# Patient Record
Sex: Female | Born: 1960 | Race: Black or African American | Hispanic: No | Marital: Married | Smoking: Former smoker
Health system: Southern US, Community
[De-identification: ages and names within clinical notes are randomized; demographics above are authoritative.]

## PROBLEM LIST (undated history)

## (undated) DIAGNOSIS — R569 Unspecified convulsions: Secondary | ICD-10-CM

## (undated) DIAGNOSIS — E119 Type 2 diabetes mellitus without complications: Secondary | ICD-10-CM

## (undated) DIAGNOSIS — I10 Essential (primary) hypertension: Secondary | ICD-10-CM

## (undated) DIAGNOSIS — E079 Disorder of thyroid, unspecified: Secondary | ICD-10-CM

## (undated) HISTORY — DX: Type 2 diabetes mellitus without complications: E11.9

---

## 1998-06-16 ENCOUNTER — Emergency Department (HOSPITAL_COMMUNITY): Admission: EM | Admit: 1998-06-16 | Discharge: 1998-06-16 | Payer: Self-pay | Admitting: Internal Medicine

## 1998-06-19 ENCOUNTER — Emergency Department (HOSPITAL_COMMUNITY): Admission: EM | Admit: 1998-06-19 | Discharge: 1998-06-19 | Payer: Self-pay | Admitting: Emergency Medicine

## 1999-07-11 ENCOUNTER — Encounter: Payer: Self-pay | Admitting: Internal Medicine

## 1999-07-11 ENCOUNTER — Emergency Department (HOSPITAL_COMMUNITY): Admission: EM | Admit: 1999-07-11 | Discharge: 1999-07-12 | Payer: Self-pay | Admitting: Internal Medicine

## 1999-09-24 ENCOUNTER — Emergency Department (HOSPITAL_COMMUNITY): Admission: EM | Admit: 1999-09-24 | Discharge: 1999-09-24 | Payer: Self-pay | Admitting: Emergency Medicine

## 1999-09-24 ENCOUNTER — Encounter: Payer: Self-pay | Admitting: Emergency Medicine

## 1999-10-23 ENCOUNTER — Emergency Department (HOSPITAL_COMMUNITY): Admission: EM | Admit: 1999-10-23 | Discharge: 1999-10-23 | Payer: Self-pay | Admitting: Emergency Medicine

## 2000-03-16 ENCOUNTER — Emergency Department (HOSPITAL_COMMUNITY): Admission: EM | Admit: 2000-03-16 | Discharge: 2000-03-17 | Payer: Self-pay | Admitting: *Deleted

## 2000-03-17 ENCOUNTER — Encounter: Payer: Self-pay | Admitting: Emergency Medicine

## 2000-11-12 ENCOUNTER — Emergency Department (HOSPITAL_COMMUNITY): Admission: EM | Admit: 2000-11-12 | Discharge: 2000-11-12 | Payer: Self-pay | Admitting: Emergency Medicine

## 2000-11-12 ENCOUNTER — Encounter: Payer: Self-pay | Admitting: Emergency Medicine

## 2001-01-30 ENCOUNTER — Emergency Department (HOSPITAL_COMMUNITY): Admission: EM | Admit: 2001-01-30 | Discharge: 2001-01-30 | Payer: Self-pay | Admitting: Emergency Medicine

## 2001-01-30 ENCOUNTER — Encounter: Payer: Self-pay | Admitting: Emergency Medicine

## 2002-01-07 ENCOUNTER — Encounter: Payer: Self-pay | Admitting: Emergency Medicine

## 2002-01-07 ENCOUNTER — Emergency Department (HOSPITAL_COMMUNITY): Admission: EM | Admit: 2002-01-07 | Discharge: 2002-01-07 | Payer: Self-pay | Admitting: Emergency Medicine

## 2002-01-22 ENCOUNTER — Encounter: Admission: RE | Admit: 2002-01-22 | Discharge: 2002-01-22 | Payer: Self-pay | Admitting: Family Medicine

## 2002-08-31 ENCOUNTER — Emergency Department (HOSPITAL_COMMUNITY): Admission: EM | Admit: 2002-08-31 | Discharge: 2002-08-31 | Payer: Self-pay | Admitting: Emergency Medicine

## 2002-09-02 ENCOUNTER — Emergency Department (HOSPITAL_COMMUNITY): Admission: EM | Admit: 2002-09-02 | Discharge: 2002-09-02 | Payer: Self-pay | Admitting: Emergency Medicine

## 2002-09-07 ENCOUNTER — Encounter: Admission: RE | Admit: 2002-09-07 | Discharge: 2002-09-07 | Payer: Self-pay | Admitting: Family Medicine

## 2002-10-22 ENCOUNTER — Encounter: Admission: RE | Admit: 2002-10-22 | Discharge: 2002-10-22 | Payer: Self-pay | Admitting: Family Medicine

## 2002-10-25 ENCOUNTER — Encounter: Admission: RE | Admit: 2002-10-25 | Discharge: 2002-10-25 | Payer: Self-pay | Admitting: Family Medicine

## 2002-12-24 ENCOUNTER — Encounter: Admission: RE | Admit: 2002-12-24 | Discharge: 2002-12-24 | Payer: Self-pay | Admitting: Family Medicine

## 2002-12-31 ENCOUNTER — Encounter (INDEPENDENT_AMBULATORY_CARE_PROVIDER_SITE_OTHER): Payer: Self-pay | Admitting: *Deleted

## 2002-12-31 ENCOUNTER — Other Ambulatory Visit: Admission: RE | Admit: 2002-12-31 | Discharge: 2002-12-31 | Payer: Self-pay | Admitting: Family Medicine

## 2002-12-31 ENCOUNTER — Encounter: Admission: RE | Admit: 2002-12-31 | Discharge: 2002-12-31 | Payer: Self-pay | Admitting: Family Medicine

## 2003-01-06 ENCOUNTER — Emergency Department (HOSPITAL_COMMUNITY): Admission: EM | Admit: 2003-01-06 | Discharge: 2003-01-06 | Payer: Self-pay | Admitting: Emergency Medicine

## 2003-01-06 ENCOUNTER — Encounter: Payer: Self-pay | Admitting: Emergency Medicine

## 2003-01-06 ENCOUNTER — Encounter: Admission: RE | Admit: 2003-01-06 | Discharge: 2003-01-06 | Payer: Self-pay | Admitting: Sports Medicine

## 2003-01-06 ENCOUNTER — Encounter: Payer: Self-pay | Admitting: Sports Medicine

## 2003-02-15 ENCOUNTER — Encounter: Admission: RE | Admit: 2003-02-15 | Discharge: 2003-02-15 | Payer: Self-pay | Admitting: Family Medicine

## 2003-03-08 ENCOUNTER — Encounter: Admission: RE | Admit: 2003-03-08 | Discharge: 2003-03-08 | Payer: Self-pay | Admitting: Family Medicine

## 2003-09-08 ENCOUNTER — Emergency Department (HOSPITAL_COMMUNITY): Admission: EM | Admit: 2003-09-08 | Discharge: 2003-09-08 | Payer: Self-pay | Admitting: Emergency Medicine

## 2003-09-08 ENCOUNTER — Encounter: Payer: Self-pay | Admitting: Emergency Medicine

## 2003-09-13 ENCOUNTER — Encounter: Admission: RE | Admit: 2003-09-13 | Discharge: 2003-09-13 | Payer: Self-pay | Admitting: Family Medicine

## 2004-01-04 ENCOUNTER — Emergency Department (HOSPITAL_COMMUNITY): Admission: EM | Admit: 2004-01-04 | Discharge: 2004-01-04 | Payer: Self-pay | Admitting: Emergency Medicine

## 2004-03-06 ENCOUNTER — Encounter: Admission: RE | Admit: 2004-03-06 | Discharge: 2004-03-06 | Payer: Self-pay | Admitting: Family Medicine

## 2004-03-23 ENCOUNTER — Emergency Department (HOSPITAL_COMMUNITY): Admission: EM | Admit: 2004-03-23 | Discharge: 2004-03-24 | Payer: Self-pay | Admitting: Emergency Medicine

## 2004-03-27 ENCOUNTER — Encounter: Admission: RE | Admit: 2004-03-27 | Discharge: 2004-03-27 | Payer: Self-pay | Admitting: Sports Medicine

## 2004-04-17 ENCOUNTER — Encounter: Admission: RE | Admit: 2004-04-17 | Discharge: 2004-05-31 | Payer: Self-pay | Admitting: Family Medicine

## 2004-08-14 ENCOUNTER — Emergency Department (HOSPITAL_COMMUNITY): Admission: EM | Admit: 2004-08-14 | Discharge: 2004-08-14 | Payer: Self-pay | Admitting: Emergency Medicine

## 2004-08-24 ENCOUNTER — Emergency Department (HOSPITAL_COMMUNITY): Admission: EM | Admit: 2004-08-24 | Discharge: 2004-08-24 | Payer: Self-pay | Admitting: Emergency Medicine

## 2004-09-25 ENCOUNTER — Ambulatory Visit: Payer: Self-pay | Admitting: Family Medicine

## 2004-09-25 ENCOUNTER — Other Ambulatory Visit: Admission: RE | Admit: 2004-09-25 | Discharge: 2004-09-25 | Payer: Self-pay | Admitting: Family Medicine

## 2004-12-25 ENCOUNTER — Ambulatory Visit: Payer: Self-pay | Admitting: Family Medicine

## 2005-02-03 ENCOUNTER — Emergency Department (HOSPITAL_COMMUNITY): Admission: EM | Admit: 2005-02-03 | Discharge: 2005-02-03 | Payer: Self-pay | Admitting: Emergency Medicine

## 2005-02-26 ENCOUNTER — Ambulatory Visit: Payer: Self-pay | Admitting: Family Medicine

## 2005-03-12 ENCOUNTER — Ambulatory Visit: Payer: Self-pay | Admitting: Family Medicine

## 2005-03-12 ENCOUNTER — Encounter: Admission: RE | Admit: 2005-03-12 | Discharge: 2005-03-12 | Payer: Self-pay | Admitting: Family Medicine

## 2005-04-03 ENCOUNTER — Encounter: Admission: RE | Admit: 2005-04-03 | Discharge: 2005-05-03 | Payer: Self-pay | Admitting: Family Medicine

## 2005-04-04 ENCOUNTER — Emergency Department (HOSPITAL_COMMUNITY): Admission: EM | Admit: 2005-04-04 | Discharge: 2005-04-04 | Payer: Self-pay | Admitting: Emergency Medicine

## 2005-04-08 ENCOUNTER — Emergency Department (HOSPITAL_COMMUNITY): Admission: EM | Admit: 2005-04-08 | Discharge: 2005-04-08 | Payer: Self-pay | Admitting: *Deleted

## 2005-07-09 ENCOUNTER — Emergency Department (HOSPITAL_COMMUNITY): Admission: EM | Admit: 2005-07-09 | Discharge: 2005-07-10 | Payer: Self-pay | Admitting: Emergency Medicine

## 2005-07-10 ENCOUNTER — Ambulatory Visit: Payer: Self-pay | Admitting: Family Medicine

## 2005-07-26 ENCOUNTER — Emergency Department (HOSPITAL_COMMUNITY): Admission: EM | Admit: 2005-07-26 | Discharge: 2005-07-26 | Payer: Self-pay | Admitting: Emergency Medicine

## 2005-08-02 ENCOUNTER — Ambulatory Visit: Payer: Self-pay | Admitting: Sports Medicine

## 2005-08-08 ENCOUNTER — Ambulatory Visit: Payer: Self-pay | Admitting: Family Medicine

## 2005-08-13 ENCOUNTER — Encounter: Admission: RE | Admit: 2005-08-13 | Discharge: 2005-08-13 | Payer: Self-pay | Admitting: Sports Medicine

## 2005-10-22 ENCOUNTER — Ambulatory Visit: Payer: Self-pay | Admitting: Family Medicine

## 2005-11-06 ENCOUNTER — Encounter (INDEPENDENT_AMBULATORY_CARE_PROVIDER_SITE_OTHER): Payer: Self-pay | Admitting: *Deleted

## 2005-11-06 LAB — CONVERTED CEMR LAB

## 2005-11-18 ENCOUNTER — Other Ambulatory Visit: Admission: RE | Admit: 2005-11-18 | Discharge: 2005-11-18 | Payer: Self-pay | Admitting: Family Medicine

## 2005-11-18 ENCOUNTER — Encounter (INDEPENDENT_AMBULATORY_CARE_PROVIDER_SITE_OTHER): Payer: Self-pay | Admitting: *Deleted

## 2005-11-18 ENCOUNTER — Ambulatory Visit: Payer: Self-pay | Admitting: Family Medicine

## 2006-02-16 ENCOUNTER — Emergency Department (HOSPITAL_COMMUNITY): Admission: EM | Admit: 2006-02-16 | Discharge: 2006-02-16 | Payer: Self-pay | Admitting: Emergency Medicine

## 2006-03-11 ENCOUNTER — Ambulatory Visit: Payer: Self-pay | Admitting: Sports Medicine

## 2006-07-07 ENCOUNTER — Ambulatory Visit: Payer: Self-pay | Admitting: Sports Medicine

## 2006-07-15 ENCOUNTER — Encounter (HOSPITAL_COMMUNITY): Admission: RE | Admit: 2006-07-15 | Discharge: 2006-08-15 | Payer: Self-pay | Admitting: Family Medicine

## 2006-08-01 ENCOUNTER — Emergency Department (HOSPITAL_COMMUNITY): Admission: EM | Admit: 2006-08-01 | Discharge: 2006-08-01 | Payer: Self-pay | Admitting: Emergency Medicine

## 2006-08-06 ENCOUNTER — Ambulatory Visit: Payer: Self-pay | Admitting: Family Medicine

## 2006-09-15 ENCOUNTER — Ambulatory Visit (HOSPITAL_BASED_OUTPATIENT_CLINIC_OR_DEPARTMENT_OTHER): Admission: RE | Admit: 2006-09-15 | Discharge: 2006-09-15 | Payer: Self-pay | Admitting: Surgery

## 2006-09-15 ENCOUNTER — Encounter (INDEPENDENT_AMBULATORY_CARE_PROVIDER_SITE_OTHER): Payer: Self-pay | Admitting: *Deleted

## 2006-10-03 ENCOUNTER — Ambulatory Visit (HOSPITAL_COMMUNITY): Admission: RE | Admit: 2006-10-03 | Discharge: 2006-10-03 | Payer: Self-pay | Admitting: Specialist

## 2006-10-03 ENCOUNTER — Encounter (INDEPENDENT_AMBULATORY_CARE_PROVIDER_SITE_OTHER): Payer: Self-pay | Admitting: *Deleted

## 2006-11-23 ENCOUNTER — Emergency Department (HOSPITAL_COMMUNITY): Admission: EM | Admit: 2006-11-23 | Discharge: 2006-11-23 | Payer: Self-pay | Admitting: Family Medicine

## 2007-01-29 DIAGNOSIS — E669 Obesity, unspecified: Secondary | ICD-10-CM | POA: Insufficient documentation

## 2007-01-30 ENCOUNTER — Encounter (INDEPENDENT_AMBULATORY_CARE_PROVIDER_SITE_OTHER): Payer: Self-pay | Admitting: *Deleted

## 2007-05-27 ENCOUNTER — Emergency Department (HOSPITAL_COMMUNITY): Admission: EM | Admit: 2007-05-27 | Discharge: 2007-05-27 | Payer: Self-pay | Admitting: Emergency Medicine

## 2007-09-01 ENCOUNTER — Emergency Department (HOSPITAL_COMMUNITY): Admission: EM | Admit: 2007-09-01 | Discharge: 2007-09-01 | Payer: Self-pay | Admitting: Emergency Medicine

## 2007-12-03 HISTORY — PX: OTHER SURGICAL HISTORY: SHX169

## 2007-12-25 ENCOUNTER — Emergency Department (HOSPITAL_COMMUNITY): Admission: EM | Admit: 2007-12-25 | Discharge: 2007-12-25 | Payer: Self-pay | Admitting: Emergency Medicine

## 2008-03-04 ENCOUNTER — Telehealth: Payer: Self-pay | Admitting: *Deleted

## 2008-03-07 ENCOUNTER — Encounter: Payer: Self-pay | Admitting: *Deleted

## 2008-03-08 ENCOUNTER — Encounter: Payer: Self-pay | Admitting: Family Medicine

## 2008-03-08 ENCOUNTER — Ambulatory Visit: Payer: Self-pay | Admitting: Sports Medicine

## 2008-03-08 LAB — CONVERTED CEMR LAB

## 2008-03-09 ENCOUNTER — Inpatient Hospital Stay (HOSPITAL_COMMUNITY): Admission: AD | Admit: 2008-03-09 | Discharge: 2008-03-10 | Payer: Self-pay | Admitting: Family Medicine

## 2008-03-09 ENCOUNTER — Telehealth: Payer: Self-pay | Admitting: Family Medicine

## 2008-03-09 ENCOUNTER — Ambulatory Visit: Payer: Self-pay | Admitting: Family Medicine

## 2008-03-09 ENCOUNTER — Telehealth: Payer: Self-pay | Admitting: *Deleted

## 2008-03-09 LAB — CONVERTED CEMR LAB
MCV: 57.6 fL — ABNORMAL LOW (ref 78.0–100.0)
RBC: 3.49 M/uL — ABNORMAL LOW (ref 3.87–5.11)
WBC: 7.8 10*3/uL (ref 4.0–10.5)

## 2008-03-10 ENCOUNTER — Encounter: Payer: Self-pay | Admitting: Family Medicine

## 2008-03-14 ENCOUNTER — Encounter: Payer: Self-pay | Admitting: *Deleted

## 2008-03-14 ENCOUNTER — Emergency Department (HOSPITAL_COMMUNITY): Admission: EM | Admit: 2008-03-14 | Discharge: 2008-03-15 | Payer: Self-pay | Admitting: Emergency Medicine

## 2008-03-16 ENCOUNTER — Ambulatory Visit: Payer: Self-pay | Admitting: Family Medicine

## 2008-04-08 ENCOUNTER — Encounter: Payer: Self-pay | Admitting: *Deleted

## 2008-05-02 ENCOUNTER — Telehealth: Payer: Self-pay | Admitting: *Deleted

## 2008-05-03 ENCOUNTER — Encounter: Payer: Self-pay | Admitting: Family Medicine

## 2008-05-03 ENCOUNTER — Telehealth: Payer: Self-pay | Admitting: Family Medicine

## 2008-05-04 ENCOUNTER — Inpatient Hospital Stay (HOSPITAL_COMMUNITY): Admission: AD | Admit: 2008-05-04 | Discharge: 2008-05-04 | Payer: Self-pay | Admitting: Gynecology

## 2008-05-16 ENCOUNTER — Emergency Department (HOSPITAL_COMMUNITY): Admission: EM | Admit: 2008-05-16 | Discharge: 2008-05-16 | Payer: Self-pay | Admitting: Emergency Medicine

## 2008-06-13 ENCOUNTER — Telehealth: Payer: Self-pay | Admitting: Family Medicine

## 2008-06-14 ENCOUNTER — Inpatient Hospital Stay (HOSPITAL_COMMUNITY): Admission: AD | Admit: 2008-06-14 | Discharge: 2008-06-14 | Payer: Self-pay | Admitting: Gynecology

## 2008-06-28 ENCOUNTER — Telehealth: Payer: Self-pay | Admitting: *Deleted

## 2008-06-29 ENCOUNTER — Ambulatory Visit: Payer: Self-pay | Admitting: Family Medicine

## 2008-06-29 LAB — CONVERTED CEMR LAB: Hemoglobin: 12.3 g/dL

## 2008-08-03 ENCOUNTER — Ambulatory Visit: Payer: Self-pay | Admitting: Obstetrics & Gynecology

## 2008-08-03 ENCOUNTER — Other Ambulatory Visit: Admission: RE | Admit: 2008-08-03 | Discharge: 2008-08-03 | Payer: Self-pay | Admitting: Obstetrics and Gynecology

## 2008-08-11 ENCOUNTER — Telehealth: Payer: Self-pay | Admitting: *Deleted

## 2008-08-18 ENCOUNTER — Encounter: Payer: Self-pay | Admitting: Obstetrics and Gynecology

## 2008-08-18 ENCOUNTER — Ambulatory Visit: Payer: Self-pay | Admitting: Obstetrics and Gynecology

## 2008-08-24 ENCOUNTER — Ambulatory Visit (HOSPITAL_COMMUNITY): Admission: RE | Admit: 2008-08-24 | Discharge: 2008-08-24 | Payer: Self-pay | Admitting: Obstetrics & Gynecology

## 2008-09-08 ENCOUNTER — Encounter: Payer: Self-pay | Admitting: Family Medicine

## 2008-09-16 ENCOUNTER — Ambulatory Visit: Payer: Self-pay | Admitting: Family Medicine

## 2008-09-16 ENCOUNTER — Encounter (INDEPENDENT_AMBULATORY_CARE_PROVIDER_SITE_OTHER): Payer: Self-pay | Admitting: *Deleted

## 2008-10-03 ENCOUNTER — Ambulatory Visit (HOSPITAL_COMMUNITY): Admission: RE | Admit: 2008-10-03 | Discharge: 2008-10-03 | Payer: Self-pay | Admitting: Obstetrics & Gynecology

## 2008-10-03 ENCOUNTER — Ambulatory Visit: Payer: Self-pay | Admitting: Obstetrics & Gynecology

## 2008-11-02 ENCOUNTER — Ambulatory Visit: Payer: Self-pay | Admitting: Obstetrics and Gynecology

## 2008-12-02 HISTORY — PX: PARATHYROIDECTOMY: SHX19

## 2008-12-02 HISTORY — PX: TOTAL THYROIDECTOMY: SHX2547

## 2008-12-25 ENCOUNTER — Emergency Department (HOSPITAL_COMMUNITY): Admission: EM | Admit: 2008-12-25 | Discharge: 2008-12-25 | Payer: Self-pay | Admitting: Family Medicine

## 2008-12-27 ENCOUNTER — Telehealth: Payer: Self-pay | Admitting: *Deleted

## 2008-12-28 ENCOUNTER — Ambulatory Visit: Payer: Self-pay | Admitting: Family Medicine

## 2008-12-28 ENCOUNTER — Encounter (INDEPENDENT_AMBULATORY_CARE_PROVIDER_SITE_OTHER): Payer: Self-pay | Admitting: Family Medicine

## 2008-12-28 LAB — CONVERTED CEMR LAB
Basophils Absolute: 0 10*3/uL (ref 0.0–0.1)
CRP, High Sensitivity: 0.6
Eosinophils Relative: 0 % (ref 0–5)
HCT: 38.5 % (ref 36.0–46.0)
Hemoglobin: 13.1 g/dL (ref 12.0–15.0)
Lymphocytes Relative: 29 % (ref 12–46)
Lymphs Abs: 2.7 10*3/uL (ref 0.7–4.0)
Monocytes Absolute: 0.6 10*3/uL (ref 0.1–1.0)
Monocytes Relative: 6 % (ref 3–12)
Neutro Abs: 6.1 10*3/uL (ref 1.7–7.7)
RBC: 5.11 M/uL (ref 3.87–5.11)
RDW: 19.2 % — ABNORMAL HIGH (ref 11.5–15.5)

## 2008-12-30 ENCOUNTER — Telehealth: Payer: Self-pay | Admitting: *Deleted

## 2009-01-06 ENCOUNTER — Ambulatory Visit: Payer: Self-pay | Admitting: Family Medicine

## 2009-01-06 ENCOUNTER — Encounter: Admission: RE | Admit: 2009-01-06 | Discharge: 2009-01-06 | Payer: Self-pay | Admitting: Family Medicine

## 2009-01-06 DIAGNOSIS — I10 Essential (primary) hypertension: Secondary | ICD-10-CM | POA: Insufficient documentation

## 2009-01-20 ENCOUNTER — Telehealth (INDEPENDENT_AMBULATORY_CARE_PROVIDER_SITE_OTHER): Payer: Self-pay | Admitting: *Deleted

## 2009-01-26 ENCOUNTER — Telehealth: Payer: Self-pay | Admitting: Family Medicine

## 2009-01-27 ENCOUNTER — Ambulatory Visit: Payer: Self-pay | Admitting: Family Medicine

## 2009-02-24 ENCOUNTER — Encounter: Payer: Self-pay | Admitting: Family Medicine

## 2009-02-24 ENCOUNTER — Ambulatory Visit: Payer: Self-pay | Admitting: Family Medicine

## 2009-02-28 ENCOUNTER — Encounter: Payer: Self-pay | Admitting: Family Medicine

## 2009-02-28 LAB — CONVERTED CEMR LAB
ALT: 12 units/L (ref 0–35)
Albumin: 4.3 g/dL (ref 3.5–5.2)
CO2: 23 meq/L (ref 19–32)
Calcium: 11.7 mg/dL — ABNORMAL HIGH (ref 8.4–10.5)
Chloride: 107 meq/L (ref 96–112)
Cholesterol: 128 mg/dL (ref 0–200)
Glucose, Bld: 114 mg/dL — ABNORMAL HIGH (ref 70–99)
Potassium: 3.2 meq/L — ABNORMAL LOW (ref 3.5–5.3)
Sodium: 142 meq/L (ref 135–145)
Total Protein: 7.3 g/dL (ref 6.0–8.3)
Triglycerides: 72 mg/dL (ref <150)
VLDL: 14 mg/dL (ref 0–40)

## 2009-03-01 ENCOUNTER — Ambulatory Visit: Payer: Self-pay | Admitting: Sports Medicine

## 2009-04-07 ENCOUNTER — Telehealth (INDEPENDENT_AMBULATORY_CARE_PROVIDER_SITE_OTHER): Payer: Self-pay | Admitting: *Deleted

## 2009-04-08 ENCOUNTER — Emergency Department (HOSPITAL_COMMUNITY): Admission: EM | Admit: 2009-04-08 | Discharge: 2009-04-08 | Payer: Self-pay | Admitting: Emergency Medicine

## 2009-04-28 ENCOUNTER — Encounter: Payer: Self-pay | Admitting: Family Medicine

## 2009-04-28 ENCOUNTER — Ambulatory Visit: Payer: Self-pay | Admitting: Family Medicine

## 2009-05-04 LAB — CONVERTED CEMR LAB
BUN: 4 mg/dL — ABNORMAL LOW (ref 6–23)
CO2: 24 meq/L (ref 19–32)
Chloride: 109 meq/L (ref 96–112)
Creatinine, Ser: 0.77 mg/dL (ref 0.40–1.20)
Potassium: 3.8 meq/L (ref 3.5–5.3)

## 2009-07-14 ENCOUNTER — Ambulatory Visit: Payer: Self-pay | Admitting: Family Medicine

## 2009-09-12 ENCOUNTER — Telehealth: Payer: Self-pay | Admitting: Family Medicine

## 2009-09-14 ENCOUNTER — Emergency Department (HOSPITAL_COMMUNITY): Admission: EM | Admit: 2009-09-14 | Discharge: 2009-09-14 | Payer: Self-pay | Admitting: Emergency Medicine

## 2009-09-18 ENCOUNTER — Ambulatory Visit: Payer: Self-pay | Admitting: Family Medicine

## 2009-09-22 ENCOUNTER — Ambulatory Visit: Payer: Self-pay | Admitting: Family Medicine

## 2010-01-13 ENCOUNTER — Emergency Department (HOSPITAL_COMMUNITY): Admission: EM | Admit: 2010-01-13 | Discharge: 2010-01-13 | Payer: Self-pay | Admitting: Emergency Medicine

## 2010-01-15 ENCOUNTER — Telehealth: Payer: Self-pay | Admitting: Family Medicine

## 2010-02-13 IMAGING — CR DG ANKLE COMPLETE 3+V*L*
4 series · 4 of 4 positions shown · non-contrast
Comparison: None

CLINICAL DATA: Left ankle pain post twisting injury

LEFT ANKLE COMPLETE - 3+ VIEW

[t ankle joint ap left]
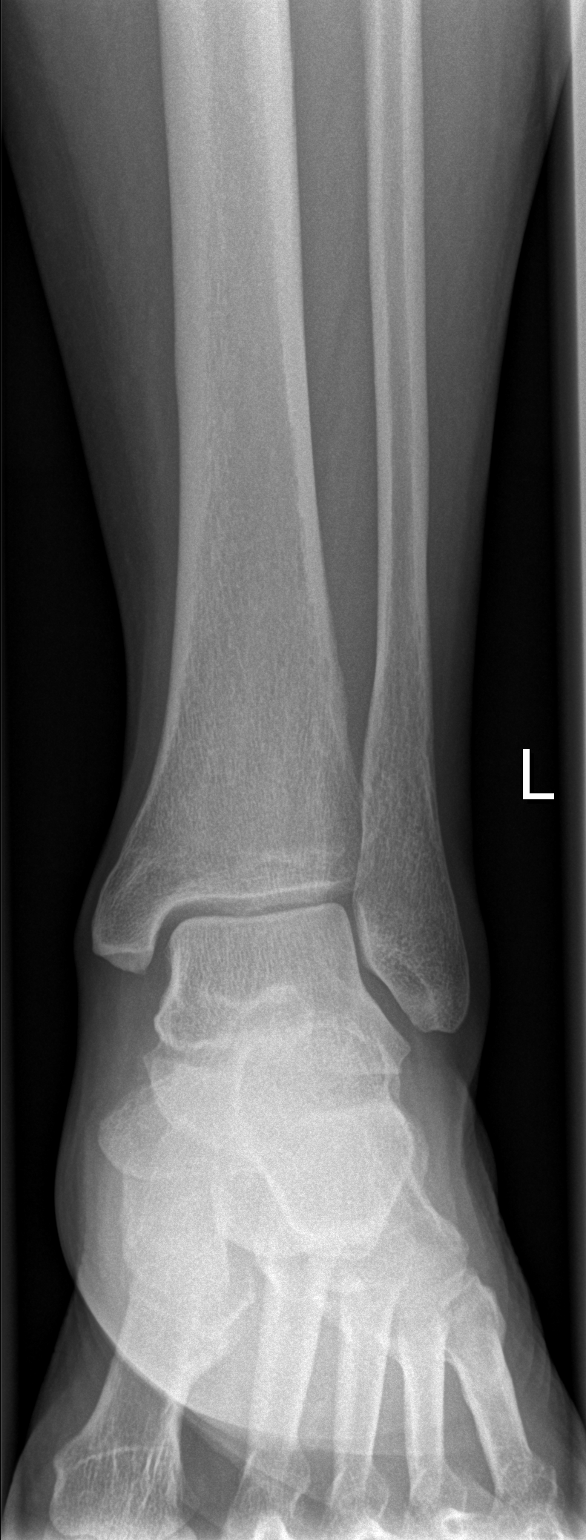

[t ankle joint oblique left]
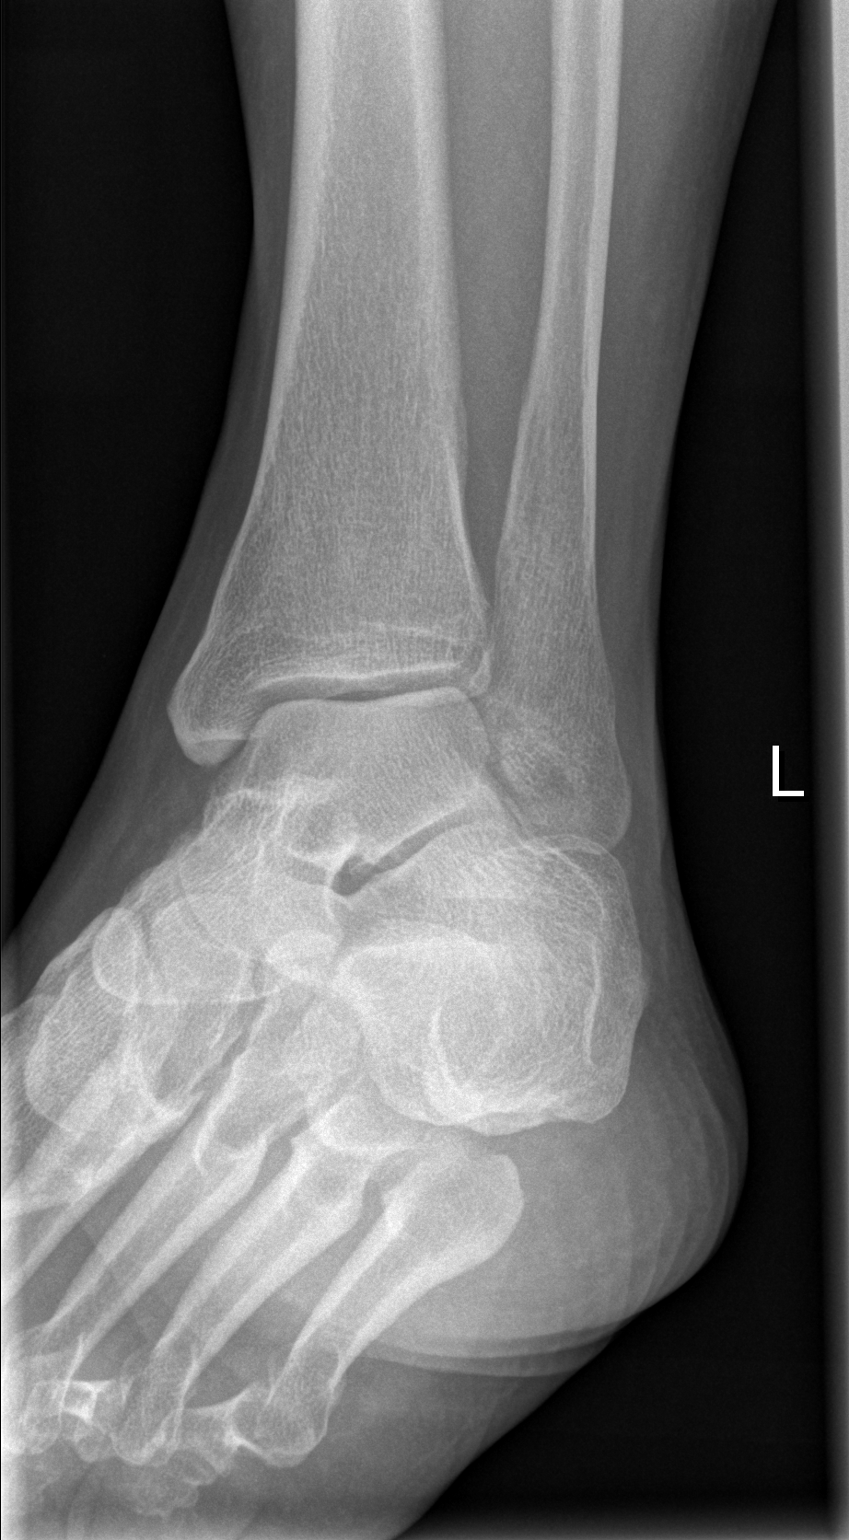

[t ankle joint lat left (1 of 2)]
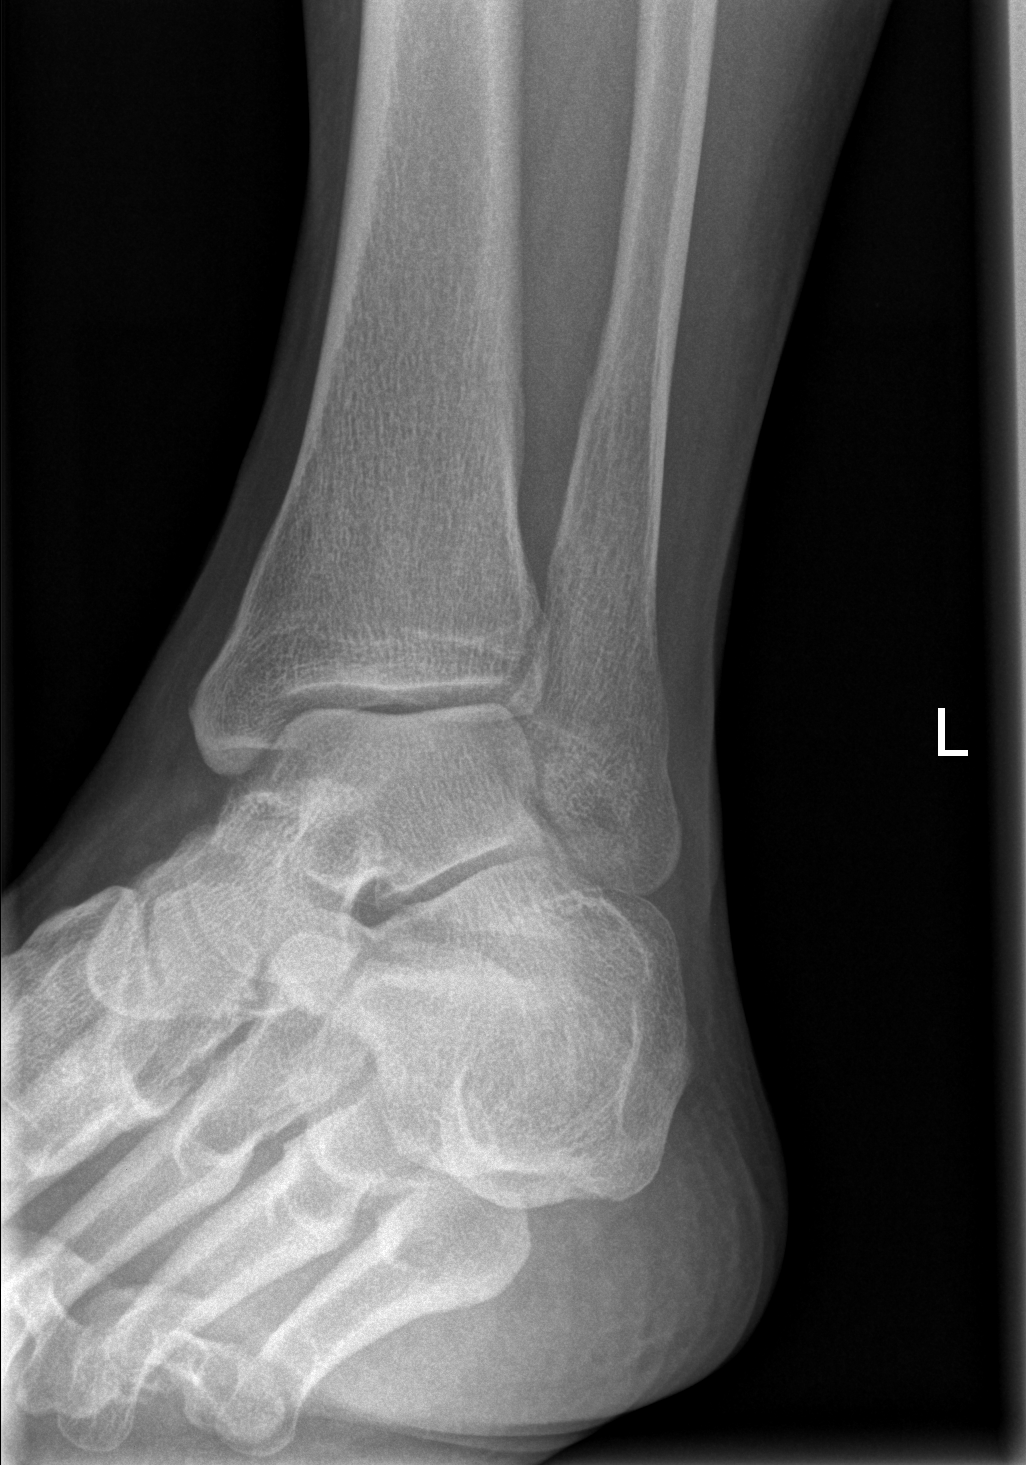

[t ankle joint lat left (2 of 2)]
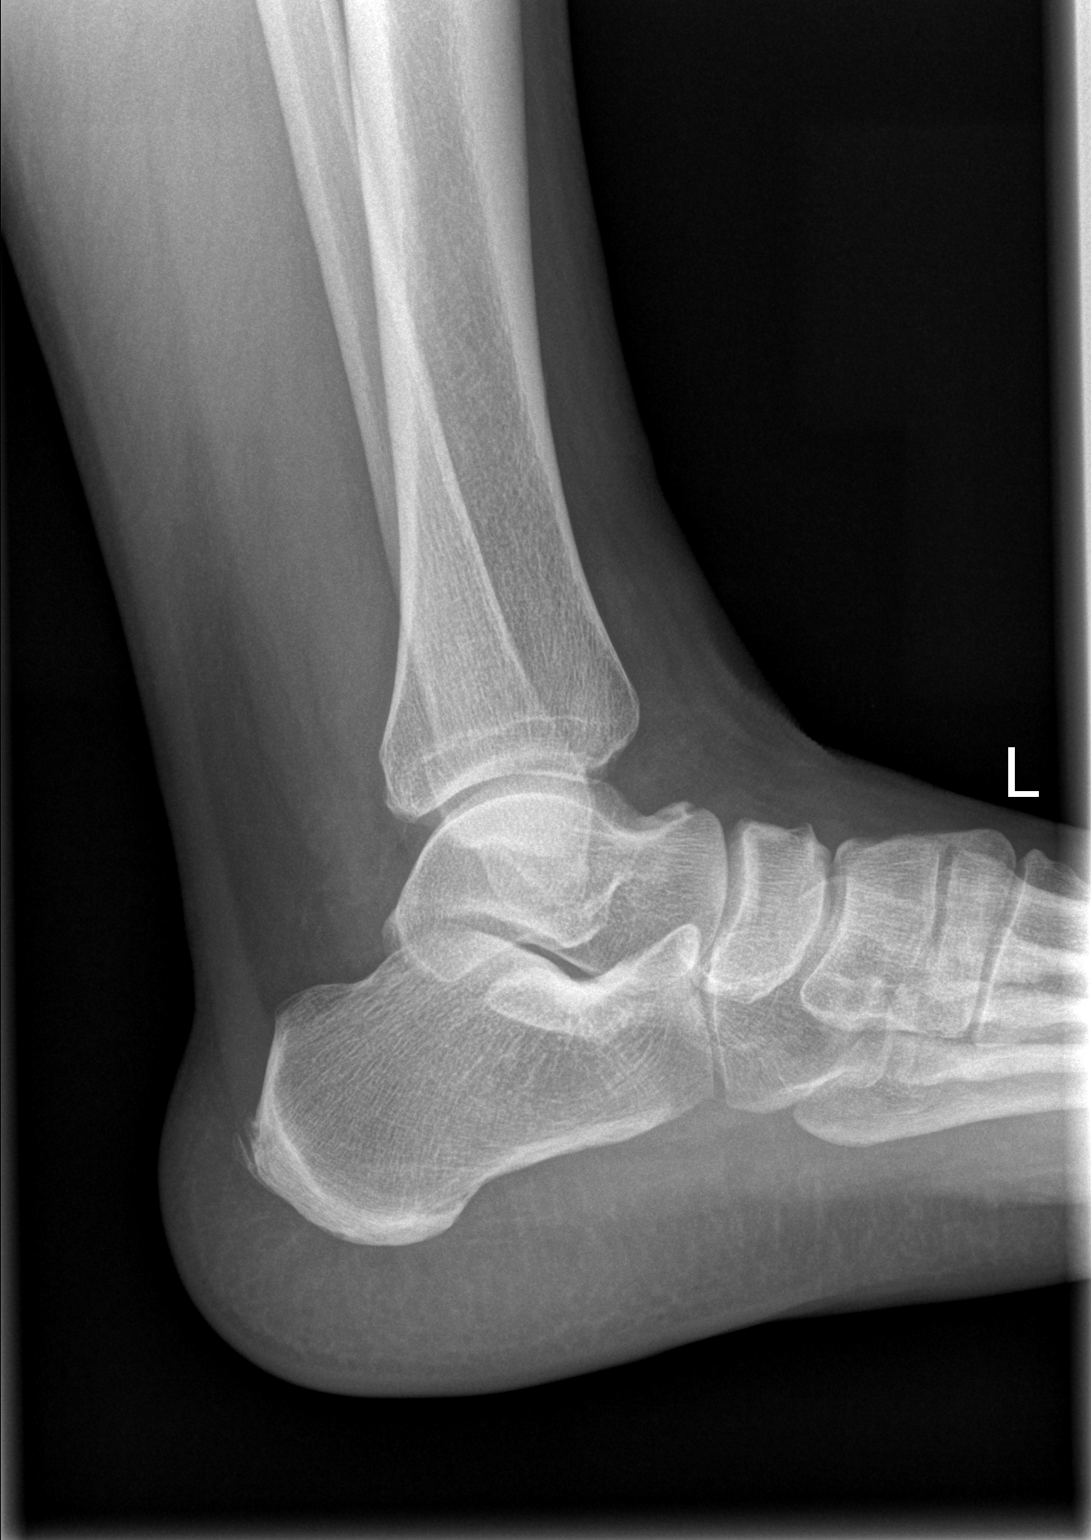

[4 of 4 positions shown; findings below may reference images not displayed]

FINDINGS: No fracture or subluxation.
IMPRESSION: No acute osseous findings

## 2010-03-12 ENCOUNTER — Emergency Department (HOSPITAL_COMMUNITY): Admission: EM | Admit: 2010-03-12 | Discharge: 2010-03-12 | Payer: Self-pay | Admitting: Emergency Medicine

## 2010-03-14 ENCOUNTER — Telehealth: Payer: Self-pay | Admitting: Family Medicine

## 2010-03-14 ENCOUNTER — Ambulatory Visit: Payer: Self-pay | Admitting: Family Medicine

## 2010-03-14 ENCOUNTER — Encounter: Payer: Self-pay | Admitting: Family Medicine

## 2010-03-30 ENCOUNTER — Telehealth: Payer: Self-pay | Admitting: Family Medicine

## 2010-04-03 ENCOUNTER — Ambulatory Visit: Payer: Self-pay | Admitting: Family Medicine

## 2010-04-16 ENCOUNTER — Telehealth: Payer: Self-pay | Admitting: Family Medicine

## 2010-04-17 ENCOUNTER — Ambulatory Visit (HOSPITAL_COMMUNITY): Admission: RE | Admit: 2010-04-17 | Discharge: 2010-04-17 | Payer: Self-pay | Admitting: Family Medicine

## 2010-05-14 ENCOUNTER — Emergency Department (HOSPITAL_COMMUNITY): Admission: EM | Admit: 2010-05-14 | Discharge: 2010-05-15 | Payer: Self-pay | Admitting: Emergency Medicine

## 2010-05-16 ENCOUNTER — Emergency Department (HOSPITAL_COMMUNITY): Admission: EM | Admit: 2010-05-16 | Discharge: 2010-05-16 | Payer: Self-pay | Admitting: Emergency Medicine

## 2010-05-16 ENCOUNTER — Ambulatory Visit: Payer: Self-pay | Admitting: Family Medicine

## 2010-05-16 ENCOUNTER — Telehealth: Payer: Self-pay | Admitting: Family Medicine

## 2010-05-16 LAB — CONVERTED CEMR LAB
AST: 20 units/L
Albumin: 3.9 g/dL
Alkaline Phosphatase: 102 units/L
BUN: 4 mg/dL
Bilirubin Urine: 1
Calcium: 11.7 mg/dL
Creatinine, Ser: 1.1 mg/dL
Glucose, Bld: 102 mg/dL
HCT: 50.5 %
Hemoglobin: 16.9 g/dL
Ketones, ur: NEGATIVE mg/dL
Nitrite: NEGATIVE
Platelets: 192 10*3/uL
Potassium: 3.1 meq/L
Protein, ur: NEGATIVE mg/dL
Urobilinogen, UA: NEGATIVE

## 2010-05-17 ENCOUNTER — Telehealth: Payer: Self-pay | Admitting: Family Medicine

## 2010-05-19 ENCOUNTER — Telehealth: Payer: Self-pay | Admitting: Family Medicine

## 2010-05-21 ENCOUNTER — Telehealth: Payer: Self-pay | Admitting: Family Medicine

## 2010-05-21 ENCOUNTER — Encounter: Payer: Self-pay | Admitting: Family Medicine

## 2010-05-22 ENCOUNTER — Ambulatory Visit: Payer: Self-pay | Admitting: Family Medicine

## 2010-05-22 ENCOUNTER — Encounter: Payer: Self-pay | Admitting: Family Medicine

## 2010-05-23 ENCOUNTER — Encounter: Payer: Self-pay | Admitting: Family Medicine

## 2010-05-24 ENCOUNTER — Encounter: Payer: Self-pay | Admitting: Family Medicine

## 2010-05-25 ENCOUNTER — Encounter: Payer: Self-pay | Admitting: Family Medicine

## 2010-05-25 DIAGNOSIS — E210 Primary hyperparathyroidism: Secondary | ICD-10-CM | POA: Insufficient documentation

## 2010-05-25 LAB — CONVERTED CEMR LAB
Calcium, Total (PTH): 12.7 mg/dL — ABNORMAL HIGH (ref 8.4–10.5)
PTH: 141 pg/mL — ABNORMAL HIGH (ref 14.0–72.0)
Phosphorus: 2.1 mg/dL — ABNORMAL LOW (ref 2.3–4.6)
TSH: 1.285 microintl units/mL (ref 0.350–4.500)
Vit D, 25-Hydroxy: 12 ng/mL — ABNORMAL LOW (ref 30–89)

## 2010-06-05 ENCOUNTER — Ambulatory Visit: Payer: Self-pay | Admitting: Family Medicine

## 2010-06-05 LAB — CONVERTED CEMR LAB
Glucose, Urine, Semiquant: NEGATIVE
Nitrite: NEGATIVE
Specific Gravity, Urine: 1.02
WBC Urine, dipstick: NEGATIVE
pH: 6.5

## 2010-06-06 ENCOUNTER — Encounter: Payer: Self-pay | Admitting: Family Medicine

## 2010-06-07 ENCOUNTER — Telehealth: Payer: Self-pay | Admitting: Family Medicine

## 2010-06-19 ENCOUNTER — Encounter: Payer: Self-pay | Admitting: Family Medicine

## 2010-06-19 ENCOUNTER — Ambulatory Visit: Payer: Self-pay | Admitting: Family Medicine

## 2010-06-20 ENCOUNTER — Encounter: Payer: Self-pay | Admitting: Family Medicine

## 2010-06-20 LAB — CONVERTED CEMR LAB
BUN: 6 mg/dL (ref 6–23)
Calcium: 12.2 mg/dL — ABNORMAL HIGH (ref 8.4–10.5)
Chloride: 110 meq/L (ref 96–112)
Creatinine, Ser: 0.94 mg/dL (ref 0.40–1.20)
Phosphorus: 2.7 mg/dL (ref 2.3–4.6)

## 2010-06-21 ENCOUNTER — Telehealth: Payer: Self-pay | Admitting: Family Medicine

## 2010-07-06 ENCOUNTER — Encounter: Payer: Self-pay | Admitting: Family Medicine

## 2010-07-06 LAB — CONVERTED CEMR LAB: PTH: 134 pg/mL

## 2010-07-11 ENCOUNTER — Encounter: Payer: Self-pay | Admitting: Family Medicine

## 2010-09-25 ENCOUNTER — Emergency Department (HOSPITAL_COMMUNITY): Admission: EM | Admit: 2010-09-25 | Discharge: 2010-09-25 | Payer: Self-pay | Admitting: Emergency Medicine

## 2010-10-17 ENCOUNTER — Ambulatory Visit: Payer: Self-pay | Admitting: Family Medicine

## 2010-10-17 ENCOUNTER — Telehealth (INDEPENDENT_AMBULATORY_CARE_PROVIDER_SITE_OTHER): Payer: Self-pay | Admitting: *Deleted

## 2010-10-17 ENCOUNTER — Observation Stay (HOSPITAL_COMMUNITY)
Admission: EM | Admit: 2010-10-17 | Discharge: 2010-10-18 | Payer: Self-pay | Source: Home / Self Care | Admitting: Emergency Medicine

## 2010-10-17 ENCOUNTER — Encounter: Payer: Self-pay | Admitting: Family Medicine

## 2010-10-18 ENCOUNTER — Encounter: Payer: Self-pay | Admitting: Family Medicine

## 2010-10-23 ENCOUNTER — Ambulatory Visit: Payer: Self-pay | Admitting: Family Medicine

## 2010-10-23 ENCOUNTER — Encounter: Payer: Self-pay | Admitting: Family Medicine

## 2010-10-23 DIAGNOSIS — E890 Postprocedural hypothyroidism: Secondary | ICD-10-CM | POA: Insufficient documentation

## 2010-10-23 LAB — CONVERTED CEMR LAB
CO2: 25 meq/L (ref 19–32)
Glucose, Bld: 124 mg/dL — ABNORMAL HIGH (ref 70–99)
Potassium: 4.1 meq/L (ref 3.5–5.3)
Sodium: 140 meq/L (ref 135–145)

## 2010-12-23 ENCOUNTER — Encounter: Payer: Self-pay | Admitting: Family Medicine

## 2010-12-25 ENCOUNTER — Telehealth: Payer: Self-pay | Admitting: Family Medicine

## 2010-12-28 ENCOUNTER — Encounter: Payer: Self-pay | Admitting: Family Medicine

## 2010-12-28 ENCOUNTER — Ambulatory Visit: Admission: RE | Admit: 2010-12-28 | Discharge: 2010-12-28 | Payer: Self-pay | Source: Home / Self Care

## 2010-12-28 DIAGNOSIS — M2550 Pain in unspecified joint: Secondary | ICD-10-CM | POA: Insufficient documentation

## 2010-12-28 LAB — CONVERTED CEMR LAB
CO2: 26 meq/L (ref 19–32)
Calcium: 9.7 mg/dL (ref 8.4–10.5)
Chloride: 103 meq/L (ref 96–112)
Creatinine, Ser: 1.2 mg/dL (ref 0.40–1.20)
Glucose, Bld: 99 mg/dL (ref 70–99)
Magnesium: 2 mg/dL (ref 1.5–2.5)
Phosphorus: 3.2 mg/dL (ref 2.3–4.6)
Sodium: 142 meq/L (ref 135–145)
Total Bilirubin: 0.6 mg/dL (ref 0.3–1.2)
Total Protein: 7.5 g/dL (ref 6.0–8.3)

## 2010-12-31 ENCOUNTER — Encounter: Payer: Self-pay | Admitting: Family Medicine

## 2011-01-01 NOTE — Assessment & Plan Note (Signed)
Summary: hfu,df   Vital Signs:  Patient profile:   50 year old female Height:      65.75 inches Weight:      205 pounds BMI:     33.46 Temp:     98.4 degrees F oral Pulse rate:   76 / minute BP sitting:   124 / 74  (left arm)  Vitals Entered By: Tessie Fass CMA (October 23, 2010 2:20 PM) CC: hospital f/u Pain Assessment Patient in pain? no        Primary Care Provider:  Luretha Murphy NP  CC:  hospital f/u.  History of Present Illness: Hospital follow up for numbness and electroyte imblalance after parathyroidectomy and tyroidectomy for primary hyperparathyroidism.  She reports that she has purchased and is taking all her medications and feeling better.  The numbness in her hands still comes and goes but overall she is feeling better.  Discussed the significance of her medical therapy and that she cannot come off of her medications for the rest of her life.  She has quit smoking.  Habits & Providers  Alcohol-Tobacco-Diet     Tobacco Status: quit  Current Medications (verified): 1)  Amlodipine Besylate 5 Mg Tabs (Amlodipine Besylate) .... One Daily 2)  Vitamin D3 2000 Unit Caps (Cholecalciferol) .... 2 Tabs Daily 3)  Calcium Carbonate 600 Mg Tabs (Calcium Carbonate) .... Tid 4)  Synthroid 150 Mcg Tabs (Levothyroxine Sodium) .... Take One Tablet Daily 5)  Klor-Con 20 Meq Pack (Potassium Chloride) .... 2 Daily  Allergies: No Known Drug Allergies  Review of Systems      See HPI General:  Denies fatigue, loss of appetite, sleep disorder, sweats, and weakness. MS:  Denies muscle weakness.  Physical Exam  General:  Looked well Lungs:  normal respiratory effort and normal breath sounds.   Heart:  normal rate, regular rhythm, and no murmur.   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 Take one tablet daily  Complete Medication List: 1)  Amlodipine Besylate 5 Mg Tabs (Amlodipine besylate) .... One daily 2)  Vitamin D3 2000 Unit Caps (Cholecalciferol) .... 2 tabs daily 3)  Calcium Carbonate 600 Mg Tabs (Calcium carbonate) .... Tid 4)  Synthroid 150 Mcg Tabs (Levothyroxine sodium) .... Take one tablet daily 5)  Klor-con 20 Meq Pack (Potassium chloride) .... 2 daily  Patient Instructions: 1)  Please schedule a follow-up appointment in 6 weeks  Prescriptions: SYNTHROID 150 MCG TABS (LEVOTHYROXINE SODIUM) Take one tablet daily  #31 x 11   Entered and Authorized by:   Luretha Murphy NP   Signed by:   Luretha Murphy NP on 10/23/2010   Method used:   Electronically to        [facility] (retail)       [address]  [place], [place]  [address]       Ph: [phone]       Fax: [phone]   RxID:   [id number]    Orders Added: 1)  [facility]- Est  Level 4 [38756]

## 2011-01-01 NOTE — Progress Notes (Signed)
Summary: meds prob  Phone Note Call from Patient Call back at Home Phone [phone]   Caller: Patient Summary of Call: went to ED for her hand and they gave her Ultram.  she read do not take it if you have seizures and she does.  needs to talk to [facility] about this Initial call taken by: De Nurse,  March 14, 2010 11:52 AM  Follow-up for Phone Call        went to ED for shoulder & hand pain. interrupts sleep. has been using ibu up until then. told her to not talke the ultram as it does lower the seizure level.  last seizure was in 10/10. wearing braces on hands. wants to be seen as pcp is not here & she has to catch the bus. will be here by 3pm. told her to resume ibu with food. states she has an appt with a hand specialist next week but needs something now Follow-up by: Golden Circle RN,  March 14, 2010 12:03 PM

## 2011-01-01 NOTE — Progress Notes (Signed)
Summary: triage  Phone Note Call from Patient Call back at Home Phone [phone]   Caller: Patient Summary of Call: would like to speak with nurse or pcp about "hurting" wants to know why hurting like this. feels like she has been doing excerises hurting on her side Initial call taken by: De Nurse,  May 17, 2010 3:33 PM  Follow-up for Phone Call        c/o soreness. states she had the MRI last night & was told she has a kidney infection. she is now on antibiotics. made her a f/u appt with pcp next friday & encouraged her to drink water. to call back if anything changes . told her we have a doctor on call when we are closed. Follow-up by: Golden Circle RN,  May 17, 2010 4:03 PM  Additional Follow-up for Phone Call Additional follow up Details #1::        CT scam showed pylo and ? calculii.  She was started on CIPRO with instructions to follow up here in 2 days. Additional Follow-up by: Luretha Murphy NP,  May 18, 2010 8:18 AM

## 2011-01-01 NOTE — Consult Note (Signed)
Summary: [facility]  [facility]   Imported By: Clydell Hakim 07/13/2010 11:19:18  _____________________________________________________________________  External Attachment:    Type:   Image     Comment:   External Document

## 2011-01-01 NOTE — Miscellaneous (Signed)
  Clinical Lists Changes  Orders: Added new Test order of Basic Met-[facility] [phone]) - Signed Added new Test order of Phosphorus-[facility] [phone]) - Signed

## 2011-01-01 NOTE — Assessment & Plan Note (Signed)
Summary: RLQ pain & vomiting/Harmony/saxon   Vital Signs:  Patient profile:   50 year old female Temp:     98.7 degrees F Pulse rate:   67 / minute BP supine:   169 / 91  (right arm)  Primary Care Provider:  Luretha Murphy NP  CC:  Abdominal Pain.  History of Present Illness: ABDOMINAL PAIN Location: LRQ Onset: Today at noon Description: 10/10 stabbing/crushing abdominal pain to back starting following a BM Modifying factors: No new medications and no other sick contacts   Symptoms Nausea/Vomiting: Yes. Vomited red vomit x1 (but had recently completed a strawberry soda) Has continued to vomit Diarrhea: Yes x1 following initial BM.  No blood Constipation: No Melena/BRBPR: No Hematemesis: No Anorexia: No Fever/Chills: + chills // - Fever Jaundice: No Dysuria: No Back pain: Yes Rash: No Weight loss: No Vaginal bleeding: No STD exposure: No NSAID use: None  PMH Past Surgeries: No abdominal and pelvic surgs.    Current Problems (verified): 1)  Abdominal Pain, Acute  (ICD-789.00) 2)  Shoulder Pain, Right  (ICD-719.41) 3)  Hand Pain, Right  (ICD-729.5) 4)  Shoulder Pain, Left  (ICD-719.41) 5)  Knee Pain, Right  (ICD-719.46) 6)  Rotator Cuff Syndrome, Left  (ICD-726.10) 7)  Ankle Sprain, Right  (ICD-845.00) 8)  Hypertension, Benign  (ICD-401.1) 9)  Leg Pain, Right  (ICD-729.5) 10)  External Hemorrhoids  (ICD-455.3) 11)  Tobacco Dependence  (ICD-305.1) 12)  Obesity, Nos  (ICD-278.00) 13)  Convulsions, Seizures, Nos  (ICD-780.39)  Current Medications (verified): 1)  Hydrochlorothiazide 12.5 Mg  Tabs (Hydrochlorothiazide) .... Take 1 Tab  By Mouth Every Morning 2)  Ibuprofen 800 Mg Tabs (Ibuprofen) .... One Two Times A Day As Needed Forpain 3)  Klor-Con 10 10 Meq Cr-Tabs (Potassium Chloride) .... Two Daily 4)  Hydrocodone-Acetaminophen 5-500 Mg Tabs (Hydrocodone-Acetaminophen) .Marland Kitchen.. 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 Okey Dupre 11/09  Family History: Last updated: 03/09/2008 DM- mother, aunt colon cancer- MGF, aunt  Social History: Last updated: 06/29/2008 Lives with husband. Smokes 1ppd (30 years). No EtoH. No illicit drugs. Unemployed.  Risk Factors: Exercise: no (02/24/2009)  Risk Factors: Smoking Status: current (04/03/2010) Packs/Day: 1.0 (04/03/2010)  Review of Systems       The patient complains of anorexia, abdominal pain, and difficulty walking.  The patient denies fever, weight loss, weight gain, vision loss, decreased hearing, hoarseness, chest pain, syncope, dyspnea on exertion, peripheral edema, prolonged cough, headaches, hemoptysis, melena, hematochezia, severe indigestion/heartburn, hematuria, incontinence, genital sores, muscle weakness, suspicious skin lesions, transient blindness, depression, unusual weight change, abnormal bleeding, and enlarged lymph nodes.    Physical Exam  General:  VS noted and BP elevated. In pain appearing woman stable. Eyes:  EOMI, no scleral icterus Mouth:  MMM Lungs:  Normal respiratory effort, chest expands symmetrically. Lungs are clear to auscultation, no crackles or wheezes. Heart:  Normal rate and regular rhythm. S1 and S2 normal without gallop, murmur, click, rub or other extra sounds. Abdomen:  Distanded appearing obese abdomen. Pt holding RLQ in wheelchair.  No bowel sounds appeciated in 2 mins of ascultation Soft with  minimal guarding. TTP radiates to RLQ Mild rebound tenderness. Less pain with palpation in distraction Mildly tender when wheel chair jostled. No masses palpated in abdomen or groin Extremities:  No clubbing, cyanosis, edema, or deformity noted.   Neurologic:  alert & oriented X3.   Cervical Nodes:  No lymphadenopathy noted Axillary Nodes:  No palpable lymphadenopathy Inguinal Nodes:  No significant adenopathy   Impression & Recommendations:  Problem # 1:  ABDOMINAL PAIN, ACUTE (ICD-789.00) Assessment New  The most likely diagnosis is:  bowel obstruction   I have considered and concluded the patient has a very low likelihood of having  ectopic pregnancy: Pelvic inflammatory disease:  Kidney stone Myocardial infarction: Pneumonia: Aortic dissection: Mesenteric ischemia: Lead poisoning: Sickle cell crisis: Black Widow Spider bite: Familial Mediterranean Fever: Porphyria: Celiac disease. Also potentially concerning for appendicitis.    Reccomend CT scan with oral and IV contrast as well as CBC, CMP and Lipase.    Plan: [facility] called for transport to ED as pt cannot get into her car.  Called ED and informed Triage nurse.  This report will be given to patient to give to triage nurse.  Will f/u patient in the inpatient setting as primar or as consult if admitted.  Discussed the case with Denny Levy, MD  Orders: Hospital Admit-[facility] (00000)  Complete Medication List: 1)  Hydrochlorothiazide 12.5 Mg Tabs (Hydrochlorothiazide) .... Take 1 tab  by mouth every morning 2)  Ibuprofen 800 Mg Tabs (Ibuprofen) .... One two times a day as needed forpain 3)  Klor-con 10 10 Meq Cr-tabs (Potassium chloride) .... Two daily 4)  Hydrocodone-acetaminophen 5-500 Mg Tabs (Hydrocodone-acetaminophen) .Marland Kitchen.. 1 tab by mouth every 4-6 hours as needed pain

## 2011-01-01 NOTE — Assessment & Plan Note (Signed)
Summary: vaginal bleeding x 2 months, heavy for past week/ls   Vital Signs:  Patient Profile:   50 Years Old Female Height:     64 inches Weight:      197.1 pounds Temp:     98.5 degrees F Pulse rate:   81 / minute BP sitting:   143 / 93  (left arm)  Pt. in pain?   yes    Location:   cramps when passes clots  Vitals Entered By: Theresia Lo RN (June 29, 2008 1:52 PM)              Is Patient Diabetic? No    5  Chief Complaint:  vaginal bleeding for 2 months.  History of Present Illness: Jenna [name] is a 50 y/o women with uterine fibroids, uterine polyp that presented with a 2 week history of increased vaginal bleeding, + blood clots noted, + changing maxi pad every 20 minutes per pt. + SOB + dizziness + lower abdominal pain. She denies fever/ chills, N/V/D/C, chest pain.  She was admitted to [facility] a few months ago with a Hgb around 6 and recieved 2 units pRBCs and an iron infusion. Since then, she has been seen at the [facility] hospital (early June) and received a Provera injection. She began spotting late June and has noted an increase in bleeding for the last 2 weeks.   I spoke with her PCP, Luretha Murphy, and was made aware of the patient's history of non-compliance. The patient is aware that she needs a hysterectomy to resolve this problem.     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       IV iron infusions 07/2006-1500 mg for PO intolerance       Seziure Disorder       G3P3   Past Surgical History:    Reviewed history from 03/09/2008 and no changes required:       Bone Scan-cellutitis - 08/14/2005       CT-brain negative - 10/21/2003       Drug Screen after seizure-neg - 10/21/2003       Right knee surgery 2007        Lipoma removed 2007       BTL  1988   Family History:    Reviewed history from 03/09/2008 and no changes required:       DM- mother, aunt       colon cancer- MGF, aunt  Social History:    Reviewed history from 03/09/2008 and no changes required:       Lives with husband. Smokes 1ppd (30 years). No EtoH. No illicit drugs. Unemployed.   Risk Factors:     Counseled to quit/cut down tobacco use:  yes    Physical Exam  General:     alert, well-nourished, well-hydrated, and normal appearance.   Head:     normocephalic and atraumatic.   Eyes:     vision grossly intact, pupils equal, pupils round, and pupils reactive to light.  EOMI. conjunctiva pale Lungs:     normal respiratory effort, no fremitus, no crackles, and no wheezes.   Heart:     RRR, + 2/6 SEM  Abdomen:     Bowel sounds positive,abdomen soft and non-tender without masses, organomegaly or hernias noted. Psych:     Cognition and judgment appear intact. Alert and cooperative with normal attention span and concentration. No apparent delusions, illusions, hallucinations.    Impression & Recommendations:  Problem # 1:  MENORRHAGIA (ICD-626.2) Assessment: Deteriorated Hx of menorrhagia with uterine fibroids and polyp. Has appointment at [facility] Sept. 2, 2009. Hgb was found to be over 12 today. Reassured patient and encouraged her to keep appointment at [facility].   Her updated medication list for this problem includes:    Provera 5 Mg Tabs (Medroxyprogesterone acetate) .Marland Kitchen... 1 by mouth x 5 days, may take 2 by mouth if need to decrease vaginal bleeding  Orders: Hemoglobin-[facility] (86578)    Problem # 2:  ANEMIA, IRON DEFICIENCY, UNSPEC. (ICD-280.9) Assessment: Improved Today, Hgb >12. Stable, continue Iron Supplementation.  Problem # 3:  FIBROIDS, UTERUS (ICD-218.9) Assessment: Deteriorated Pt will need hysterectomy to resolve menorrhagia.   Problem # 4:  TOBACCO DEPENDENCE (ICD-305.1) Assessment: Unchanged The patient was  encouraged to STOP SMOKING!  Complete Medication List: 1)  Depakote 250 Mg Tbec (Divalproex sodium) .... 3 qhs 2)  Provera 5 Mg Tabs (Medroxyprogesterone acetate) .Marland Kitchen.. 1 by mouth x 5 days, may take 2 by mouth if need to decrease vaginal bleeding   Patient Instructions: 1)  Follow up at [facility] Sept 2, 2009 2)  Take prescribed medicine as directed. 3)  Tobacco is very bad for your health and your loved ones!  4)  You Should Stop Smoking! 5)  Stop Smoking Tips: Choose a Quit date. Cut down before the quit date. Decide what you will do as a substitute when you feel the urge to smoke (gum,toothpick,exercise).   Prescriptions: PROVERA 5 MG  TABS (MEDROXYPROGESTERONE ACETATE) 1 by mouth x 5 days, may take 2 by mouth if need to decrease vaginal bleeding  #10 x 0   Entered and Authorized by:   Helane Rima MD   Signed by:   Helane Rima MD on 06/29/2008   Method used:   Handwritten   RxID:   [id number]  ]  Vital Signs:  Patient Profile:   50 Years Old Female Height:     64 inches Weight:      197.1 pounds Temp:     98.5 degrees F Pulse rate:   81 / minute BP sitting:   143 / 93    Location:   cramps when passes clots                Laboratory Results   Blood Tests   Date/Time Received: June 29, 2008 2:45  PM  Date/Time Reported: June 29, 2008 2:55 PM     CBC   HGB:  12.3 g/dL   (Normal Range: 10.2-72.5 in Males, 12.0-15.0 in Females) Comments: capillary sample ...............test performed by......Marland KitchenBonnie A. [place], [place] (ASCP)     Appended Document: vaginal bleeding x 2 months, heavy for past week/ls        Current Allergies: No known allergies         Complete Medication List: 1)  Depakote 250 Mg Tbec (Divalproex sodium) .... 3 qhs 2)  Provera 5 Mg Tabs (Medroxyprogesterone acetate) .Marland Kitchen.. 1 by mouth x 5 days, may take 2 by mouth if need to decrease vaginal bleeding    ]

## 2011-01-01 NOTE — Progress Notes (Signed)
  Phone Note Outgoing Call   Call placed by: Angeline Slim MD,  May 19, 2010 5:31 PM Call placed to: Patient Summary of Call: Was seen at [facility] for abd pain.  Was sent to ER and Dx with kidney infection.  Prescribed Cipro 500mg  bid x 7day.  Pt has taking for two Magdalene Molly, Fri).  She did not take any today.  Since then she has been feeling like she is "getting to go into seizure."  She cannot understand what other people are saying when they talk to her.  Has not passed out.  Pt has history of seizure.  She was also given Ibuprofen 800mg  for pain.  She's taken this. Also given Hydrocodone-APAP 5-325, but she has not taken any of this.   Pt was prescribed Depakote by Neuro for seizures, but has not taken in months, due to cost.    No fever.  +chills, + pain in abd when coughs, moves around, laughs.  no nausea.  no vomiting.   Looking at CT abd in [place], it seems it was concerning for pyelonephritis vs R renal infection. Since I think that pt needs to continue on Atbx and our clinic is closed, I have advised to her go to [facility] or ED with these concerns.  It could be that she is having symptoms due to lack of Depakote x months.  Pt should be seen 2/2 concerns for seizures.   Initial call taken by: Reizel Calzada MD,  May 19, 2010 5:39 PM

## 2011-01-01 NOTE — Initial Assessments (Signed)
Summary: Chest pain, arm/face numbness   Vital Signs:  Patient profile:   50 year old female O2 Sat:      98 % on Room air Temp:     97.3 degrees F oral Pulse rate:   66 / minute Resp:     22 per minute BP sitting:   185 / 100  Primary Provider:  Luretha Murphy NP   History of Present Illness: Pt is a 50 year old female 2 days postop from a parathyroidectomy at [facility].  Her surgery was uneventful and she has not yet started calcium supplimentation.  She presented today to the clinic with chest pain, shortness of breath, and facial/arm numbness and tingling.  Her symptoms began yesterday afternoon sometime after her discharge from [facility].  Her chest pain is non-exertional and exacerbated by arm movement or sternal pressure.  It is not constant and does not radiate.  Her numbness is generalized, involves all extremities, and is associated with a general feeling of weakness.  She has not had any problems swallowing or visual changes.  She was seen in the clinic and sent to the emergency department for initiation of a cardiac workup and for labs to check her calcium level.  Medications Prior to Update: 1)  Amlodipine Besylate 5 Mg Tabs (Amlodipine Besylate) .... One Daily 2)  Vitamin D3 2000 Unit Caps (Cholecalciferol) .... 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 Okey Dupre 11/09  Family History: Last updated: 03/09/2008 DM- mother, aunt colon cancer- MGF, aunt  Social History: Last updated: 05/22/2010 Lives with husband who is a Naval architect. Smokes 1ppd (30 years). No EtoH. No illicit drugs. Unemployed.  Risk Factors: Smoking Status: quit (10/17/2010) Packs/Day: 1.0 (06/05/2010)  Review of Systems       The patient complains of chest pain.  The patient denies anorexia, fever, weight loss, weight gain, vision loss, decreased hearing, hoarseness, syncope, peripheral edema, prolonged cough, headaches, hemoptysis, abdominal pain, melena, hematochezia, severe indigestion/heartburn, muscle weakness, and difficulty walking.    Physical Exam  General:  alert, well-developed, well-nourished, well-hydrated, and healthy-appearing.   Head:  normocephalic and atraumatic.   Eyes:  vision grossly intact, pupils equal, pupils round, pupils reactive to light, and corneas and lenses clear.  EOMI Mouth:  Tongue and uvula midline Neck:  supple and full ROM.  Bandage on anterior neck from surgery is CDI Lungs:  normal respiratory effort, no intercostal retractions, normal breath sounds, no crackles, and no wheezes.   Heart:  normal rate, regular rhythm, no murmur, no gallop, no rub, and no JVD.   Abdomen:  soft, non-tender, normal bowel sounds, no distention, no masses, no guarding, and no rigidity.   Msk:  normal ROM.   Pulses:  R radial normal, R posterior tibial normal, L radial normal, and L posterior tibial normal.   Neurologic:  Sensation decreased globally to both light touch and pinprick.  alert & oriented X3,  cranial nerves II-XII intact, strength normal in all extremities, and finger-to-nose normal.   Skin:  turgor normal and color normal.   Psych:  Oriented X3, memory intact for recent and remote, normally interactive, and good eye contact.    CBC: 11.4>13.4<171 CMET:  142/2.7/108/29/8/0.89<86 AST: 19, ALT: 14, Ca: 8.6 Cardiac Enzymes: POC trop <0.05 EKG: Unremarkable  ZOX:[id number] without evidence of acute cardiopulmonary disease.  Chronic peribronchial thickening and basilar scarring.   Impression & Recommendations:  Problem # 1:  CHEST PAIN, ACUTE (ICD-786.50) Pt complains of non-exertional chest pain that is intermittent, worsened by movement of her arms or sternal pressure.  Feel this is almost certainly not cardiac in nature but, considering that she is just 2 days postop, feel that a cardiac rule out is warrented.  Will draw CEs x2, EKG in the AM, and place on tele.  Problem # 2:  Hypokalemia Pt has significant hypokalemia but nothing in history really suggests a cause.  Is being repleated both IV and by mouth.  Will recheck level this PM and will replace as necessary.  This will require close followup on an outpatient basis.  Problem # 3:  HYPERTENSION, BENIGN (ICD-401.1) On recheck, patient's blood pressure has dropped to the 160s systolic.  Our goal will be to keep her systolic < 180, and diastolic <914.  She will require further followup as an outpatient to titrate medications.  Her updated medication list for this problem includes:    Amlodipine Besylate 5 Mg Tabs (Amlodipine besylate) ..... One daily  Problem # 4:  S/p parathyroidectomy Will start patient on medications that she was supposed to be on from her [facility] discarge including synthroid, oscal, and Cholecalciferol.  Will monitor calcium levels closely.  Problem # 5:  FEN/GI HHD, IVF KVO  Problem # 6:  PPx Subcutaneously heparin for VTE  Problem # 7:  Dispo Pending cardiac rule-out and normalization of electrolytes  Complete Medication List: 1)  Amlodipine Besylate 5 Mg Tabs (Amlodipine besylate) .... One daily 2)  Vitamin D3 2000 Unit Caps (Cholecalciferol) .... 2 tabs daily 3)  Oscal 500/200 D-3 500-200 Mg-unit Tabs (Calcium carbonate-vitamin d) .... Take one  tablet three times daily 4)  Synthroid 150 Mcg Tabs (Levothyroxine sodium) .... Take one tablet daily

## 2011-01-01 NOTE — Letter (Signed)
Summary: *Referral Letter/[facility] enndocrinology  [facility]  [address]   [place], [place] [address]   Phone: [phone]  Fax: [phone]    06/20/2010  Thank you in advance for agreeing to see my patient:  Jenna [name] [address] 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) 3)  SEIZURES, HX OF (ICD-V12.49) 4)  HYPERCALCEMIA (ICD-275.42) 5)  HYPERTENSION, BENIGN (ICD-401.1) 6)  EXTERNAL HEMORRHOIDS (ICD-455.3) 7)  TOBACCO DEPENDENCE (ICD-305.1) 8)  OBESITY, NOS (ICD-278.00)   Current Medications: 1)  AMLODIPINE BESYLATE 5 MG TABS (AMLODIPINE BESYLATE) one daily 2)  VITAMIN D3 2000 UNIT CAPS (CHOLECALCIFEROL) 2 tabs daily   Past Medical History: 1)  Uterine Fibroids 2)  Uterine Polyps 3)  Iron Deficiency Anemia and Menorrhagia now s/p endometrial ablation 11/09 4)  Questionable +PPDD, 10 mmm 5)  IV iron infusions 07/2006-1500 mg for PO intolerance 6)  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   Thank you again for agreeing to see our patient; please contact us if you have any further questions or need additional information.  Sincerely,  Luretha Murphy NP/Wayne A. Sheffield Slider MD

## 2011-01-01 NOTE — Progress Notes (Signed)
Summary: triage  Phone Note Call from Patient Call back at Home Phone [phone]   Caller: Patient Summary of Call: Having stomach pain. Initial call taken by: Clydell Hakim,  May 16, 2010 2:04 PM  Follow-up for Phone Call        RLQ pain around to back. vomiting. started today. denies fever. did not take anything yet. sent dtr to store for pepto. told her to come here now. asked that she be here within the next 30 minutes Follow-up by: Golden Circle RN,  May 16, 2010 2:08 PM

## 2011-01-01 NOTE — Assessment & Plan Note (Signed)
Summary: hypercalcemia, hyperparathyroidism   Vital Signs:  Patient profile:   50 year old female Weight:      202.6 pounds Temp:     98.7 degrees F oral Pulse rate:   98 / minute BP sitting:   164 / 94  (left arm) Cuff size:   large CC: productive cough x 1 week. Is Patient Diabetic? No Pain Assessment Patient in pain? no        Primary Care Provider:  Luretha Murphy NP  CC:  productive cough x 1 week.Marland Kitchen  History of Present Illness: Here for follow up of abdominal pain and hypercalcemia.  Labs indicating primary hyperparathyrodism.  Thiazide dieuretic stopped 2 weeks ago, began Vitamin D at [employer] daily.  Her pain is improved today she has two other complaints.  Cough for several days, no fever, no congestion.  Dyspareunia over the past several weeks.  History of large uterine fibroids, had ablation a few years ago.  Habits & Providers  Alcohol-Tobacco-Diet     Tobacco Status: current     Tobacco Counseling: to quit use of tobacco products     Cigarette Packs/Day: 1.0  Comments: thinks about quitting to smoke.  Current Medications (verified): 1)  Amlodipine Besylate 5 Mg Tabs (Amlodipine Besylate) .... One Daily 2)  Vitamin D3 2000 Unit Caps (Cholecalciferol) .... 2 Tabs Daily 3)  Hydromet 5-1.5 Mg/51ml Syrp (Hydrocodone-Homatropine) .... One Teaspoonful Three Times A Day As Needed Cough, 150 Cc  Allergies: No Known Drug Allergies  Review of Systems General:  Denies chills, fatigue, fever, loss of appetite, malaise, sleep disorder, sweats, weakness, and weight loss. ENT:  Complains of earache, nasal congestion, postnasal drainage, ringing in ears, sinus pressure, and sore throat. CV:  Denies chest pain or discomfort. Resp:  Complains of cough and sputum productive; denies shortness of breath and wheezing. GI:  Denies abdominal pain. GU:  Denies abnormal vaginal bleeding.  Physical Exam  General:  In no acute distress Ears:  TMs retracted and  grey Nose:  no inflammed, good air movement Mouth:  reddened small pharynx Lungs:  decreased throughout, no wheeze Heart:  normal rate and regular rhythm.   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  VItamin D added.  Has no health insurance but will need a endocrinologist and referral.  Will likely refer to [facility] as they have been very helpful in taking indigent patients.  She is to return in one month for recheck of labs and will make referral then.  Today she was very upset and crying. She was instructed to drink 10 glasses of water daily.  Increase Vitamin D to 2 tabs of 2000 International Units, cannot afford the high doseage. Orders: [facility]- Est  Level 4 ([address])  Problem # 2:  SEIZURES, HX OF (ICD-V12.49) This has been a diagnosis of history but if calciuim levels have been up could have had activity.  She has not had any spells in the past few weeks.  Was seen by neurology in past (06 time frame) with negative work up but plans to stay on Depakote, this she cannot afford and has spent more time off the medicaion than on it.  Problem # 3:  NEPHROLITHIASIS (ICD-592.0) Pelvis of kidney on CT scan indicative of such vs pylonephritis, but urine cultures at the time were negative.  Today she had a normal UA> Orders: Urinalysis-[facility] (  00000)  Problem # 4:  HYPERTENSION, BENIGN (ICD-401.1) Off thiazide, will switch to amlodipine for now. Her updated medication list for this problem includes:    Amlodipine Besylate 5 Mg Tabs (Amlodipine besylate) ..... One daily  Orders: [facility]- Est  Level 4 ([address])  Problem # 5:  TOBACCO DEPENDENCE (ICD-305.1) Counseled to quit.  Problem # 6:  COUGH (ICD-786.2) Likely viral, suspect improvement in one week, otherwise return.  Not wheezing.  Stressed to stop smoking.  Narcotic cough syrup prescribed.  Problem # 7:   DYSPAREUNIA (ICD-625.0) Will refer back to GYN if persisits.  Complete Medication List: 1)  Amlodipine Besylate 5 Mg Tabs (Amlodipine besylate) .... One daily 2)  Vitamin D3 2000 Unit Caps (Cholecalciferol) .... 2 tabs daily 3)  Hydromet 5-1.5 Mg/79ml Syrp (Hydrocodone-homatropine) .... One teaspoonful three times a day as needed cough, 150 cc  Patient Instructions: 1)  Please schedule a follow-up appointment in 1 month.  2)  Increase to 2 tabs of 2000 International Units VItamin D daily 3)  Drink 10 glasses of water a day 4)  New BP med sent in electronically 5)  Cough syrup medicine hardcopy-you will get better in 7-10 days 6)  Please try to cut down your smoking and try to quit. 7)  Please schedule a follow-up appointment in 1 month.  Prescriptions: HYDROMET 5-1.5 MG/5ML SYRP (HYDROCODONE-HOMATROPINE) one teaspoonful three times a day as needed cough, 150 cc  #1 x 0   Entered and Authorized by:   Luretha Murphy NP   Signed by:   Luretha Murphy NP on 06/05/2010   Method used:   Print then Give to Patient   RxID:   [id number] AMLODIPINE BESYLATE 5 MG TABS (AMLODIPINE BESYLATE) one daily  #30 x 3   Entered and Authorized by:   Luretha Murphy NP   Signed by:   Luretha Murphy NP on 06/05/2010   Method used:   Electronically to        [facility] (retail)       [address]       [place], [place]  [address]       Ph: [phone]       Fax: [phone]   RxID:   [id number]   Laboratory Results   Urine Tests  Date/Time Received: June 05, 2010 11:20 AM  Date/Time Reported: June 05, 2010 11:24 AM   Routine Urinalysis   Color: yellow Appearance: Clear Glucose: negative   (Normal Range: Negative) Bilirubin: negative   (Normal Range: Negative) Ketone: negative   (Normal Range: Negative) Spec. Gravity: 1.020   (Normal Range: 1.003-1.035) Blood: negative   (Normal Range: Negative) pH: 6.5   (Normal Range: 5.0-8.0) Protein: negative   (Normal  Range: Negative) Urobilinogen: 0.2   (Normal Range: 0-1) Nitrite: negative   (Normal Range: Negative) Leukocyte Esterace: negative   (Normal Range: Negative)    Comments: ...............test performed by......Marland KitchenBonnie A. [place], MLS (ASCP)cm

## 2011-01-01 NOTE — Miscellaneous (Signed)
Summary: Consent Shoulder Injection  Consent Shoulder Injection   Imported By: Clydell Hakim 03/21/2010 11:34:25  _____________________________________________________________________  External Attachment:    Type:   Image     Comment:   External Document

## 2011-01-01 NOTE — Assessment & Plan Note (Signed)
Summary: seizures/[place]   Vital Signs:  Patient profile:   49 year old female Weight:      205 pounds Pulse rate:   80 / minute BP sitting:   136 / 80  (right arm)  Vitals Entered By: Arlyss Repress CMA, (May 22, 2010 2:02 PM) CC: ? seizure activity Is Patient Diabetic? No Pain Assessment Patient in pain? no        Primary Care Provider:  Luretha Murphy NP  CC:  ? seizure activity.  History of Present Illness: ? seizure activity:  reports that on 6/15 she developed abdominal pain so presented to emergency department.  there they did workup and found the following:  UA: 1.022/ph7.5/moderate blood/ rare epi/3-6rbc-rare bacteria UCx: negative Bmet: 140/3.1/104/30/4/1.1/102 tbili 0.4, alkphos 102, ast 20, alt 21, tprot 7.3, alb 3.9, ca 11.7 CBC: 15.1>16.9/50.5<192 with 84% PMN  CT Abd/Pelvis fro 6/15:  1.  Right renal edema and perinephric stranding with enhancing   uroepithelium is concerning for right renal   infection/pyelonephritis.  Secondary differential would include the   sequelae of recent obstruction although no obstructing ureteral   calculi or bladder stones are evident.   2.  Normal appendix   3.  Bilateral simple renal cysts.   4.  Leiomyomatous uterus.  based on these findings she was dx with pyelonephritis and started on ciprofloxacin and given vicodin and ibuprofen for pain.  since then and during this whole course she's had no fevers or dysuria.  she also reports the pain persists at least somewhat diffusely across abdomen.  she reports normal urine and bowel habts.  she report no unusual vaginal discharge or concern for STD.  she no longer has menses due to endometrial ablation procedure. she also reports that the 2 times she tried taking her cipro she felt like she would pass out which is similar to her prodrome for seizures.  she never had a full blown seizure as if she has had in the past (reportedly shaking at least).  it has been years since her last seizure  despite not being on meds.  she isn't on meds for financial reasons.   Habits & Providers  Alcohol-Tobacco-Diet     Tobacco Status: current     Tobacco Counseling: to quit use of tobacco products  Current Medications (verified): 1)  Hydrochlorothiazide 12.5 Mg  Tabs (Hydrochlorothiazide) .... Take 1 Tab  By Mouth Every Morning 2)  Ibuprofen 800 Mg Tabs (Ibuprofen) .... One Two Times A Day As Needed Forpain 3)  Klor-Con 10 10 Meq Cr-Tabs (Potassium Chloride) .... Two Daily 4)  Hydrocodone-Acetaminophen 5-500 Mg Tabs (Hydrocodone-Acetaminophen) .Marland Kitchen.. 1 Tab By Mouth Every 4-6 Hours As Needed Pain  Allergies (verified): No Known Drug Allergies  Past History:  Past medical, surgical, family and social histories (including risk factors) reviewed for relevance to current acute and chronic problems.  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 Bone Scan-cellutitis - 08/14/2005 CT-brain negative - 10/21/2003 Drug Screen after seizure-neg - 10/21/2003 Right knee surgery 2007  Lipoma removed 2007 [facility] 1988 Uterine ablation procedure-Dr. Okey Dupre 11/09  Family History: Reviewed history from 03/09/2008 and no changes required. DM- mother, aunt colon cancer- MGF, aunt  Social History: Reviewed  history from 06/29/2008 and no changes required. Lives with husband who is a Naval architect. Smokes 1ppd (30 years). No EtoH. No illicit drugs. Unemployed.  Review of Systems       per HPI.  no history of kidney stones.   Physical Exam  General:  Well-developed,well-nourished,in no acute distress; alert,appropriate and cooperative  throughout examination.  obese.  VS noted and WNL Lungs:  Normal respiratory effort, chest expands symmetrically. Lungs are clear to auscultation, no crackles or wheezes. Heart:  Normal rate and regular rhythm. S1 and S2 normal without gallop, murmur, click, rub or other extra sounds. Abdomen:  soft, obese.  nondistended.  no rebound or guarding.  no hepatosplenomegaly.  diffusely mildly tender.  + BS.  no CVA tenderness   Impression & Recommendations:  Problem # 1:  ABDOMINAL PAIN, ACUTE (ICD-789.00) Assessment Unchanged  uncertain etiology.  lab results (negative UCx) and clinical picture (no fever, improving pain despite inadequate abx) do not correspond to pyelonephritis as dx by EDP and as CT scan suggests.  re-read of CT by Dr Jena Gauss of [place] states similar presentation could happen with passing of kidney stone (blood in urine, negative UCx, no fever, CT scan findings) so I suspect this is likely what she had after all.  given reaction to cipro will d/c cipro with low threshold to call back for fevers, worsening pain.   also I wonder if she has an elevated calcium causing at least part of her abdominal pain and/or a kidney stone. will work up further as below  Orders: [facility]- Est  Level 4 ([address])  Problem # 2:  HYPERCALCEMIA (ICD-275.42) Assessment: New uncertain etiology at this time and has been present for at least a few months at this time will get iCa to confirm depending if high could add on : phos, TSH, vitamin D, iPTH and possibly urine Ca given possibility of kidney stone.   Orders: Miscellaneous Lab Charge-[facility] (284[address]) [facility]- Est  Level 4 ([address])  Problem # 3:  HYPERTENSION, BENIGN (ICD-401.1) Assessment: Unchanged  encouraged to resume klorcon.  refill given.  will need repeat Bmet at next visit to make sure K has improved.  if it hasn't consider check of Magnesium level to see if mag supplement needed as well   Her updated medication list for this problem  includes:    Hydrochlorothiazide 12.5 Mg Tabs (Hydrochlorothiazide) .Marland Kitchen... Take 1 tab  by mouth every morning  Orders: [facility]- Est  Level 4 ([address])  Complete Medication List: 1)  Hydrochlorothiazide 12.5 Mg Tabs (Hydrochlorothiazide) .... Take 1 tab  by mouth every morning 2)  Ibuprofen 800 Mg Tabs (Ibuprofen) .... One two times a day as needed forpain 3)  Klor-con 10 10 Meq Cr-tabs (Potassium chloride) .... Two daily 4)  Hydrocodone-acetaminophen 5-500 Mg Tabs (Hydrocodone-acetaminophen) .Marland Kitchen.. 1 tab by mouth every 4-6 hours as needed pain  Patient Instructions: 1)  Please follow up in 2-3 weeks with Dr Humberto Seals to review labs and your pain. 2)  Resume your Klor Con for your potassium - it is low. 3)  We are going to look into the high calcium.  4)  Stop your Cipro.   Prescriptions: KLOR-CON 10 10 MEQ CR-TABS (POTASSIUM CHLORIDE) two daily  #60 x 6   Entered and Authorized by:   Ancil Boozer  MD   Signed by:   Ancil Boozer  MD on 05/22/2010   Method used:   Electronically to        [facility] (retail)  [address]       [place], [place]  [address]       Ph: [phone]       Fax: [phone]   RxID:   [id number]

## 2011-01-01 NOTE — Progress Notes (Signed)
Summary: refill  Phone Note Refill Request Call back at [phone] Message from:  Patient  Refills Requested: Medication #1:  HYDROCHLOROTHIAZIDE 12.5 MG  TABS Take 1 tab  by mouth every morning [BMN] [facility] [place]  Initial call taken by: De Nurse,  January 15, 2010 1:38 PM Caller: Patient  Follow-up for Phone Call        to pcp Follow-up by: Golden Circle RN,  January 15, 2010 1:55 PM    New/Updated Medications: HYDROCHLOROTHIAZIDE 12.5 MG  TABS (HYDROCHLOROTHIAZIDE) Take 1 tab  by mouth every morning [BMN] Prescriptions: HYDROCHLOROTHIAZIDE 12.5 MG  TABS (HYDROCHLOROTHIAZIDE) Take 1 tab  by mouth every morning Brand medically necessary #100 x 2   Entered and Authorized by:   Luretha Murphy NP   Signed by:   Luretha Murphy NP on 01/15/2010   Method used:   Electronically to        [facility] (retail)       [address]       [place], [place]  [address]       Ph: [phone]       Fax: [phone]   RxID:   [id number]

## 2011-01-01 NOTE — Miscellaneous (Signed)
Summary: BMET for K  Clinical Lists Changes Pt recently admitted, found to have hypokalemia and discharged home on by mouth KCl.  Follow up K level. Problems: Added new problem of HYPOKALEMIA (ICD-276.8) Orders: Added new Test order of Basic Met-[facility] [id number]) - Signed

## 2011-01-01 NOTE — Progress Notes (Signed)
Summary: triage  Phone Note Call from Patient Call back at Home Phone [phone]   Caller: Patient Summary of Call: Pt has leg pain and wondering what she can take? Initial call taken by: Clydell Hakim,  March 30, 2010 3:45 PM  Follow-up for Phone Call        it is the r leg that has hurt before. she wanted to know if she can take advil & ibu. told her UJ:WJXB are the same. she is going to take the ibu 600mg  two times a day with food. appt made with pcp next Tuesday. states the narcotic med helped a lot. to discuss at visit.Golden Circle RN  March 30, 2010 3:53 PM  Follow-up by: Golden Circle RN,  March 30, 2010 3:47 PM

## 2011-01-01 NOTE — Miscellaneous (Signed)
  Clinical Lists Changes  Problems: Removed problem of COUGH (ICD-786.2) - Signed Removed problem of DYSPAREUNIA (ICD-625.0) - Signed Medications: Removed medication of HYDROMET 5-1.5 MG/5ML SYRP (HYDROCODONE-HOMATROPINE) one teaspoonful three times a day as needed cough, 150 cc - Signed Orders: Added new Referral order of Endocrinology Referral (Endocrine) - Signed

## 2011-01-01 NOTE — Assessment & Plan Note (Signed)
Summary: face tingly and body has numb sensation/ls   Vital Signs:  Patient profile:   50 year old female Weight:      207.6 pounds Temp:     98.6 degrees F Pulse rate:   73 / minute BP sitting:   175 / 95  (left arm)  Vitals Entered By: Theresia Lo RN (October 17, 2010 10:05 AM) CC: sensation of numbness throughout whole body, started yesterday, thyroid surgery Monday 10/15/2010 Is Patient Diabetic? No Pain Assessment Patient in pain? no        Primary Care Provider:  Luretha Murphy NP  CC:  sensation of numbness throughout whole body, started yesterday, and thyroid surgery Monday 10/15/2010.  History of Present Illness: Pt was discharged yesterday 10/16/10 from [facility] following a removal of the parathryroid gland on Monday 11/14. She has experienced facial numbess, and tingling along with hand weakness and tingling. She is very anxious that she is having a stroke. She also notes consitpation with no BM since before the surgury as well as limited to no gas per rectum. She is taking pain medication. She also notes substernal chest pain that is not exertional. Palpation of the chest as well as arm motion exerbates the chest pain. She denies any dyspnea and the pain goes to her back.   Habits & Providers  Alcohol-Tobacco-Diet     Tobacco Status: quit     Tobacco Counseling: to remain off tobacco products  Current Problems (verified): 1)  Disturbance of Skin Sensation  (ICD-782.0) 2)  Chest Pain, Acute  (ICD-786.50) 3)  Nephrolithiasis  (ICD-592.0) 4)  Primary Hyperparathyroidism  (ICD-252.01) 5)  Seizures, Hx of  (ICD-V12.49) 6)  Hypercalcemia  (ICD-275.42) 7)  Hypertension, Benign  (ICD-401.1) 8)  External Hemorrhoids  (ICD-455.3) 9)  Tobacco Dependence  (ICD-305.1) 10)  Obesity, Nos  (ICD-278.00)  Current Medications (verified): 1)  Amlodipine Besylate 5 Mg Tabs (Amlodipine Besylate) .... One Daily 2)  Vitamin D3 2000 Unit Caps (Cholecalciferol) .... 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 Okey Dupre 11/09  Social History: Smoking Status:  quit  Review of Systems       The patient complains of chest pain and muscle weakness.  The patient denies anorexia, fever, weight loss, syncope, dyspnea on exertion, headaches, hemoptysis, abdominal pain, melena, hematochezia, severe indigestion/heartburn, depression, and unusual weight change.    Physical Exam  General:  VS noted and rechecked.  Head:  chvostek sign positive BL left > righ.  Globellar sign is neg.  normocephalic and atraumatic.   Ears:  no external deformities.   Nose:  no external deformity.   Mouth:  MMM Neck:  Incision is C/D/I Lungs:  decreased throughout, no wheeze Heart:  Normal rate and regular rhythm. S1 and S2 normal without gallop, murmur, click, rub or other extra sounds. Abdomen:  Distended hypoactive BS, Soft and non-tender no masses palpated. Extremities:  No clubbing, cyanosis, edema, or deformity noted.   Neurologic:  alert & oriented X3, cranial nerves II-XII intact, strength normal in all extremities, and sensation intact to light touch.    trousseau sign positive Cervical Nodes:  No lymphadenopathy noted Axillary Nodes:  No palpable lymphadenopathy Psych:  Oriented X3, memory intact for recent and remote,  and moderately anxious.     Impression & Recommendations:  Problem # 1:  DISTURBANCE OF SKIN SENSATION (ICD-782.0) Assessment New  Face numbness and tingling + 2 days post op removal parathyroid gland at [facility].  trousseau sign and Chovostic's signs are both positive. Suspect hypocalcemia. Pt has not started taking her oscal or tums yet.  Plan to get stat CMP and ionized calcium and treat with tums.  Will send to ED for this as well as chest pain. Gave patient option of waiting on stat labs in the clinic and she perferred transfer to the ED.  Will follow up with PCP.  Orders: [facility]- Est  Level 4 (30865)  Problem # 2:  CHEST PAIN, ACUTE (ICD-786.50) Assessment: New  Non-anginal chest pain. However pt is obest has HTN and smokes.  Will send to ED as above for EKG and cardiac enzymes.  Will follow.  Orders: [facility]- Est  Level 4 (99214)  Problem # 3:  SEIZURES, HX OF (ICD-V12.49) Assessment: Unchanged Not on any seizure medications. Think positive signs are related to calcium level and not seizure disorder.  Will follow.  Problem # 4:  HYPERTENSION, BENIGN (ICD-401.1) Assessment: Deteriorated  Has stoped taking norvasc. Will restart in the hospital. Encourage patient to refil and take her BP medications.  Her updated medication list for this problem includes:    Amlodipine Besylate 5 Mg Tabs (Amlodipine besylate) ..... One daily  Orders: [facility]- Est  Level 4 (78469)  Problem # 5:  TOBACCO DEPENDENCE (ICD-305.1)  Encouraged patient to quit when out of hospital. Will follow up with the PCP.  Orders: [facility]- Est  Level 4 (62952)  Complete Medication List: 1)  Amlodipine Besylate 5 Mg Tabs (Amlodipine besylate) .... One daily 2)  Vitamin D3 2000 Unit Caps (Cholecalciferol) .... 2 tabs daily   Orders Added: 1)  [facility]- Est  Level 4 [84132]

## 2011-01-01 NOTE — Progress Notes (Signed)
Summary: triage  Phone Note Call from Patient Call back at Home Phone [phone]   Caller: Patient Summary of Call: had thyroid surgery and her face is tingly and body numb- also quit smoking Sunday and not sure if this has anything to do with it Initial call taken by: De Nurse,  October 17, 2010 8:54 AM  Follow-up for Phone Call        patient states she had thyroid surgery Monday. stayed overnight [place] [facility] and was discharged yesterday. when the IV needle was takn out of her hand she felt her face start tingling. , then later yesterday her whole body began feeling numb. states her arms have shooting pains thru them. advised she will need to come in to be evaluated . she was given RX for synthroid and oscal and will not be able to pick up until Friday. work in appointment scheduled this AM. Follow-up by: Theresia Lo RN,  October 17, 2010 9:05 AM

## 2011-01-01 NOTE — Progress Notes (Signed)
Summary: triage  Phone Note Call from Patient Call back at Home Phone [phone]   Caller: Spouse-Douglas Summary of Call: Pt was seen in ed and given rx for antibotic.  States this is making her seizures act up. Initial call taken by: Clydell Hakim,  May 21, 2010 4:28 PM  Follow-up for Phone Call        spoke with spouse. states she went to ED for stomach pains. diagnosed with kidney infection. now having seizures. states she has a hx of seizures and used to be on depakote. states these are "not a full seizure" more like "blanking out" & being confused.  the med is on cipro. spoke with Dr. Leveda Anna. to continue the cipro until seen. may be acting differently due to her infection. spous agreed to keep giving her dose as directed. told him we always have a md on call at night for any questions or concerns. Follow-up by: Golden Circle RN,  May 21, 2010 4:40 PM  Additional Follow-up for Phone Call Additional follow up Details #1::        Reviewed e-chart back to 2005, no seizures reported on ER notes.  Did have menorrhagia and severe anemia, and multiple injuries.  Since she has been coming here, she has not been on Depakote for any length of time and has been seizure free.  She did have a work up with referral to neuology in 2005, will have her sign records release from [facility] when she comes in on 6/24 Additional Follow-up by: Luretha Murphy NP,  May 22, 2010 9:14 AM

## 2011-01-01 NOTE — Assessment & Plan Note (Signed)
Summary: leg pain/[place]   Vital Signs:  Patient profile:   50 year old female Height:      65.75 inches Weight:      207 pounds BMI:     33.79 Temp:     98.1 degrees F oral Pulse rate:   93 / minute BP sitting:   170 / 98  (left arm)  Vitals Entered By: Theresia Lo RN (Apr 03, 2010 2:33 PM) CC: right knee, thigh and sometimes pain below knee, ankle Is Patient Diabetic? No   Primary Care Provider:  Luretha Murphy NP  CC:  right knee, thigh and sometimes pain below knee, and ankle.  History of Present Illness: RIght knee pain.  Has seen [facility] for right hand pain and right shoulder pain.  Shoulder injection has improved her pain.  She was referred to had surgeon but has not insurance and they wanted $2000 up front to be seen.  Right knee swells and is stiff after exercises.  Has been trying to dance and joints seem to get worse.   Has not been taking BP medicaions at all.  Has not been taking seizure meds for some time.    Habits & Providers  Alcohol-Tobacco-Diet     Tobacco Status: current     Tobacco Counseling: to quit use of tobacco products     Cigarette Packs/Day: 1.0  Current Medications (verified): 1)  Hydrochlorothiazide 12.5 Mg  Tabs (Hydrochlorothiazide) .... Take 1 Tab  By Mouth Every Morning 2)  Ibuprofen 800 Mg Tabs (Ibuprofen) .... One Two Times A Day As Needed Forpain 3)  Klor-Con 10 10 Meq Cr-Tabs (Potassium Chloride) .... Two Daily 4)  Hydrocodone-Acetaminophen 5-500 Mg Tabs (Hydrocodone-Acetaminophen) .Marland Kitchen.. 1 Tab By Mouth Every 4-6 Hours As Needed Pain  Allergies (verified): No Known Drug Allergies  Review of Systems      See HPI MS:  Complains of joint pain, joint swelling, and stiffness.  Physical Exam  General:  Appears to have gained weight Msk:  Right knee with mild effusion, + buldge test.   Pain over lateral joint line.  Good stability, + crepitus.  Attempted tap, did not obtain any fluid; injected 40 mg Kenalog and 3 cc of marcaine into knee  joint space.  Patient tolerated procedure well.  + tinels and phalens, + pain on palpation of flexor tendon of thumb.   Impression & Recommendations:  Problem # 1:  KNEE PAIN, RIGHT (ICD-719.46) Injected 40 mg Kenalog and 3 cc long acting local into knee joint space. Her updated medication list for this problem includes:    Ibuprofen 800 Mg Tabs (Ibuprofen) ..... One two times a day as needed forpain    Hydrocodone-acetaminophen 5-500 Mg Tabs (Hydrocodone-acetaminophen) .Marland Kitchen... 1 tab by mouth every 4-6 hours as needed pain  Orders: [facility]- Est Level  3 (91478)  Problem # 2:  HAND PAIN, RIGHT (ICD-729.5) splinted, unable to afford specialist care Orders: [facility]- Est Level  3 (29562)  Problem # 3:  HYPERTENSION, BENIGN (ICD-401.1) Will resume BP meds and return for check in one month Her updated medication list for this problem includes:    Hydrochlorothiazide 12.5 Mg Tabs (Hydrochlorothiazide) .Marland Kitchen... Take 1 tab  by mouth every morning  Complete Medication List: 1)  Hydrochlorothiazide 12.5 Mg Tabs (Hydrochlorothiazide) .... Take 1 tab  by mouth every morning 2)  Ibuprofen 800 Mg Tabs (Ibuprofen) .... One two times a day as needed forpain 3)  Klor-con 10 10 Meq Cr-tabs (Potassium chloride) .... Two daily 4)  Hydrocodone-acetaminophen 5-500 Mg Tabs (Hydrocodone-acetaminophen) .Marland Kitchen.. 1 tab by mouth every 4-6 hours as needed pain  Patient Instructions: 1)  Ice your knee four times a day for 3-4 days and then as needed 2)  Use ibuprofen three times a day for 3-4 days 3)  do not exercise for one week 4)  Call me in 2 weeks if you are not improved 5)  Wear the wrist splint at night mostly or when sleeping 6)  Please schedule a follow-up appointment in 1 month.  Prescriptions: HYDROCODONE-ACETAMINOPHEN 5-500 MG TABS (HYDROCODONE-ACETAMINOPHEN) 1 tab by mouth every 4-6 hours as needed pain Brand medically necessary #20 x 0   Entered and Authorized by:   Luretha Murphy NP   Signed by:   Luretha Murphy  NP on 04/03/2010   Method used:   Print then Give to Patient   RxID:   [id number] HYDROCHLOROTHIAZIDE 12.5 MG  TABS (HYDROCHLOROTHIAZIDE) Take 1 tab  by mouth every morning Brand medically necessary #30 x 11   Entered and Authorized by:   Luretha Murphy NP   Signed by:   Luretha Murphy NP on 04/03/2010   Method used:   Print then Give to Patient   RxID:   [id number]    Prevention & Chronic Care Immunizations   Influenza vaccine: Not documented    Tetanus booster: 01/09/2004: given   Tetanus booster due: 01/08/2014    Pneumococcal vaccine: Not documented  Other Screening   Pap smear: NEGATIVE FOR INTRAEPITHELIAL LESIONS OR MALIGNANCY.  (08/18/2008)   Pap smear action/deferral: Deferred-3 yr interval  (04/03/2010)   Pap smear due: 08/18/2009    Mammogram: Done.  (12/02/2002)   Mammogram due: 12/03/2003   Smoking status: current  (04/03/2010)   Smoking cessation counseling: yes  (01/27/2009)  Lipids   Total Cholesterol: 128  (02/24/2009)   LDL: 71  (02/24/2009)   LDL Direct: Not documented   HDL: 43  (02/24/2009)   Triglycerides: 72  (02/24/2009)  Hypertension   Last Blood Pressure: 170 / 98  (04/03/2010)   Serum creatinine: 0.77  (04/28/2009)   Serum potassium 3.8  (04/28/2009)    Hypertension flowsheet reviewed?: Yes   Progress toward BP goal: Deteriorated  Self-Management Support :    Hypertension self-management support: Not documented

## 2011-01-01 NOTE — Miscellaneous (Signed)
Summary: update problem list  Clinical Lists Changes  Problems: Removed problem of ABDOMINAL PAIN, ACUTE (ICD-789.00) Removed problem of CONVULSIONS, SEIZURES, NOS (ICD-780.39) Added new problem of SEIZURES, HX OF (ICD-V12.49) Added new problem of PRIMARY HYPERPARATHYROIDISM (ICD-252.01) Added new problem of NEPHROLITHIASIS (ICD-592.0)

## 2011-01-01 NOTE — Progress Notes (Signed)
Summary: returning call  Phone Note Call from Patient Call back at Home Phone [phone]   Reason for Call: Talk to Doctor Summary of Call: pt is returning Graham Hyun's call Initial call taken by: Knox Royalty,  June 21, 2010 9:00 AM  Follow-up for Phone Call        spoke with patient and gave her information about appointment that ahs been scheduled at [facility].  Follow-up by: Theresia Lo RN,  June 21, 2010 10:15 AM

## 2011-01-01 NOTE — Miscellaneous (Signed)
  Clinical Lists Changes  Observations: Added new observation of PTH, INTACT: 134 pg/mL (07/11/2010 16:12) Added new observation of PTH, INTACT: 134 pg/mL (07/06/2010 16:12)

## 2011-01-01 NOTE — Progress Notes (Signed)
Summary: triage  Phone Note Call from Patient Call back at Home Phone [phone]   Caller: Patient Summary of Call: Pt wondering if she can still take her potissum pills with her bp pills? Initial call taken by: Clydell Hakim,  Apr 16, 2010 3:57 PM  Follow-up for Phone Call        told her yes & explained why. discussed her efforts to lose weight & stop smoking. gave suggestions.  states the upper body exercises help her figure.  reiterated icing knee after exercises. states she has been having short absence seizures during the day. wants to go back on the depacote. owes $800 so cannot go back to neurologist. will send to [facility] & ask if she wants her back on med or if will need another appt. Follow-up by: Golden Circle RN,  Apr 16, 2010 4:00 PM

## 2011-01-01 NOTE — Assessment & Plan Note (Signed)
Summary: hand & shoulder pain/Coats/saxon   Vital Signs:  Patient profile:   50 year old female Height:      65.75 inches Weight:      210 pounds BMI:     34.28 Temp:     98.3 degrees F oral Pulse rate:   98 / minute BP sitting:   159 / 97  (left arm) Cuff size:   regular  Vitals Entered By: Tessie Fass CMA (March 14, 2010 3:15 PM) CC: right hand and shoulder pain x 2 weeks Is Patient Diabetic? No Pain Assessment Patient in pain? yes     Location: right shoulder and hand Intensity: 10   Primary Care Provider:  Luretha Murphy NP  CC:  right hand and shoulder pain x 2 weeks.  History of Present Illness: 50 y/o F here for:  Right shoulder pain x 2 wks: which has worsened over the weekend.  Pt denies trauma, but states that she has been exercising more often in past 2 wks.  She also teaches dance class.  She also recalled cleaning her carpet recently.  Now R shoulder is painful to touch and pt cannot lift or raise arm.  -swelling, -warmth.  We went to the ER 2 days ago and was given Rx for ultram and muscle relaxant. States she cannot take ultram due to seizure disorder.  Ibuprofen 800mg  not helping.  Pain keeping pt up at night.   R hand swelling x 2 wks:  Was told that she has carpal tunnel.  She cannot make a fist.  She has appt with hand specialist on Monday.        Habits & Providers  Alcohol-Tobacco-Diet     Tobacco Status: current     Tobacco Counseling: to quit use of tobacco products     Cigarette Packs/Day: 1.0  Current Medications (verified): 1)  Hydrochlorothiazide 12.5 Mg  Tabs (Hydrochlorothiazide) .... Take 1 Tab  By Mouth Every Morning 2)  Ibuprofen 800 Mg Tabs (Ibuprofen) .... One Two Times A Day As Needed Forpain 3)  Klor-Con 10 10 Meq Cr-Tabs (Potassium Chloride) .... Two Daily 4)  Hydrocodone-Acetaminophen 5-500 Mg Tabs (Hydrocodone-Acetaminophen) .Marland Kitchen.. 1 Tab By Mouth Every 4-6 Hours As Needed Pain  Allergies (verified): No Known Drug Allergies  Past  History:  Past Medical History: Last updated: 02/24/2009 Uterine Fibroids Uterine Polyps Iron Deficiency Anemia Menorrhagia Questionable +PPDD, 10 mmm IV iron infusions 07/2006-1500 mg for PO intolerance Seziure Disorder G3P3  endometrial ablation 11/09  Past Surgical History: Last updated: 01/06/2009 Bone Scan-cellutitis - 08/14/2005 CT-brain negative - 10/21/2003 Drug Screen after seizure-neg - 10/21/2003 Right knee surgery 2007  Lipoma removed 2007 BTL 1988 Uterine ablation procedure-Dr. Okey Dupre 11/09  Family History: Last updated: 03/09/2008 DM- mother, aunt colon cancer- MGF, aunt  Social History: Last updated: 06/29/2008 Lives with husband. Smokes 1ppd (30 years). No EtoH. No illicit drugs. Unemployed.  Risk Factors: Smoking Status: current (03/14/2010) Packs/Day: 1.0 (03/14/2010)  Social History: Packs/Day:  1.0  Review of Systems MS:  Complains of joint pain, joint swelling, and loss of strength; denies low back pain, mid back pain, muscle aches, muscle, cramps, muscle weakness, stiffness, and thoracic pain.  Physical Exam  General:  Well-developed,well-nourished,in no acute distress; alert,appropriate and cooperative throughout examination Msk:  Consent obtained and verified. Sterile betadine prep. Furthur cleansed with alcohol. Topical analgesic spray: Ethyl chloride. Joint: R shoulder/ Acrominal Approached in typical fashion with: Completed without difficulty Meds: 1cc kenolog 40, 4cc lidocaine +1%epi Needle: 22 gauge, 1 1/2 inch  Aftercare instructions and Red flags advised.   Extremities:  Right Shoulder: Inspection reveals no abnormalities, atrophy or asymmetry. Palpation + tenderness over AC joint  Fully decreased ROM all planes. Rotator cuff strength normal throughout. + signs of impingement with + Neer and Hawkin's tests, +empty can. Normal scapular function observed. No painful arc and no drop arm sign.  Right hand: pain with flexion and  extension of wrist.  decreased strength, +radial pulse, -redness, -warmth, -muscle atrophy, -swelling.  -wrist drop. + Tinel's test, +CTC compression test       Impression & Recommendations:  Problem # 1:  SHOULDER PAIN, RIGHT (ICD-719.41) Assessment New Pain most likely not frozen shoulder.  Inflammation May be aggravated by rug cleaning.  Injection with Kenolog done today. Dr Sheffield Slider precepting.  Pt had full ROM after injection.  Hydrocodone #10 tab given for as needed use for the next 2 days.  Advised to ease into exercises but to not use sling.   Her updated medication list for this problem includes:     Hydrocodone-acetaminophen 5-500 Mg Tabs (Hydrocodone-acetaminophen) .Marland Kitchen... 1 tab by mouth every 4-6 hours as needed pain  Orders: Injection, large joint- [facility] (20610)  Problem # 2:  HAND PAIN, RIGHT (ICD-729.5) Assessment: New May be CTS, but pt is seeing hand specialist on Monday.  Will defer.  Advised pt to continue wearing brace to sleep in.   Orders: [facility]- Est Level  3 (34742)  Complete Medication List: 1)  Hydrochlorothiazide 12.5 Mg Tabs (Hydrochlorothiazide) .... Take 1 tab  by mouth every morning 2)  Ibuprofen 800 Mg Tabs (Ibuprofen) .... One two times a day as needed forpain 3)  Klor-con 10 10 Meq Cr-tabs (Potassium chloride) .... Two daily 4)  Hydrocodone-acetaminophen 5-500 Mg Tabs (Hydrocodone-acetaminophen) .Marland Kitchen.. 1 tab by mouth every 4-6 hours as needed pain  Patient Instructions: 1)  Please schedule a follow-up appointment in 2 weeks if not better.  2)  You should keep the appointment with the hand specialist. 3)  If you have fever, swelling, or worse pain after injection, please give our office a call.  4)  I've sent a Rx for #10 Tabs of vicodin for pain control for the next two days.   Prescriptions: HYDROCODONE-ACETAMINOPHEN 5-500 MG TABS (HYDROCODONE-ACETAMINOPHEN) 1 tab by mouth every 4-6 hours as needed pain  #10 x 0   Entered and Authorized by:   Angeline Slim MD    Signed by:   Angeline Slim MD on 03/14/2010   Method used:   Print then Give to Patient   RxID:   [id number]

## 2011-01-01 NOTE — Miscellaneous (Signed)
  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

## 2011-01-01 NOTE — Miscellaneous (Signed)
Summary: med list change  Clinical Lists Changes  Medications: Removed medication of HYDROCHLOROTHIAZIDE 12.5 MG  TABS (HYDROCHLOROTHIAZIDE) Take 1 tab  by mouth every morning [BMN] Removed medication of KLOR-CON 10 10 MEQ CR-TABS (POTASSIUM CHLORIDE) two daily

## 2011-01-01 NOTE — Progress Notes (Signed)
Summary: phn msg  Phone Note Call from Patient Call back at Home Phone [phone]   Caller: Patient Summary of Call: pt is calling to [facility] - was told to call today Initial call taken by: De Nurse,  June 07, 2010 12:13 PM  Follow-up for Phone Call        Called to speak with husband, about hyperparathyroidism Follow-up by: Luretha Murphy NP,  June 07, 2010 1:45 PM

## 2011-01-03 ENCOUNTER — Emergency Department (HOSPITAL_COMMUNITY)
Admission: EM | Admit: 2011-01-03 | Discharge: 2011-01-03 | Disposition: A | Discharge status: Home / Self Care | Payer: Self-pay | Attending: Emergency Medicine | Admitting: Emergency Medicine

## 2011-01-03 DIAGNOSIS — Z79899 Other long term (current) drug therapy: Secondary | ICD-10-CM | POA: Insufficient documentation

## 2011-01-03 DIAGNOSIS — I10 Essential (primary) hypertension: Secondary | ICD-10-CM | POA: Insufficient documentation

## 2011-01-03 DIAGNOSIS — G40909 Epilepsy, unspecified, not intractable, without status epilepticus: Secondary | ICD-10-CM | POA: Insufficient documentation

## 2011-01-03 DIAGNOSIS — K047 Periapical abscess without sinus: Secondary | ICD-10-CM | POA: Insufficient documentation

## 2011-01-03 DIAGNOSIS — K089 Disorder of teeth and supporting structures, unspecified: Secondary | ICD-10-CM | POA: Insufficient documentation

## 2011-01-03 DIAGNOSIS — R51 Headache: Secondary | ICD-10-CM | POA: Insufficient documentation

## 2011-01-03 DIAGNOSIS — E039 Hypothyroidism, unspecified: Secondary | ICD-10-CM | POA: Insufficient documentation

## 2011-01-03 NOTE — Progress Notes (Signed)
Summary: Rx  Phone Note Refill Request Call back at Home Phone [phone]   pt needs new rx for ibuprofen 800 mg, rx is expired, pt goes to [facility]/[address]  Initial call taken by: Knox Royalty,  December 25, 2010 12:18 PM  Follow-up for Phone Call        done Follow-up by: Luretha Murphy NP,  December 25, 2010 1:49 PM    New/Updated Medications: IBUPROFEN 800 MG TABS (IBUPROFEN) one three times a day as needed pain Prescriptions: IBUPROFEN 800 MG TABS (IBUPROFEN) one three times a day as needed pain  #90 x 2   Entered and Authorized by:   Luretha Murphy NP   Signed by:   Luretha Murphy NP on 12/25/2010   Method used:   Electronically to        [facility] (retail)       [address]       [place], [place]  [address]       Ph: [phone]       Fax: [phone]   RxID:   [id number]

## 2011-01-03 NOTE — Assessment & Plan Note (Signed)
Summary: numbness in arm & leg,df   Vital Signs:  Patient profile:   50 year old female Weight:      215.5 pounds BMI:     35.17 Pulse rate:   65 / minute BP sitting:   130 / 87  (right arm)  Vitals Entered By: Arlyss Repress CMA, (December 28, 2010 1:51 PM) CC: tingling in hands and feet started in nov 2011. hands feel numb. Is Patient Diabetic? No Pain Assessment Patient in pain? no        Primary Care Provider:  Luretha Murphy NP  CC:  tingling in hands and feet started in nov 2011. hands feel numb.Marland Kitchen  History of Present Illness: Hands feel numb but she is able to use them.  They hurt sometimes, especially her middle left finger.  She feels tired and has not energy.  Her joints hurt.  She has not been back to [facility] to follow up on her parathyroidectomy and radioactive thyroid ablation.  She has quit smoking.  Habits & Providers  Alcohol-Tobacco-Diet     Tobacco Status: quit < 6 months     Tobacco Counseling: not to resume use of tobacco products  Allergies: No Known Drug Allergies  Social History: Smoking Status:  quit < 6 months  Review of Systems      See HPI  Physical Exam  General:  Well-developed,well-nourished,in no acute distress; alert,appropriate and cooperative throughout examination Msk:  no joint tenderness, no joint swelling, and no joint warmth.   Neurologic:  alert & oriented X3, strength normal in all extremities, and sensation intact to light touch.     Impression & Recommendations:  Problem # 1:  HYPOTHYROIDISM, POSTSURGICAL (ICD-244.0)  Her updated medication list for this problem includes:    Synthroid 150 Mcg Tabs (Levothyroxine sodium) .Marland Kitchen... 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 One daily  Problem # 4:  OBESITY, NOS (ICD-278.00) counsleled on weight loss  Complete Medication List: 1)  Amlodipine Besylate 5 Mg Tabs (Amlodipine besylate) .... One daily 2)  Vitamin D3 2000 Unit Caps (Cholecalciferol) .... 2 tabs daily 3)  Calcium Carbonate 600 Mg Tabs (Calcium carbonate) .... Tid 4)  Synthroid 150 Mcg Tabs (Levothyroxine sodium) .... Take one tablet daily 5)  Ibuprofen 800 Mg Tabs (Ibuprofen) .... One three times a day as needed pain  Other Orders: Sed Rate (ESR)-[facility] [id number])  Patient Instructions: 1)  Stay away from sugars and starches 2)  Labs 3)  Use aspercream to hand, can use up to four times a day 4)  Drink water-5-6 glasses a day 5)  Please schedule a follow-up appointment in 3 months .    Orders Added: 1)  Comp Met-[facility] [[id number]] 2)  Phosphorus-[facility] [[id number]] 3)  Magnesium-[facility] [[id number]] 4)  Sed Rate (ESR)-[facility] [85651] 5)  TSH-[facility] [[id number]] 6)  [facility]- Est Level  3 [51884]  Appended Document: Sedrate  3 mm/hr    Lab Visit  Laboratory Results   Blood Tests     SED rate: 3  mm/hr  Comments: ...............test performed by......Marland KitchenBonnie A. [place], MLS (ASCP)cm    Orders Today:

## 2011-01-09 ENCOUNTER — Encounter: Payer: Self-pay | Admitting: *Deleted

## 2011-01-09 ENCOUNTER — Telehealth: Payer: Self-pay | Admitting: Family Medicine

## 2011-01-09 NOTE — Miscellaneous (Signed)
  Clinical Lists Changes TSH elevated, patient reports not missing any doses, increast to 200 mcg Medications: Changed medication from SYNTHROID 150 MCG TABS (LEVOTHYROXINE SODIUM) Take one tablet daily to SYNTHROID 200 MCG TABS (LEVOTHYROXINE SODIUM) one daily - Signed Changed medication from VITAMIN D3 2000 UNIT CAPS (CHOLECALCIFEROL) 2 tabs daily to VITAMIN D3 2000 UNIT CAPS (CHOLECALCIFEROL) one daily Added new medication of POTASSIUM CHLORIDE CRYS CR 20 MEQ CR-TABS (POTASSIUM CHLORIDE CRYS CR) one daily - Signed Rx of SYNTHROID 200 MCG TABS (LEVOTHYROXINE SODIUM) one daily;  #30 x 3;  Signed;  Entered by: Luretha Murphy NP;  Authorized by: Luretha Murphy NP;  Method used: Electronically to [facility] [address], [place], [place], [place]  [address], Ph: [phone], Fax: [phone] Rx of POTASSIUM CHLORIDE CRYS CR 20 MEQ CR-TABS (POTASSIUM CHLORIDE CRYS CR) one daily;  #30 x 6;  Signed;  Entered by: Luretha Murphy NP;  Authorized by: Luretha Murphy NP;  Method used: Electronically to [facility] [address], [place], [place], [place]  [address], Ph: [phone], Fax: [phone]    Prescriptions: POTASSIUM CHLORIDE CRYS CR 20 MEQ CR-TABS (POTASSIUM CHLORIDE CRYS CR) one daily  #30 x 6   Entered and Authorized by:   Luretha Murphy NP   Signed by:   Luretha Murphy NP on 12/31/2010   Method used:   Electronically to        [facility] (retail)       [address]       [place], [place]  [address]       Ph: [phone]       Fax: [phone]   RxID:   [id number] SYNTHROID 200 MCG TABS (LEVOTHYROXINE SODIUM) one daily  #30 x 3   Entered and Authorized by:   Luretha Murphy NP   Signed by:   Luretha Murphy NP on 12/31/2010   Method used:   Electronically to        [facility] (retail)       [address]       [place], [place]  [address]       Ph: [phone]       Fax: [phone]   RxID:    [id number]

## 2011-01-09 NOTE — Telephone Encounter (Signed)
Spoke with pt & advised to start iron tabs. To eat more fiber & use colace if needed. She will call & make an appt to speak with pcp about her depression. States she is bored & has nothing to do & does not like being alone.Elijah Birk, Esperanza Richters

## 2011-01-09 NOTE — Telephone Encounter (Signed)
Please call patient and tell her to make a visit, she can also start iron if she wants to.

## 2011-01-22 ENCOUNTER — Ambulatory Visit (INDEPENDENT_AMBULATORY_CARE_PROVIDER_SITE_OTHER): Payer: Self-pay | Admitting: Family Medicine

## 2011-01-22 ENCOUNTER — Encounter: Payer: Self-pay | Admitting: Family Medicine

## 2011-01-22 VITALS — BP 144/87 | HR 82 | Wt 203.0 lb

## 2011-01-22 DIAGNOSIS — F329 Major depressive disorder, single episode, unspecified: Secondary | ICD-10-CM

## 2011-01-22 DIAGNOSIS — F3289 Other specified depressive episodes: Secondary | ICD-10-CM

## 2011-01-22 DIAGNOSIS — F32A Depression, unspecified: Secondary | ICD-10-CM

## 2011-01-22 DIAGNOSIS — E210 Primary hyperparathyroidism: Secondary | ICD-10-CM

## 2011-01-22 DIAGNOSIS — I10 Essential (primary) hypertension: Secondary | ICD-10-CM

## 2011-01-22 DIAGNOSIS — E890 Postprocedural hypothyroidism: Secondary | ICD-10-CM

## 2011-01-22 MED ORDER — CITALOPRAM HYDROBROMIDE 20 MG PO TABS: 20.0000 mg | ORAL_TABLET | Freq: Every day | ORAL | Status: DC

## 2011-01-22 NOTE — Patient Instructions (Signed)
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

## 2011-01-22 NOTE — Assessment & Plan Note (Signed)
Reporting all the symptoms of depression, may be overlapping the with the symptoms of hypothyroidism (dosage increased last month).  She is crying and obsessing about death so will treat with SSRI, recheck in one month.  Recommended increased exercise.  Very childlike in her thoughts.

## 2011-01-22 NOTE — Assessment & Plan Note (Signed)
Has had normal calcium since surgery

## 2011-01-22 NOTE — Assessment & Plan Note (Signed)
Dosage of thyroid replacement increased to 200 mcg, recheck in 4 more weeks.  Could be contributing to mood.

## 2011-01-22 NOTE — Assessment & Plan Note (Signed)
On potassium supplement for chronic low K, may need Norvasc increased but would like to get a handle on her thyroid replacement.

## 2011-01-22 NOTE — Progress Notes (Signed)
  Subjective:    Patient ID: Jenna [name], female    DOB: 1961-08-11, 50 y.o.   MRN: [id number]  HPI : I feel like I am going crazy, I cry a lot, I think about death. Reports that husband and her are fighting about her daughter living with them and not working and being lazy.  She has felt like this since her thyroid, parathyroid surgery.  She reports being compliant with her thyroid replacement.  Feels bored, wants to work with children.  Hands still feel numb (TSH in the 40s one month ago), skin is dry.  She sleeps often.  She has been going to dance class two time per week and enjoys such.  She feels dizzy from time to time, it does not last long, it worries her.  She reports taking all of her medications.  Toothache and currently on antibiotics from the ER.  Has advanced gum disease and many loose teeth, she cannot afford a dentist. She does not qualify for [facility] but has not benefits.  She states that before I was married I had Medicaid.    Review of Systems  Constitutional: Positive for activity change and fatigue. Negative for appetite change and unexpected weight change.  Cardiovascular: Negative for chest pain.  Musculoskeletal: Negative for arthralgias.  Neurological: Positive for dizziness, light-headedness and numbness. Negative for tremors, seizures and syncope.  Psychiatric/Behavioral: Positive for dysphoric mood and decreased concentration. Negative for agitation.       Objective:   Physical Exam  Constitutional:       Overweight, moved easily  HENT:  Right Ear: External ear normal.  Left Ear: External ear normal.  Nose: Nose normal.  Eyes: EOM are normal. Pupils are equal, round, and reactive to light.  Neck: Normal range of motion. Neck supple. No tracheal deviation present.       Scar over neck at site of tyroid surgery, no masses   Cardiovascular: Normal rate, regular rhythm and normal heart sounds.   Pulmonary/Chest: Effort normal and breath sounds normal.   Lymphadenopathy:    She has no cervical adenopathy.  Skin: Skin is dry.  Psychiatric:       Cried, alternated with smiles.          Assessment & Plan:

## 2011-02-12 ENCOUNTER — Telehealth: Payer: Self-pay | Admitting: Family Medicine

## 2011-02-12 LAB — PTH, INTACT AND CALCIUM
Calcium, Total (PTH): 8.4 mg/dL (ref 8.4–10.5)
PTH: 25.4 pg/mL (ref 14.0–72.0)

## 2011-02-12 LAB — BASIC METABOLIC PANEL
BUN: 7 mg/dL (ref 6–23)
CO2: 29 mEq/L (ref 19–32)
Calcium: 9.1 mg/dL (ref 8.4–10.5)
Chloride: 107 mEq/L (ref 96–112)
Chloride: 109 mEq/L (ref 96–112)
Creatinine, Ser: 0.96 mg/dL (ref 0.4–1.2)
GFR calc non Af Amer: >60 mL/min (ref >60)
Glucose, Bld: 126 mg/dL — ABNORMAL HIGH (ref 70–99)
Potassium: 3.8 mEq/L (ref 3.5–5.1)
Sodium: 137 mEq/L (ref 135–145)

## 2011-02-12 LAB — CBC
MCH: 28.2 pg (ref 26.0–34.0)
MCHC: 34.3 g/dL (ref 30.0–36.0)
MCV: 82.1 fL (ref 78.0–100.0)
Platelets: 171 10*3/uL (ref 150–400)
RDW: 14.6 % (ref 11.5–15.5)

## 2011-02-12 LAB — COMPREHENSIVE METABOLIC PANEL
AST: 19 U/L (ref 0–37)
Albumin: 3.3 g/dL — ABNORMAL LOW (ref 3.5–5.2)
Albumin: 3.6 g/dL (ref 3.5–5.2)
BUN: 6 mg/dL (ref 6–23)
BUN: 8 mg/dL (ref 6–23)
Calcium: 8.6 mg/dL (ref 8.4–10.5)
Calcium: 9 mg/dL (ref 8.4–10.5)
Creatinine, Ser: 0.89 mg/dL (ref 0.4–1.2)
Creatinine, Ser: 0.9 mg/dL (ref 0.4–1.2)
GFR calc Af Amer: >60 mL/min (ref >60)
GFR calc Af Amer: >60 mL/min (ref >60)
Total Bilirubin: 0.5 mg/dL (ref 0.3–1.2)
Total Bilirubin: 0.6 mg/dL (ref 0.3–1.2)
Total Protein: 6.3 g/dL (ref 6.0–8.3)

## 2011-02-12 LAB — VITAMIN D 1,25 DIHYDROXY
Vitamin D 1, 25 (OH)2 Total: 92 pg/mL — ABNORMAL HIGH (ref 18–72)
Vitamin D2 1, 25 (OH)2: <8 pg/mL

## 2011-02-12 LAB — DIFFERENTIAL
Basophils Absolute: 0 10*3/uL (ref 0.0–0.1)
Eosinophils Relative: 0 % (ref 0–5)
Lymphocytes Relative: 30 % (ref 12–46)
Lymphs Abs: 3.5 10*3/uL (ref 0.7–4.0)
Monocytes Absolute: 0.8 10*3/uL (ref 0.1–1.0)
Neutro Abs: 7.1 10*3/uL (ref 1.7–7.7)

## 2011-02-12 LAB — CARDIAC PANEL(CRET KIN+CKTOT+MB+TROPI)
CK, MB: 0.9 ng/mL (ref 0.3–4.0)
Relative Index: 0.8 (ref 0.0–2.5)
Total CK: 108 U/L (ref 7–177)
Troponin I: 0.01 ng/mL (ref 0.00–0.06)

## 2011-02-12 LAB — POTASSIUM: Potassium: 3.1 mEq/L — ABNORMAL LOW (ref 3.5–5.1)

## 2011-02-12 LAB — CK TOTAL AND CKMB (NOT AT ARMC)
CK, MB: 1 ng/mL (ref 0.3–4.0)
Relative Index: 0.6 (ref 0.0–2.5)

## 2011-02-12 LAB — POCT CARDIAC MARKERS
CKMB, poc: <1 ng/mL — ABNORMAL LOW (ref 1.0–8.0)
Troponin i, poc: <0.05 ng/mL (ref 0.00–0.09)

## 2011-02-12 LAB — TSH: TSH: 1.934 u[IU]/mL (ref 0.350–4.500)

## 2011-02-12 LAB — CALCIUM: Calcium: 8.6 mg/dL (ref 8.4–10.5)

## 2011-02-12 MED ORDER — AMLODIPINE BESYLATE 5 MG PO TABS: 5.0000 mg | ORAL_TABLET | Freq: Every day | ORAL | Status: DC

## 2011-02-12 NOTE — Telephone Encounter (Signed)
Patient states that she cannot pick up meds until Friday b/c she does not have the money.  Patient is in on Amlodipine 5 mg daily.  Explained the risks she takes by missing this meds but if she can't afford them then she has no choice.  Advised her to avoid excess salt in foods and moderate fluid intake and to purchase meds as soon as possible on Friday.  Pt agreeable.

## 2011-02-12 NOTE — Telephone Encounter (Signed)
Pt going out of town right now, needs to know asap if its ok to go without bp meds?

## 2011-02-12 NOTE — Telephone Encounter (Signed)
Addended by: Nilda Simmer on: 02/12/2011 03:32 PM   Modules accepted: Orders

## 2011-02-12 NOTE — Telephone Encounter (Signed)
Pt need refill on her bp med, but want to know if it's okay to no have a couple days because she won't be able to pick them up until Friday.

## 2011-02-17 LAB — DIFFERENTIAL
Basophils Absolute: 0 K/uL (ref 0.0–0.1)
Basophils Relative: 0 % (ref 0–1)
Eosinophils Absolute: 0 K/uL (ref 0.0–0.7)
Eosinophils Relative: 0 % (ref 0–5)
Lymphocytes Relative: 10 % — ABNORMAL LOW (ref 12–46)
Lymphs Abs: 1.5 K/uL (ref 0.7–4.0)
Monocytes Absolute: 0.9 K/uL (ref 0.1–1.0)
Monocytes Relative: 6 % (ref 3–12)
Neutro Abs: 12.6 K/uL — ABNORMAL HIGH (ref 1.7–7.7)
Neutrophils Relative %: 84 % — ABNORMAL HIGH (ref 43–77)

## 2011-02-17 LAB — URINALYSIS, ROUTINE W REFLEX MICROSCOPIC
Bilirubin Urine: NEGATIVE
Glucose, UA: NEGATIVE mg/dL
Glucose, UA: NEGATIVE mg/dL
Ketones, ur: NEGATIVE mg/dL
Leukocytes, UA: NEGATIVE
Leukocytes, UA: NEGATIVE
Nitrite: NEGATIVE
Protein, ur: NEGATIVE mg/dL
Protein, ur: NEGATIVE mg/dL
Specific Gravity, Urine: 1.011 (ref 1.005–1.030)
Specific Gravity, Urine: 1.022 (ref 1.005–1.030)
Urobilinogen, UA: 0.2 mg/dL (ref 0.0–1.0)
pH: 7 (ref 5.0–8.0)
pH: 7.5 (ref 5.0–8.0)

## 2011-02-17 LAB — COMPREHENSIVE METABOLIC PANEL WITH GFR
ALT: 21 U/L (ref 0–35)
AST: 20 U/L (ref 0–37)
Albumin: 3.9 g/dL (ref 3.5–5.2)
Alkaline Phosphatase: 102 U/L (ref 39–117)
BUN: 4 mg/dL — ABNORMAL LOW (ref 6–23)
CO2: 30 meq/L (ref 19–32)
Calcium: 11.7 mg/dL — ABNORMAL HIGH (ref 8.4–10.5)
Chloride: 104 meq/L (ref 96–112)
Creatinine, Ser: 1.1 mg/dL (ref 0.4–1.2)
GFR calc non Af Amer: 53 mL/min — ABNORMAL LOW
Glucose, Bld: 102 mg/dL — ABNORMAL HIGH (ref 70–99)
Potassium: 3.1 meq/L — ABNORMAL LOW (ref 3.5–5.1)
Sodium: 140 meq/L (ref 135–145)
Total Bilirubin: 0.4 mg/dL (ref 0.3–1.2)
Total Protein: 7.3 g/dL (ref 6.0–8.3)

## 2011-02-17 LAB — CBC
HCT: 50.5 % — ABNORMAL HIGH (ref 36.0–46.0)
Hemoglobin: 16.9 g/dL — ABNORMAL HIGH (ref 12.0–15.0)
MCHC: 33.4 g/dL (ref 30.0–36.0)
MCV: 87.9 fL (ref 78.0–100.0)
Platelets: 192 K/uL (ref 150–400)
RBC: 5.74 MIL/uL — ABNORMAL HIGH (ref 3.87–5.11)
RDW: 16 % — ABNORMAL HIGH (ref 11.5–15.5)
WBC: 15.1 K/uL — ABNORMAL HIGH (ref 4.0–10.5)

## 2011-02-17 LAB — URINE CULTURE

## 2011-02-17 LAB — LIPASE, BLOOD: Lipase: 24 U/L (ref 11–59)

## 2011-02-17 LAB — URINE MICROSCOPIC-ADD ON

## 2011-02-21 ENCOUNTER — Telehealth: Payer: Self-pay | Admitting: Family Medicine

## 2011-02-21 NOTE — Telephone Encounter (Signed)
Called pt and info given. Jenna [name]

## 2011-02-21 NOTE — Telephone Encounter (Signed)
Pt asking about info MD gave her on a free dental clinic this weekend in [place], needs number & address.

## 2011-04-16 NOTE — Group Therapy Note (Signed)
NAME:  Jenna [name], Jenna [name] NO.:  [id number]   MEDICAL RECORD NO.:  [id number]          PATIENT TYPE:  WOC   LOCATION:  [facility]                   FACILITY:  [facility]   PHYSICIAN:  Elsie Lincoln, MD      DATE OF BIRTH:  03/16/1961   DATE OF SERVICE:  08/03/2008                                  CLINIC NOTE   The patient is a 50 year old para 3 female who presents from [facility] for menorrhagia and fibroids.  The patient had been scheduled  in our clinic in May when she had severe menorrhagia and a very low  hemoglobin, approximately 5.  She did not keep that appointment.  She  has since come to the emergency room bleeding and had a hemoglobin of 12  per the patient.  She was given some progesterone pills to stop the  bleeding and she subsequently bled afterwards, which is expected.  She  has had a transvaginal ultrasound, which revealed 11.4 x 8.5 x 7.1 with  several fibroids.  A__________ was seen and there was no evidence of  polyps during this ultrasound done on March 09, 2008.  The patient is  tired of bleeding would like some definitive therapy.   PAST MEDICAL HISTORY:  1. Seizure disorder as a child.  2. 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   SURGICAL HISTORY:  None.   OBSTETRICAL HISTORY:  NSVD  x3.   GYNECOLOGICAL HISTORY:  No history of ovarian cyst.  Last Pap smear was  done this year by [facility].  She has had a tubal in the past for  birth control.   FAMILY HISTORY:  Positive for diabetes in her mother.   SOCIAL HISTORY:  She smokes one pack per day for  20 years.  No  drinking, drugs or abuse.   MEDICATIONS:  1. Depakote.  2. Iron.   ALLERGIES:  None.   REVIEW OF SYSTEMS:  Positive for numbness and week with finger, swelling  in legs, muscle aches, frequent headaches, dizzy spells, loss of urine  with coughing and sneezing, vaginal odor and vaginal bleeding.   PHYSICAL EXAM:  Temperature 98.3, pulse 90, blood pressure 138/96.  Weight  194.3.  Height 5 feet 5 inches.  GENERAL:  Well-nourished, well-developed in no apparent distress.  HEENT:  Normocephalic, atraumatic.  CHEST:  Normal movement.  ABDOMEN:  Obese, soft, nontender, no organomegaly.  No hernia.  GENITALIA:  Tanner 5.  Vagina pink, normal rugae.  Cervix closed with an  Nabothian cyst.  Uterus enlarged to about 13 weeks size, not tender.  Adnexa no masses, nontender, but difficult to examine secondary to  habitus.  EXTREMITIES:  Nontender.   PROCEDURE:  The patient had an endometrial biopsy done secondary to  abnormal uterine bleeding and her age.  The uterus sounded at  approximately 10.5 x 11 cm and copious amount of tissue was obtained  without incident.   ASSESSMENT/PLAN:  [age]-year-old female with menorrhagia that had  significant iron deficiency anemia in the past that seems to be  resolving.  1. Endometrial biopsy today.  2. Obtain Pap smear from [facility] to make sure it is  up-to-      date.  3. Make sure mammogram is up-to-date.  4. Return to clinic in 2-3 weeks for plan, possibly IUD versus      endometrial ablation versus Depo-Provera.           ______________________________  Elsie Lincoln, MD     KL/MEDQ  D:  08/03/2008  T:  08/03/2008  Job:  [id number]

## 2011-04-16 NOTE — Op Note (Signed)
NAME:  Jenna [name], Jenna [name] NO.:  [id number]   MEDICAL RECORD NO.:  [id number]          PATIENT TYPE:  AMB   LOCATION:  [facility]                           [facility]   PHYSICIAN:  Norton Blizzard, MD    DATE OF BIRTH:  July 08, 1961   DATE OF PROCEDURE:  10/03/2008  DATE OF DISCHARGE:                               OPERATIVE REPORT   PREOPERATIVE DIAGNOSES:  Intractable menometrorrhagia, uterine fibroids.   POSTOPERATIVE DIAGNOSES:  Intractable menometrorrhagia, uterine  fibroids, submucosal fibroid.   PROCEDURE:  Hysteroscopic hydrothermal endometrial ablation.   SURGEON:  Norton Blizzard, MD   ANESTHESIA:  General.   IV FLUIDS:  1600 mL of lactated Ringer's.   ESTIMATED BLOOD LOSS:  Minimal.   INDICATIONS:  The patient is a 50 year old gravida 3, para 3 with a long  history of menometrorrhagia over the past couple of years.  Her lowest  hemoglobin at some point was 5 and she has required multiple blood  transfusions.  She had a negative endometrial biopsy and ultrasound  which showed an 11-week size uterus with small intramural fibroids.  The  patient was advised about her options for managing her menometrorrhagia  and she opted for hydrothermal endometrial ablation.  Prior to surgery,  the risk of surgery including bleeding, infection, injury to the uterus  and surrounding organs, persistence of symptoms with possible need for  additional procedures were discussed with the patient and written  informed consent was obtained.   FINDINGS:  There was a 3 cm submucosal fibroid that was noted in  posterior fundal area, otherwise diffuse proliferative endometrium and  normal ostia bilaterally.   COMPLICATIONS:  None.   PROCEDURE DETAILS:  The patient was taken to the operating room where  general anesthesia was administered and found to be adequate.  She was  then placed in the dorsal lithotomy position, and prepped and draped in  sterile manner.  A red rubber  catheter was used to empty the bladder for  an unmeasured amount of clear yellow urine.  Attention was turned to the  patient's pelvis where a vaginal speculum was placed and a tenaculum was  placed on the anterior lip of the cervix.  A paracervical block was  administered using 20 mL of 0.5% Marcaine.  The cervix was then dilated  up to 27-French to accommodate the hydrothermal ablation hysteroscope.  Once this was done, the hysteroscope was advanced gently to the uterine  fundus, and diffusely proliferative endometrium, normal ostia and a 3-cm  posterior submucosal fibroid were noted.  Hydrothermal endometrial  ablation was then done according to protocol.  At the end of the  ablation, the endometrial cavity was noted to be entirely ablated  including the cornual regions, and also there was desiccation of the  submucosal fibroid.  At the end of the case, the fluid was cooled and  removed from the endometrial cavity.  There was some bleeding at the  tenaculum site which had to be controlled with a combination of pressure  and one interrupted stitch of 0 Vicryl.  After good hemostasis was  ensured, all  instruments were then removed from the patient's pelvis.  The patient  tolerated the procedure well.  Sponge, instrument, and needle counts  were correct x2.  She was taken to the recovery room extubated, awake  and in stable condition.      Norton Blizzard, MD  Electronically Signed     UAD/MEDQ  D:  10/03/2008  T:  10/04/2008  Job:  [id number]

## 2011-04-16 NOTE — Discharge Summary (Signed)
NAME:  Jenna, [name] NO.:  [id number]   MEDICAL RECORD NO.:  [id number]          PATIENT TYPE:  INP   LOCATION:  5529                         FACILITY:  [facility]   PHYSICIAN:  Jenna [name], M.D.DATE OF BIRTH:  05-18-1961   DATE OF ADMISSION:  03/09/2008  DATE OF DISCHARGE:  03/10/2008                               DISCHARGE SUMMARY   PRIMARY CARE Jenna [name]:  Nurse practitioner, Jenna [name] at [facility].   REASON FOR ADMISSION:  Hemoglobin of 5.7.   DISCHARGE DIAGNOSES:  1. Iron deficiency anemia.  2. Uterine fibroids.  3. Endometrial polyp versus submucosal fibroid.  4. Complex left ovarian cyst.  5. Tobacco abuse.  6. Seizure disorder.   DISCHARGE MEDICATIONS:  1. Ferrous sulfate 325 mg p.o. t.i.d.  2. Depakote 250 mg - 3 tablets p.o. nightly.  3. Doxycycline 100 mg p.o. b.i.d. x7 days.   HOSPITAL COURSE:  1. Anemia:  The patient has a history of anemia, and after being seen      recently at [facility], found to have a hemoglobin of      5.7.  She was admitted for blood transfusion as well as a possible      anemia workup.  The patient has a history of menorrhagia and also      complained of one-week of bright red blood bowel movement.  She was      admitted and transfused 2 units of packed red blood cells.  She      also had an iron panel ordered.  The patient also underwent      transvaginal as well as pelvic ultrasound, which was significant      for numerous uterine masses more consistent with fibroids, possible      focal 1.1 cm area of endometrial hyperechogenicity consistent with      this endometrial polyp versus submucosal fibroid.  The patient also      had a likely complex left ovarian cyst.  The patient was given IV      iron of 750 mg.  At the patient's request, she was discharged home      one day after admission to continue iron therapy p.o.  No further      episodes of bleeding were noted.  The patient's  hemoglobin was      stabilized.  No GI consult was pursued as the patient's significant      anemia was felt most likely secondary to her menorrhagia and bright      red blood with her bowel movements which was likely secondary to      the external hemorrhoids noted on physical exam.  The patient is to      follow up with Dr. Luretha [name] and will need GYN followup for her      uterine findings.  The patient was strongly counseled on the      necessity of taking her oral irons.  2. Uterine abnormalities:  As mentioned, the patient had several      findings on her ultrasound including possible fibroids, complex  left ovarian cyst, and endometrial polyp versus submucosal      fibroids.  The patient does not have a gynecologist.  These      findings were communicated to her primary care Jenna [name].  A GYN      referral was initially attempted from the hospital, however, unable      to be completed; therefore, this one needed to be completed by the      patient's primary care Jenna [name] as an outpatient.  Some of the      potential options are briefly explained to the patient including      hysterectomy versus myomectomy versus uterine artery embolization.      She will need further discussion with her gynecologist.  3. Tobacco abuse:  While in the hospital, the patient had a nicotine      patch placed.  A smoking cessation consult was obtained.  The      patient has to continue counseling about tobacco cessation as an      outpatient.   CONDITION AT THE TIME OF DISCHARGE:  Improved.   DISCHARGE LABS:  Hemoglobin 7.6 after transfusion, MCV 63.7, RDW 30.3,  and TSH 1.078.   DISPOSITION:  The patient is discharged to home with the oral iron  repletion.   DISCHARGE FOLLOWUP:  The patient is to follow up with Jenna [name], nurse  practitioner, on March 16, 2008, at 4:15 p.m. and at that time will have  a referral completed to a gynecologist.   FOLLOWUP ISSUES:  1. The patient will need  continued monitoring of her hemoglobin level.  2. The patient is to continue oral iron supplementation to replete her      iron stores.  3. The patient will need definitive treatment of her likely fibroids      in order to address her menorrhagia.  4. Smoking cessation.      Jenna Franklin, MD  Electronically Signed      Jenna [name], M.D.  Electronically Signed    TCB/MEDQ  D:  03/12/2008  T:  03/13/2008  Job:  [id number]

## 2011-04-16 NOTE — Group Therapy Note (Signed)
NAME:  Jenna [name], Jenna [name] NO.:  [id number]   MEDICAL RECORD NO.:  [id number]          PATIENT TYPE:  WOC   LOCATION[facility]  [facility]                   FACILITY:  [facility]   PHYSICIAN:  Argentina Donovan, MD        DATE OF BIRTH:  [birth year]   DATE OF SERVICE:  08/18/2008                                  CLINIC NOTE   The patient is a 50 year old African American female gravida 3, para 3-0-  0-3, married who has had episodes of extremely heavy vaginal bleeding  over the past couple of years.  At one point, had a hemoglobin of 5, and  she needed transfusions.  Her last one that we have on record was in  July when she had a hemoglobin of 13.8.  She has had bleeding episodes  since then and we are going to repeat that.  She was seen 2 weeks ago,  had an endometrial biopsy, which showed benign proliferative  endometrium.  No hyperplasia.  Ultrasound showed a uterus that measured  11.4 cm and was sounded to depth on the endometrial biopsy of 10.5 cm.  I have talked to her about the options including intrauterine  contraceptive device and progesterone, Depo-Provera, endometrial  ablation, and hysterectomy and she is opt to try the ablation.  We will  give the information on hydrothermal endometrial ablation.  She has had  some small fibroids and we have talked to her about the possibility of  failure with this and that will be lucky if they cut down the bleeding  significantly and occasionally it will cause amenorrhea.  We are going  to get another CBC on her today.  Pap smear was taken today and  mammogram is being scheduled.  The patient's only medication she takes  is Depakote for seizure.  She has not had one in the last 2 years.   She was 194 pounds with 5 feet 5 inches tall, and is otherwise in good  health, although she does smoke a pack of cigarettes a day and has  decided she would like to stop.  We have talked to her about the options  of stopping and we are going to give her  the option of deciding what she  wants to do with that regard to that.   IMPRESSION:  Intractable menometrorrhagia with fibroids.   PLAN:  Attempt to do the hydrothermal ablation.           ______________________________  Argentina Donovan, MD     PR/MEDQ  D:  08/18/2008  T:  08/19/2008  Job:  [id number]

## 2011-04-16 NOTE — Discharge Summary (Signed)
NAME:  Jenna [name], Jenna [name] NO.:  [id number]   MEDICAL RECORD NO.:  [id number]          PATIENT TYPE:  INP   LOCATION:  5529                         FACILITY:  [facility]   PHYSICIAN:  Ancil Boozer, MD      DATE OF BIRTH:  July 03, 1961   DATE OF ADMISSION:  03/09/2008  DATE OF DISCHARGE:  03/10/2008                               DISCHARGE SUMMARY   REASON FOR ADMISSION:  Iron infusion.   DISCHARGE DIAGNOSES:  1. Iron deficiency anemia.  2. Seizure disorder.  3. Tobacco dependence.  4. Carbuncle.   LABORATORY DATA:  CBC revealed white blood cell count of 5.3, hemoglobin  7.6, hematocrit 24.0, and platelet count 205.  TIBC was less than 10,  percent saturation not calculable, TIBC not calculable.  TSH was 1.078.  Retic count was 34.1.   RADIOLOGY STUDIES:  Transvaginal ultrasound revealed numerous uterine  masses mostly consistent with fibroids, normal endometrial stripe  thickness, a possible focal 1.1-cm stripe of endometrial  hyperechogenicity, and probable complex left ovarian cyst.   HOSPITAL COURSE:  The patient was admitted and transfused 2 units of  packed red blood cells.  We transfused her also with INFeD during her  hospital stay.  She was given a pelvic ultrasound to evaluate for source  of blood loss, which revealed findings as above.  She was continued on  seizure medications and was started on doxycycline for her carbuncle.  She was placed on a nicotine patch for tobacco abuse and was given  smoking cessation consult.   INSTRUCTIONS AND FOLLOWUP:  The patient is to follow up with Dr. Hal Hope on March 16, 2008 at 4:15 p.m. and a [facility] to be arranged at her appointment with Hal Hope.  She is  follow up with low-sodium, heart-healthy diet, and has no restrictions  with regard to activity.  She is also instructed to stop smoking.   DISCHARGE MEDICATIONS:  1. Ferrous sulfate 325 mg p.o. t.i.d.  2. Depakote 250 mg 3  tablets q.h.s.  3. Doxycycline 100 mg p.o. b.i.d. x7 days.      Ancil Boozer, MD  Electronically Signed     SA/MEDQ  D:  04/27/2008  T:  04/28/2008  Job:  [id number]

## 2011-04-19 ENCOUNTER — Encounter: Payer: Self-pay | Admitting: Family Medicine

## 2011-04-19 ENCOUNTER — Ambulatory Visit (INDEPENDENT_AMBULATORY_CARE_PROVIDER_SITE_OTHER): Payer: Self-pay | Admitting: Family Medicine

## 2011-04-19 VITALS — BP 127/84 | HR 83 | Temp 98.0°F | Wt 208.0 lb

## 2011-04-19 DIAGNOSIS — M25519 Pain in unspecified shoulder: Secondary | ICD-10-CM

## 2011-04-19 DIAGNOSIS — E890 Postprocedural hypothyroidism: Secondary | ICD-10-CM

## 2011-04-19 DIAGNOSIS — M654 Radial styloid tenosynovitis [de Quervain]: Secondary | ICD-10-CM

## 2011-04-19 DIAGNOSIS — M25512 Pain in left shoulder: Secondary | ICD-10-CM

## 2011-04-19 DIAGNOSIS — I10 Essential (primary) hypertension: Secondary | ICD-10-CM

## 2011-04-19 NOTE — Patient Instructions (Addendum)
Focus on lean proteins (fish, chicken) and vegetables for your diet, avoid sweets and simple carbohydrates (i.e. Rice, white bread, pasta, french fries, potatoes).  Try a handful of nuts instead of a carb snack (crackers or cookies- don't eat these).  Simple sugars and carbohydrates can contribute to inflammation and result in pain.  For shoulder pain, do these range-of-motion and stretching exercises at least twice a day to improve left shoulder pain and your decreased range-of-motion.  Calcium Carbonate or oyster shell calcium twice daily Vitamin D 2000 mg daily  Return in 8 weeks

## 2011-04-19 NOTE — Progress Notes (Signed)
  Subjective:    Patient ID: Jenna [name], female    DOB: 09-Jun-1961, 50 y.o.   MRN: [id number]  HPI Reports right upper quadrant abdominal pain that comes and goes.  Cramping in nature.  Denies changes in bowels, denies fever, denies nausea or vomiting.  Left shoulder pain, cannot extend arm without pain.  Evaluated in past by Dr. Pearletha Forge in [facility] clinic in 2010.  Shoulder injection at that time, and PT seemed to improve that pain for this long.  She right wist if very painful, especially in the morning.  She thinks she sleeps with there arms under her.  She reports taking her thyroid medication every day.  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   Review of Systems  Constitutional:       See HPI  Respiratory: Negative for chest tightness.   Cardiovascular: Negative for palpitations.  Gastrointestinal: Positive for abdominal pain and abdominal distention. Negative for nausea, vomiting and diarrhea.  Musculoskeletal: Positive for arthralgias.  Neurological: Positive for numbness. Negative for light-headedness.       Intermittent numbness in her finger tips  Psychiatric/Behavioral: Negative for dysphoric mood and agitation.       Objective:   Physical Exam  Constitutional:       Alert, obese, moved easily  Neck: Normal range of motion. Neck supple.       Incision   Cardiovascular: Normal rate, regular rhythm and normal heart sounds.   Pulmonary/Chest: Effort normal and breath sounds normal.  Abdominal: Soft. Bowel sounds are normal. There is tenderness.       Epigastric tenderness, right chest wall tenderness otherwise normal exam  Musculoskeletal:       Left shoulder able to get to full extension passively, patient was uncomfortable.  Tenderness over the supraspinatus tendon.  Negative empty can sign.  Negative drop arm test. Very tender over the first right dorsal compartment of the hand.            Assessment & Plan:

## 2011-04-19 NOTE — Op Note (Signed)
NAME:  Jenna [name], Jenna [name]          ACCOUNT NO.:  [id number]   MEDICAL RECORD NO.:  [id number]          PATIENT TYPE:  AMB   LOCATION:  DSC                          FACILITY:  [facility]   PHYSICIAN:  Sandria Bales. Ezzard Standing, M.D.  DATE OF BIRTH:  06-30-1961   DATE OF PROCEDURE:  09/15/2006  DATE OF DISCHARGE:                                 OPERATIVE REPORT   PREOPERATIVE DIAGNOSIS:  Approximately 8 cm left abdominal wall mass.   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   PROCEDURE:  Excision of left abdominal wall mass.   SURGEON:  Sandria Bales. Ezzard Standing, M.D.   ANESTHESIA:  General LMA with 30 cc of 0.25% Marcaine.   INDICATIONS FOR PROCEDURE:  [name] is a 50 year old black female, who  has a mass in her left abdominal wall that she has had for several years.  She is a patient of Luretha Murphy at the [facility] center.  It is  really without change in size over the past couple of years, but she comes  for excision of this mass.   The mass measures about 8 to 9 cm in maximum diameter.   The indications and potential complications of the procedure were explained  to the patient.  The procedure complications include but are not limited to  bleeding, infection and recurrence of the lipoma.   OPERATIVE NOTE:  The patient was placed in a supine position, given a  general LMA anesthesia supervised by Dr. Adonis Huguenin.  Her abdomen was  prepped with Betadine solution and sterilely draped.   An incision was made directly over this mass, which was in her left lower  quadrant; it almost ran to her flank.  Sharp dissection was carried down,  excising this softball-size mass, about 8 to 9 cm in maximum diameter.  Again, it had the appearance of a benign lipoma.  It was attached to the  fascia of the abdominal wall, but not into the muscle.   Hemostasis was controlled with Bovie electrocautery.  The subcutaneous  tissues were closed with 3-0 Vicryl  suture.  The skin was closed with a 5-0  Vicryl suture, painted with tincture of benzoin and Steri-Strips.   The patient tolerated the procedure well and was transported to the recovery  room in good condition.  Sponge and needle count were correct at the end of  the case.      Sandria Bales. Ezzard Standing, M.D.  Electronically Signed     DHN/MEDQ  D:  09/15/2006  T:  09/15/2006  Job:  [id number]   cc:   Luretha Murphy, NP

## 2011-04-22 ENCOUNTER — Telehealth: Payer: Self-pay | Admitting: *Deleted

## 2011-04-22 ENCOUNTER — Encounter: Payer: Self-pay | Admitting: Family Medicine

## 2011-04-22 DIAGNOSIS — M654 Radial styloid tenosynovitis [de Quervain]: Secondary | ICD-10-CM | POA: Insufficient documentation

## 2011-04-22 MED ORDER — LEVOTHYROXINE SODIUM 150 MCG PO TABS: 150.0000 ug | ORAL_TABLET | Freq: Every day | ORAL | Status: DC

## 2011-04-22 NOTE — Assessment & Plan Note (Signed)
Rotator cuff syndrome, do not believe she has a significant tear.  Kenalog and marcaine injection with exercises and physical therapy referral.

## 2011-04-22 NOTE — Telephone Encounter (Signed)
Pt called back and doesn't have insurance.  Will have to wait for PT- she is dealing with something about her house and hasn't gone to see [facility] yet.  She will let us know when she can go.

## 2011-04-22 NOTE — Assessment & Plan Note (Signed)
Now TSH very low, suspect the reading 3 months ago indicated that she was not taking her replacement.  Will adjust down to 1/2 tab of the 200 mg until she finishes this dosage (money is very tight) and then place on 150 mcg.

## 2011-04-22 NOTE — Telephone Encounter (Signed)
Left message for patient to return call. Need to know if patient has insurance for PT referral, no card scanned into chart.Jenna [name], Rodena Medin

## 2011-04-22 NOTE — Assessment & Plan Note (Signed)
Instructed to resume calcium and vitamin D replacement

## 2011-04-22 NOTE — Assessment & Plan Note (Signed)
Add wrist splinting at night and prn.

## 2011-05-22 ENCOUNTER — Telehealth: Payer: Self-pay | Admitting: Family Medicine

## 2011-05-22 ENCOUNTER — Other Ambulatory Visit: Payer: Self-pay | Admitting: Family Medicine

## 2011-05-22 NOTE — Telephone Encounter (Signed)
Refill request

## 2011-05-22 NOTE — Telephone Encounter (Signed)
Pt states she is finishing the levothyroxine (SYNTHROID) 150 MCG tablet But it was 200 mcg and [facility] [place] needs new rx to get the correct dosage.

## 2011-05-22 NOTE — Telephone Encounter (Signed)
Her TSH was low and thus she was over replaced on 200 mcg, she will remain on 150 mcg for 6 months then will need recheck.

## 2011-05-24 ENCOUNTER — Other Ambulatory Visit: Payer: Self-pay | Admitting: Family Medicine

## 2011-05-24 NOTE — Telephone Encounter (Signed)
Refill request

## 2011-06-14 IMAGING — CR DG ANKLE COMPLETE 3+V*R*
3 series · 3 of 3 positions shown · non-contrast
Comparison: 04/08/2009

CLINICAL DATA: Right ankle pain - no known injury

RIGHT ANKLE - COMPLETE 3+ VIEW

[t ankle joint lat right]
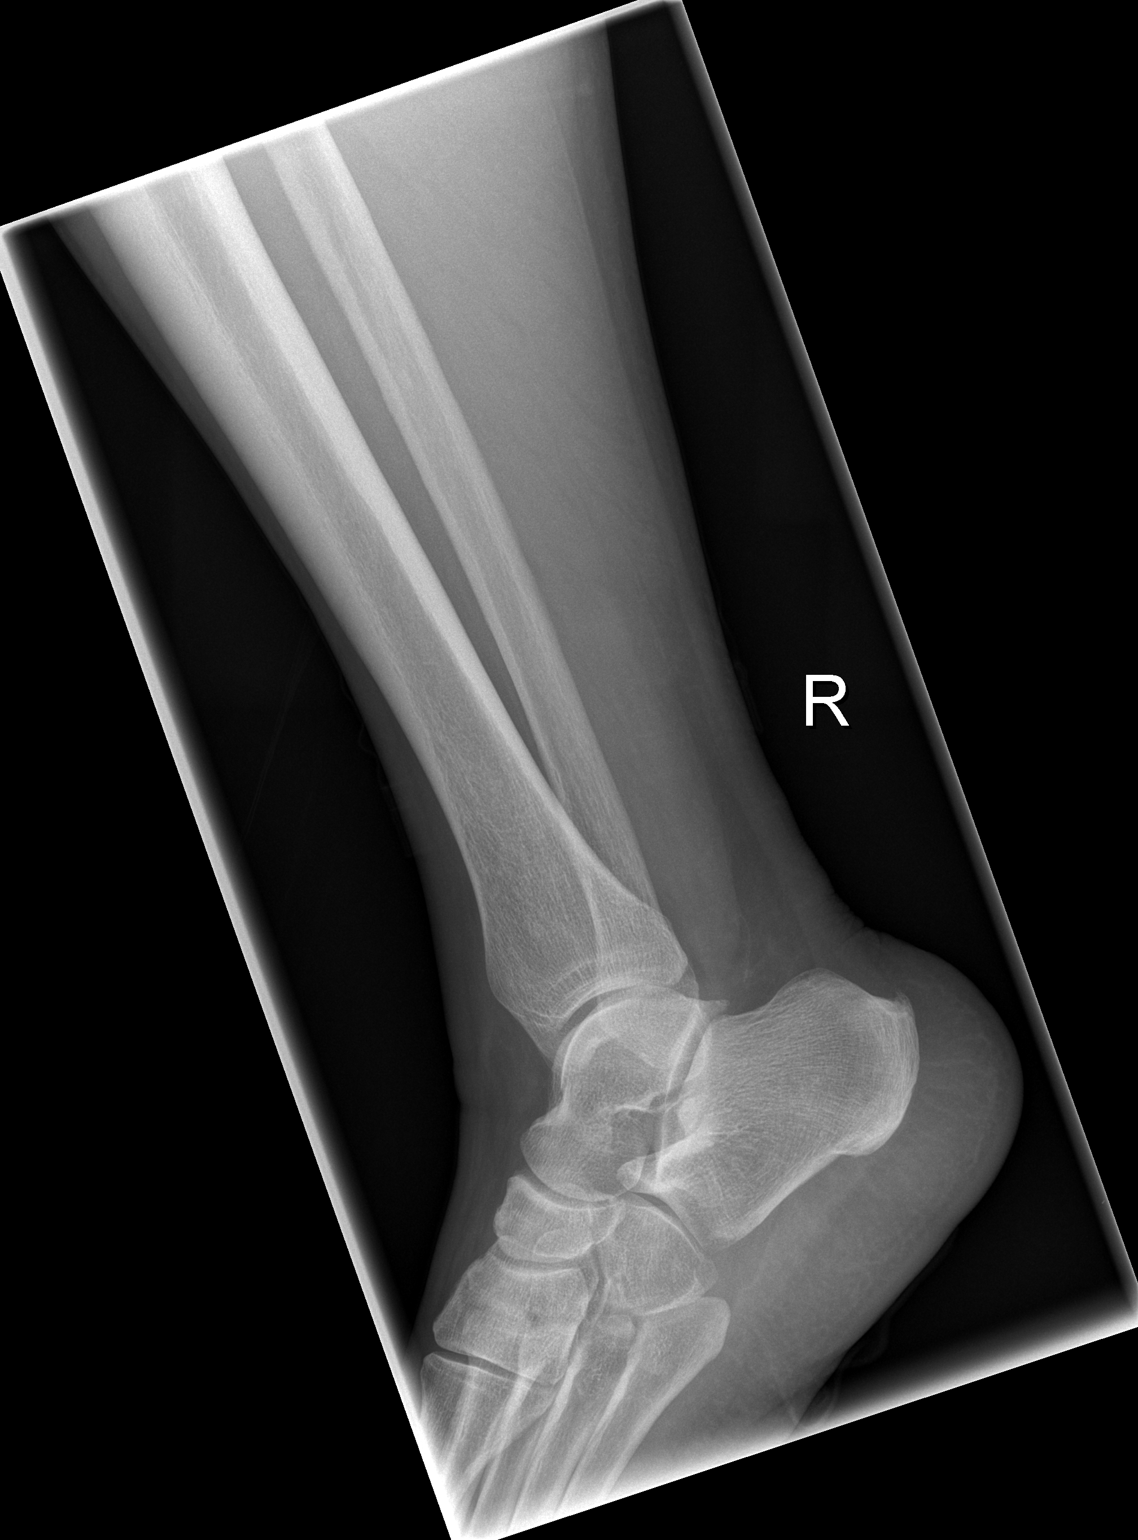

[t ankle joint ap right]
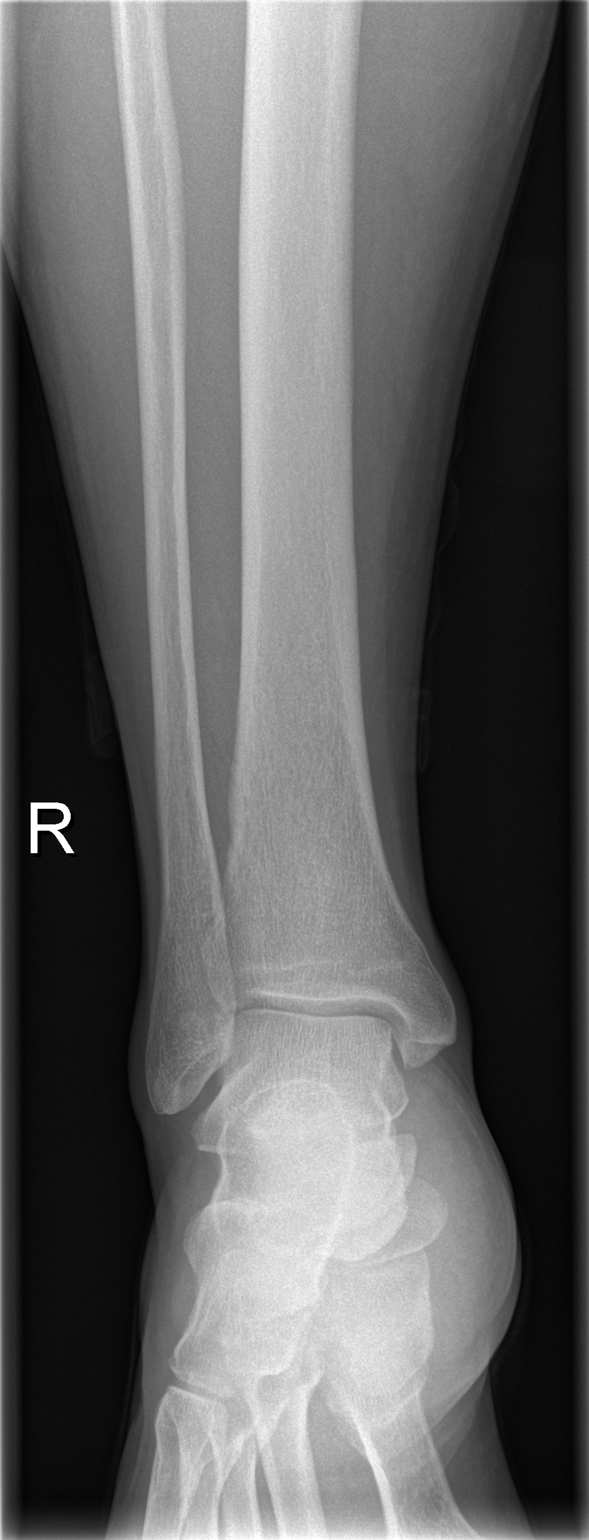

[t ankle joint oblique right]
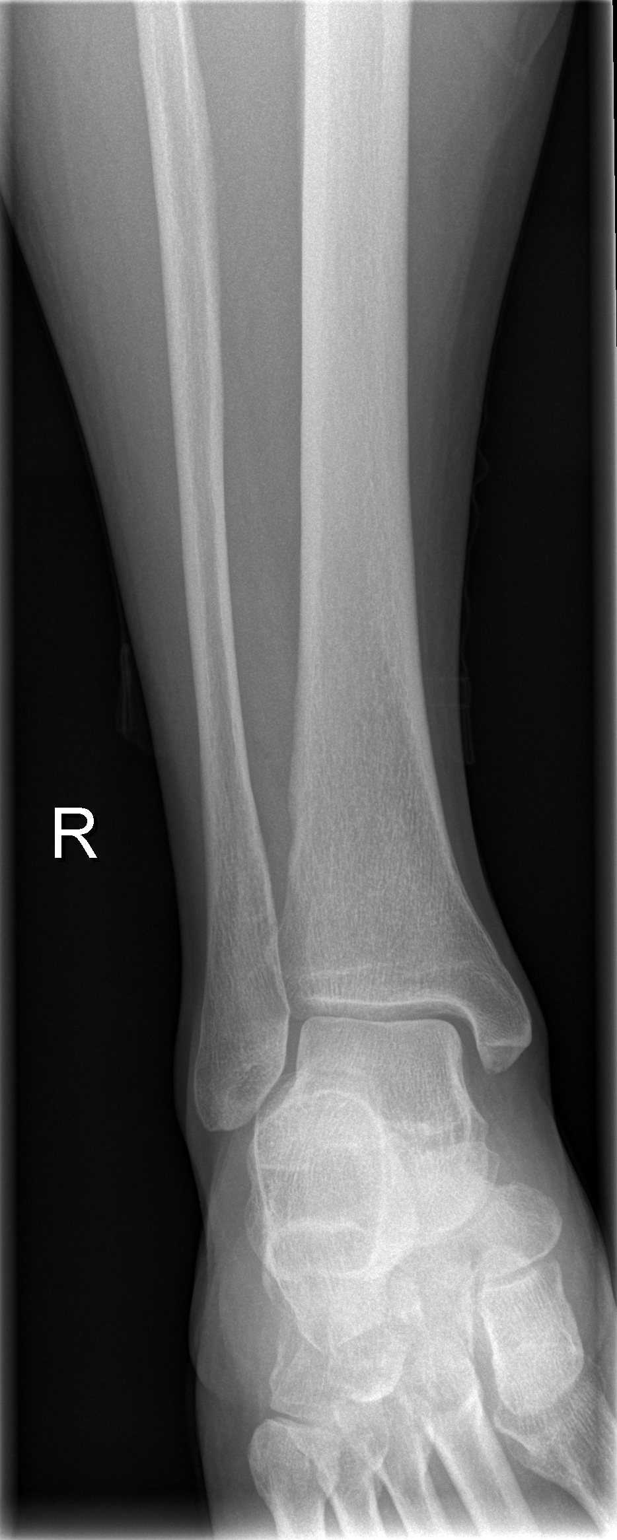

[3 of 3 positions shown; findings below may reference images not displayed]

FINDINGS: No fracture or dislocation.  No foreign body or other
abnormality of the soft tissues.
IMPRESSION: No acute or significant findings.

## 2011-08-27 LAB — CBC
HCT: 24 — ABNORMAL LOW
Hemoglobin: 7.6 — CL
RBC: 3.78 — ABNORMAL LOW

## 2011-08-27 LAB — CROSSMATCH: Antibody Screen: NEGATIVE

## 2011-08-27 LAB — RETICULOCYTES
RBC.: 3.37 — ABNORMAL LOW
Retic Count, Absolute: 74.1

## 2011-08-27 LAB — IRON AND TIBC: UIBC: 445

## 2011-08-27 LAB — ABO/RH: ABO/RH(D): B POS

## 2011-08-29 LAB — CBC
HCT: 36.1
HCT: 41.8
Hemoglobin: 12
Hemoglobin: 13.8
MCV: 78.4
MCV: 83.3
RBC: 4.61
RBC: 5.01
WBC: 5.8
WBC: 8.7

## 2011-08-29 LAB — WET PREP, GENITAL
Clue Cells Wet Prep HPF POC: NONE SEEN
WBC, Wet Prep HPF POC: NONE SEEN

## 2011-08-29 LAB — DIFFERENTIAL
Eosinophils Absolute: 0.1
Eosinophils Relative: 1
Lymphs Abs: 2.7
Monocytes Absolute: 0.6
Monocytes Relative: 7
Neutrophils Relative %: 60

## 2011-08-29 LAB — POCT PREGNANCY, URINE: Operator id: 181461

## 2011-08-29 LAB — GC/CHLAMYDIA PROBE AMP, GENITAL: Chlamydia, DNA Probe: NEGATIVE

## 2011-08-30 ENCOUNTER — Encounter: Payer: Self-pay | Admitting: Family Medicine

## 2011-08-30 ENCOUNTER — Ambulatory Visit (INDEPENDENT_AMBULATORY_CARE_PROVIDER_SITE_OTHER): Payer: Self-pay | Admitting: Family Medicine

## 2011-08-30 DIAGNOSIS — M722 Plantar fascial fibromatosis: Secondary | ICD-10-CM

## 2011-08-30 DIAGNOSIS — M654 Radial styloid tenosynovitis [de Quervain]: Secondary | ICD-10-CM

## 2011-08-30 DIAGNOSIS — M25512 Pain in left shoulder: Secondary | ICD-10-CM

## 2011-08-30 DIAGNOSIS — E210 Primary hyperparathyroidism: Secondary | ICD-10-CM

## 2011-08-30 DIAGNOSIS — M25519 Pain in unspecified shoulder: Secondary | ICD-10-CM

## 2011-08-30 DIAGNOSIS — E890 Postprocedural hypothyroidism: Secondary | ICD-10-CM

## 2011-08-30 MED ORDER — IBUPROFEN 800 MG PO TABS: 800.0000 mg | ORAL_TABLET | Freq: Three times a day (TID) | ORAL | Status: DC | PRN

## 2011-08-30 NOTE — Assessment & Plan Note (Signed)
Will need rechecked

## 2011-08-30 NOTE — Assessment & Plan Note (Signed)
Reviewed condition and treatment, handout given, practiced exercises in the room

## 2011-08-30 NOTE — Assessment & Plan Note (Signed)
Seems to have a tendosynovitis of sorts.  She has chronic pain complaints all the time.  She has a diet very high in sugar, she does not exercise.  She does have a splint.  There may be some carpel entrapment but would not change the plan at this time.

## 2011-08-30 NOTE — Progress Notes (Signed)
  Subjective:    Patient ID: Jenna [name], female    DOB: 19-Oct-1961, 50 y.o.   MRN: [id number]  HPI  Right hand with swelling in the morning and hurts.  Sometimes it is hard to grip things. Shoulders are painful left more so, cannot raise arm above shoulder height. Feet painful when getting up in the morning especially the right heel. Left lower quadrant abdominal pain that comes and goes.  Has frequent soft BMs and a lot of gas.  Review of Systems  Constitutional:       See HPI  Genitourinary: Negative for dysuria, hematuria and vaginal bleeding.       Objective:   Physical Exam  Constitutional:       Obese, well appearing  Musculoskeletal:       Right hand with + tinel and Phalen sign, not obvious muscle atrophy or weakness.  She has a cock up splint.  Left shoulder-able to fully extend passively with some discomfort, unable to actively extend beyond 90 degrees.  No muscle atrophy, numerous injections that do not help for long. Right heel tender to deep palpation, normal exam          Assessment & Plan:

## 2011-08-30 NOTE — Assessment & Plan Note (Signed)
Would benefit from physical therapy but has not payer source, gave written instructions for some exercises for her to start at home.  Do not think that frequent injections are an answer in this patient with chronic pain.

## 2011-08-30 NOTE — Patient Instructions (Addendum)
Plantar fasciitis is very painfulPlantar Fasciitis Plantar fasciitis is a common condition that causes foot pain. It is soreness (inflammation) of the band of tough fibrous tissue on the bottom of the foot that runs from the heel bone (calcaneus) to the ball of the foot. The cause of this soreness may be from excessive standing, poor fitting shoes, running on hard surfaces, being overweight, having an abnormal walk, or overuse (this is common in runners) of the painful foot or feet. It is also common in aerobic exercise dancers and ballet dancers.  SYMPTOMS Most people with plantar fasciitis complain of:  Severe pain in the morning on the bottom of their foot especially when taking the first steps out of bed. This pain recedes after a few minutes of walking.   Severe pain is experienced also during walking following a long period of inactivity.   Pain is worse when walking barefoot or up stairs  DIAGNOSIS  Your caregiver will diagnose this condition by examining and feeling your foot.   Special tests such as x-rays of your foot, are usually not needed.  PREVENTION  Consult a sports medicine professional before beginning a new exercise program.   Walking programs offer a good workout. With walking there is a lower chance of overuse injuries common to runners. There is less impact and less jarring of the joints.   Begin all new exercise programs slowly. If problems or pain develop, decrease the amount of time or distance until you are at a comfortable level.   Wear good shoes and replace them regularly.   Stretch your foot and the heel cords at the back of the ankle (Achilles tendon) both before and after exercise.   Run or exercise on even surfaces that are not hard. For example, asphalt is better than pavement.   Do not run barefoot on hard surfaces.   If using a treadmill, vary the incline.   Do not continue to workout if you have foot or joint problems. Seek professional help if  they do not improve.  HOME CARE INSTRUCTIONS  Avoid activities that cause you pain until you recover.   Use ice or cold packs on the problem or painful areas after working out.   Only take over-the-counter or prescription medicines for pain, discomfort, or fever as directed by your caregiver.   Soft shoe inserts or athletic shoes with air or gel sole cushions may be helpful.   If problems continue or become more severe, consult a sports medicine caregiver or your own health care provider. Cortisone is a potent anti-inflammatory medication that may be injected into the painful area. You can discuss this treatment with your caregiver.  MAKE SURE YOU:  Understand these instructions.   Will watch your condition.   Will get help right away if you are not doing well or get worse.  Document Released: 08/13/2001 Document Re-Released: 02/12/2010 [facility] Patient Information 2011 [place], [place].  Wash cloth picking up Rolling cold can   Wrist wear the splint,   Shoulders-walk up the wall Then lift from ear to above head with no weight then do the following exercises Shoulder Exercises   RANGE OF MOTION AND STRETCHING EXERCISES  These exercises may help you when beginning to rehabilitate your injury.  Your symptoms may resolve with or without further involvement from your physician, physical therapist or athletic trainer.  While completing these exercises, remember:   Restoring tissue flexibility helps normal motion to return to the joints.  This allows healthier, less painful  movement and activity.  An effective stretch should be held for at least 30 seconds.  A stretch should never be painful.  You should only feel a gentle lengthening or release in the stretched tissue.      ROM - Pendulum   Bend at the waist so that your __________ arm falls away from your body.  Support yourself with your opposite hand on a solid surface, such as a table or a countertop.  Your __________ arm  should be perpendicular to the ground.  If it is not perpendicular, you need to lean over farther.  Relax the muscles in your __________ arm and shoulder as much as possible.  Gently sway your hips and trunk so they move your __________ arm without any use of your __________ shoulder muscles.  Progress your movements so that your __________ arm moves side to side, then forward and backward, and finally, both clockwise and counterclockwise.  Complete __________ repetitions in each direction.  Many people use this exercise to relieve discomfort in their shoulder as well as to gain range of motion. Repeat __________ times.  Complete this exercise __________ times per day.     STRETCH - Flexion, Standing    Stand with good posture.  With an underhand grip on your __________ hand and an overhand grip on the opposite hand, grasp a broomstick or cane so that your hands are a little more than shoulder-width apart.  Keeping your __________ elbow straight and shoulder muscles relaxed, push the stick with your opposite hand to raise your __________ arm in front of your body and then overhead.  Raise your arm until you feel a stretch in your __________ shoulder, but before you have increased shoulder pain.  Try to avoid shrugging your __________ shoulder as your arm rises by keeping your shoulder blade tucked down and toward your mid-back spine.  Hold __________ seconds.  Slowly return to the starting position.  Repeat __________ times.  Complete this exercise __________ times per day.       STRETCH - Internal Rotation    Place your __________ hand behind your back, palm-up.  Throw a towel or belt over your opposite shoulder.  Grasp the towel/belt with your __________ hand.  While keeping an upright posture, gently pull up on the towel/belt until you feel a stretch in the front of your __________ shoulder.  Avoid shrugging your __________ shoulder as your arm rises by keeping your shoulder blade  tucked down and toward your mid-back spine.    Hold __________.  Release the stretch by lowering your opposite hand.  Repeat __________ times.  Complete this exercise __________ times per day.       STRETCH - External Rotation and Abduction  Stagger your stance through a doorframe.  It does not matter which foot is forward.  As instructed by your physician, physical therapist or athletic trainer, place your hands: l And forearms above your head and on the door frame. l And forearms at head-height and on the door frame. l At elbow-height and on the door frame.  Keeping your head and chest upright and your stomach muscles tight to prevent over-extending your low-back, slowly shift your weight onto your front foot until you feel a stretch across your chest and/or in the front of your shoulders.    Hold __________ seconds.  Shift your weight to your back foot to release the stretch. Repeat __________ times.  Complete this stretch __________ times per day.      STRENGTHENING  EXERCISES  These exercises may help you when beginning to rehabilitate your injury.  They may resolve your symptoms with or without further involvement from your physician, physical therapist or athletic trainer.  While completing these exercises, remember:   Muscles can gain both the endurance and the strength needed for everyday activities through controlled exercises.  Complete these exercises as instructed by your physician, physical therapist or athletic trainer.  Progress the resistance and repetitions only as guided.  You may experience muscle soreness or fatigue, but the pain or discomfort you are trying to eliminate should never worsen during these exercises.  If this pain does worsen, stop and make certain you are following the directions exactly.  If the pain is still present after adjustments, discontinue the exercise until you can discuss the trouble with your clinician.  If advised by your physician, during  your recovery, avoid activity or exercises which involve actions that place your __________ hand or elbow above your head or behind your back or head.  These positions stress the tissues which are trying to heal.      STRENGTH - Scapular Depression and Adduction   With good posture, sit on a firm chair.  Supported your arms in front of you with pillows, arm rests or a table top.  Have your elbows in line with the sides of your body.  Gently draw your shoulder blades down and toward your mid-back spine.  Gradually increase the tension without tensing the muscles along the top of your shoulders and the back of your neck.  Hold for __________ seconds.  Slowly release the tension and relax your muscles completely before completing the next repetition.  After you have practiced this exercise, remove the arm support and complete it in standing as well as sitting. Repeat __________ times.  Complete this exercise __________ times per day.      STRENGTH - External Rotators   Secure a rubber exercise band/tubing to a fixed object so that it is at the same height as your __________ elbow when you are standing or sitting on a firm surface.  Stand or sit so that the secured exercise band/tubing is at your side that is not injured.    Bend your elbow 90 degrees.  Place a folded towel or small pillow under your __________ arm so that your elbow is a few inches away from your side.  Keeping the tension on the exercise band/tubing, pull it away from your body, as if pivoting on your elbow.  Be sure to keep your body steady so that the movement is only coming from your shoulder rotating.  Hold __________ seconds.  Release the tension in a controlled manner as you return to the starting position.  Repeat __________ times. Complete this exercise __________ times per day.      STRENGTH - Supraspinatus   Stand or sit with good posture.  Grasp a __________ lb weight or an exercise band/tubing so that your  hand is "thumbs-up," like when you shake hands.  Slowly lift your __________ hand from your thigh into the air, traveling about 30 degrees from straight out at your side.  Lift your hand to shoulder height or as far as you can without increasing any shoulder pain.  Initially, many people do not lift their hands above shoulder height.    Avoid shrugging your __________ shoulder as your arm rises by keeping your shoulder blade tucked down and toward your mid-back spine.    Hold for __________ seconds.  Control the descent of your hand as you slowly return to your starting position.  Repeat __________ times.  Complete this exercise __________ times per day.      STRENGTH - Shoulder Extensors   Secure a rubber exercise band/tubing so that it is at the height of your shoulders when you are either standing or sitting on a firm arm-less chair.  With a thumbs-up grip, grasp an end of the band/tubing in each hand.  Straighten your elbows and lift your hands straight in front of you at shoulder height.  Step back away from the secured end of band/tubing until it becomes tense.  Squeezing your shoulder blades together, pull your hands down to the sides of your thighs.  Do not allow your hands to go behind you.   Hold for __________ seconds.   Slowly ease the tension on the band/tubing as you reverse the directions and return to the starting position.   Repeat __________ times.  Complete this exercise __________ times per day.      STRENGTH - Scapular Retractors  Secure a rubber exercise band/tubing so that it is at the height of your shoulders when you are either standing or sitting on a firm arm-less chair.  With a palm-down grip, grasp an end of the band/tubing in each hand.  Straighten your elbows and lift your hands straight in front of you at shoulder height.  Step back away from the secured end of band/tubing until it becomes tense.  Squeezing your shoulder blades together, draw your elbows  back as you bend them.  Keep your upper arm lifted away from your body throughout the exercise.  Hold __________ seconds.  Slowly ease the tension on the band/tubing as you reverse the directions and return to the starting position.   Repeat __________ times.  Complete this exercise __________ times per day.     STRENGTH - Scapular Depressors  Find a sturdy chair without wheels, such as a from a dining room table.  Keeping your feet on the floor, lift your bottom from the seat and lock your elbows.  Keeping your elbows straight, allow gravity to pull your body weight down.  Your shoulders will rise toward your ears.  Raise your body against gravity by drawing your shoulder blades down your back, shortening the distance between your shoulders and ears.  Although your feet should always maintain contact with the floor, your feet should progressively support less body weight as you get stronger.    Hold __________ seconds.  In a controlled and slow manner, lower your body weight to begin the next repetition. Repeat __________ times.  Complete this exercise __________ times per day.                                            Document Released: 10/02/2005  Document Re-Released: 09/14/2009 [facility] Patient Information 2011 [place], [place].

## 2011-09-02 LAB — CBC
Hemoglobin: 12.3
RDW: 19.5 — ABNORMAL HIGH
WBC: 8.7

## 2011-09-04 LAB — POCT PREGNANCY, URINE: Preg Test, Ur: NEGATIVE

## 2011-09-10 ENCOUNTER — Telehealth: Payer: Self-pay | Admitting: Family Medicine

## 2011-09-10 NOTE — Telephone Encounter (Signed)
Will ask Dr.Hale for advise. Jenna [name], Jenna [name]

## 2011-09-10 NOTE — Telephone Encounter (Signed)
Ms. Jenna [name] is calling because she has a cold and would like to know what over the counter she can take with the medications she is currently on.

## 2011-09-10 NOTE — Telephone Encounter (Signed)
She should avoid decongestants, but can take Loratidine for congestion and Robitussin DM for cough

## 2011-09-10 NOTE — Telephone Encounter (Signed)
Called pt and advised. Pt already went to the pharmacy and pharmacist recommended meds safe for HTN .Jenna [name]

## 2011-11-01 ENCOUNTER — Ambulatory Visit: Payer: Self-pay | Admitting: Family Medicine

## 2011-11-12 ENCOUNTER — Ambulatory Visit: Payer: Self-pay | Admitting: Family Medicine

## 2011-11-28 ENCOUNTER — Other Ambulatory Visit: Payer: Self-pay | Admitting: Family Medicine

## 2011-11-28 NOTE — Telephone Encounter (Signed)
Refill request

## 2011-12-06 ENCOUNTER — Encounter: Payer: Self-pay | Admitting: Family Medicine

## 2011-12-06 ENCOUNTER — Ambulatory Visit (INDEPENDENT_AMBULATORY_CARE_PROVIDER_SITE_OTHER): Payer: Self-pay | Admitting: Family Medicine

## 2011-12-06 VITALS — BP 138/89 | HR 78 | Ht 65.5 in | Wt 228.0 lb

## 2011-12-06 DIAGNOSIS — G5603 Carpal tunnel syndrome, bilateral upper limbs: Secondary | ICD-10-CM

## 2011-12-06 DIAGNOSIS — G5600 Carpal tunnel syndrome, unspecified upper limb: Secondary | ICD-10-CM

## 2011-12-06 DIAGNOSIS — Z8669 Personal history of other diseases of the nervous system and sense organs: Secondary | ICD-10-CM

## 2011-12-06 DIAGNOSIS — E890 Postprocedural hypothyroidism: Secondary | ICD-10-CM

## 2011-12-06 LAB — TSH: TSH: 0.015 u[IU]/mL — ABNORMAL LOW (ref 0.350–4.500)

## 2011-12-06 NOTE — Assessment & Plan Note (Signed)
Patient complains of renewed seizures. Once she has a [employer], we'll set her up for project access referral for neurology.

## 2011-12-06 NOTE — Progress Notes (Signed)
  Subjective:    Patient ID: Jenna [name], female    DOB: [birth year], 51 y.o.   MRN: [id number]  HPI Seizures-patient states that she had seizures in the past, she had been seen by neurology and started on Depakote. This was taken off because she had not had seizures for a while. Now she states that she has seizures at night while she is sleeping. Her husband witnesses these and says that she has jerking as well as tongue biting. After the seizure she seems to have a long period of confusion. She states that she does not drive at all.  Hypothyroidism-patient has been taking her current dose of Synthroid. At her last visit her dose was decreased. She denies any feelings of tachycardia or anxiety as well as any feelings of dry skin or constipation  Wrist pain-patient has wrist and middle finger pain bilaterally. This is made better slightly with the cock-up splint. She's not wearing it. She hasn't had one for the right hand, which is currently hurting her now to  Review of Systems    denies fevers, chills, nausea vomiting diarrhea Objective:   Physical Exam Vital signs reviewed General appearance - alert, well appearing, and in no distress and oriented to person, place, and time Heart - normal rate, regular rhythm, normal S1, S2, no murmurs, rubs, clicks or gallops Chest - clear to auscultation, no wheezes, rales or rhonchi, symmetric air entry, no tachypnea, retractions or cyanosis EXT-bilateral wrists and hands with normal inspection, palpation. Positive Tinel's sign on the right.       Assessment & Plan:

## 2011-12-06 NOTE — Assessment & Plan Note (Signed)
Carpal tunnel may be related to thyroid disorder. Will check TSH today since TSH was very low at last visit. Will change Synthroid based on TSH.

## 2011-12-06 NOTE — Patient Instructions (Signed)
Please go and get qualified for Jenna [name] We will be able to put in a referral to neurology for your seizures Call us when you have that  For your finger and wrist pain We are checking your thyroid today I will call if that needs to be changed Please wear the splints at nighttime

## 2011-12-06 NOTE — Assessment & Plan Note (Signed)
Will have patient wear splint at night. Will check TSH to make sure that is not interfering.

## 2011-12-09 ENCOUNTER — Telehealth: Payer: Self-pay | Admitting: Family Medicine

## 2011-12-09 MED ORDER — LEVOTHYROXINE SODIUM 125 MCG PO TABS: 125.0000 ug | ORAL_TABLET | Freq: Every day | ORAL | Status: DC

## 2011-12-09 NOTE — Telephone Encounter (Signed)
callled to let her know to decrease synthroid dose.  She understands

## 2012-02-10 ENCOUNTER — Other Ambulatory Visit: Payer: Self-pay | Admitting: Family Medicine

## 2012-02-10 NOTE — Telephone Encounter (Signed)
Refill request

## 2012-04-06 ENCOUNTER — Other Ambulatory Visit: Payer: Self-pay | Admitting: Family Medicine

## 2012-04-07 ENCOUNTER — Encounter: Payer: Self-pay | Admitting: Family Medicine

## 2012-04-07 ENCOUNTER — Ambulatory Visit (INDEPENDENT_AMBULATORY_CARE_PROVIDER_SITE_OTHER): Payer: Self-pay | Admitting: Family Medicine

## 2012-04-07 VITALS — BP 148/82 | HR 89 | Temp 98.6°F | Ht 65.5 in | Wt 238.6 lb

## 2012-04-07 DIAGNOSIS — R569 Unspecified convulsions: Secondary | ICD-10-CM

## 2012-04-07 LAB — COMPREHENSIVE METABOLIC PANEL
ALT: 18 U/L (ref 0–35)
AST: 16 U/L (ref 0–37)
Albumin: 4.1 g/dL (ref 3.5–5.2)
Alkaline Phosphatase: 69 U/L (ref 39–117)
Glucose, Bld: 118 mg/dL — ABNORMAL HIGH (ref 70–99)
Potassium: 3.3 mEq/L — ABNORMAL LOW (ref 3.5–5.3)
Sodium: 144 mEq/L (ref 135–145)
Total Bilirubin: 0.4 mg/dL (ref 0.3–1.2)
Total Protein: 6.9 g/dL (ref 6.0–8.3)

## 2012-04-07 LAB — CBC
HCT: 42.7 % (ref 36.0–46.0)
Hemoglobin: 14.8 g/dL (ref 12.0–15.0)
MCHC: 34.7 g/dL (ref 30.0–36.0)
RBC: 5.48 MIL/uL — ABNORMAL HIGH (ref 3.87–5.11)
RDW: 15.8 % — ABNORMAL HIGH (ref 11.5–15.5)

## 2012-04-07 LAB — TSH: TSH: 0.666 u[IU]/mL (ref 0.350–4.500)

## 2012-04-07 NOTE — Patient Instructions (Addendum)
Unfortunately, I am not sure what is going on with your spells I would like you evaluated by Neurology to see if this is seizure. If not, we can work on anxiety as a possible cause.  Please do not drive until we know for sure  We will call you for the neurology appt Please come back to check in in 2 weeks

## 2012-04-10 ENCOUNTER — Telehealth: Payer: Self-pay | Admitting: Family Medicine

## 2012-04-10 NOTE — Telephone Encounter (Signed)
Pt informed of the labs.  Unable to pay out of pocket at this time for neurology.  Pt will check in with [facility] next week, then she will let us know about next step. Riann Oman, [name]

## 2012-04-10 NOTE — Progress Notes (Signed)
  Subjective:    Patient ID: Jenna [name], female    DOB: [birth year], 51 y.o.   MRN: [id number]  HPI Patient presents for evaluation of spells where she will have trouble understanding what people are saying and she feels like her vision gets blurry. She says that these last 2-3 minutes. Afterwards her speech is fine and she has no weakness but she is nervous. These bother her because afterwards she feels scared and does not want to be by herself. She is never lost bowel control but one time she did lose bladder control during the spell. She says it is a been going on for years but they seem more frequent recently. She denies palpitations, shortness breath, chest pain. She denies any weakness or vision change or headache when she is not having spell. She saw a neurologist in the past here in [place] but has not seen one recently.   Review of Systems No nausea, vomiting, diarrhea. No fevers.    Objective:   Physical Exam  Vital signs reviewed General appearance - alert, well appearing, and in no distress Heart - normal rate, regular rhythm, normal S1, S2, no murmurs, rubs, clicks or gallops Chest - clear to auscultation, no wheezes, rales or rhonchi, symmetric air entry, no tachypnea, retractions or cyanosis Neurological - alert, oriented, normal speech, no focal findings or movement disorder noted, screening mental status exam normal, neck supple without rigidity, cranial nerves II through XII intact       Assessment & Plan:

## 2012-04-10 NOTE — Telephone Encounter (Signed)
Please let patient know that her labs are normal. Let her know we are working on a referral for neurology

## 2012-04-10 NOTE — Assessment & Plan Note (Signed)
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 Has been seen by [facility] in the past. Will ask them to reevaluate her now. Also, we'll check a CBG, CBC, CMP, TSH to look for any abnormalities. If these are not seizures, would consider anxiety attacks as possible diagnosis.

## 2012-04-12 NOTE — Telephone Encounter (Signed)
Can we refer her to [facility]?

## 2012-04-13 NOTE — Telephone Encounter (Signed)
I asked her about that, she said she would rather try for [facility] first.  But I did inform her that if the neurologist here would not accept Jaynee Eagles she would have to go to [place] or [place].  Pt still wanted to try for Jaynee Eagles first. Adell Koval, [name]

## 2012-09-07 ENCOUNTER — Other Ambulatory Visit: Payer: Self-pay | Admitting: *Deleted

## 2012-09-09 ENCOUNTER — Telehealth: Payer: Self-pay | Admitting: Family Medicine

## 2012-09-09 NOTE — Telephone Encounter (Signed)
Jenna [name] has made an appt to meet new dr, Dr. Karie Schwalbe, but will be out of her thyroid meds before 11/5. Would like Dr. Karie Schwalbe to send a rx for meds to cover her until 11/5.

## 2012-09-11 MED ORDER — LEVOTHYROXINE SODIUM 125 MCG PO TABS: 125.0000 ug | ORAL_TABLET | Freq: Every day | ORAL | Status: DC

## 2012-09-15 NOTE — Telephone Encounter (Signed)
Thank you, please let me know if anything else needs to be done to take care of this.

## 2012-10-06 ENCOUNTER — Ambulatory Visit: Payer: Self-pay | Admitting: Family Medicine

## 2012-10-20 ENCOUNTER — Encounter: Payer: Self-pay | Admitting: Family Medicine

## 2012-10-20 ENCOUNTER — Ambulatory Visit (INDEPENDENT_AMBULATORY_CARE_PROVIDER_SITE_OTHER): Payer: Self-pay | Admitting: Family Medicine

## 2012-10-20 VITALS — BP 151/98 | HR 72 | Temp 98.3°F | Ht 66.0 in | Wt 241.9 lb

## 2012-10-20 DIAGNOSIS — Z0000 Encounter for general adult medical examination without abnormal findings: Secondary | ICD-10-CM

## 2012-10-20 DIAGNOSIS — E890 Postprocedural hypothyroidism: Secondary | ICD-10-CM

## 2012-10-20 DIAGNOSIS — I10 Essential (primary) hypertension: Secondary | ICD-10-CM

## 2012-10-20 DIAGNOSIS — S6990XA Unspecified injury of unspecified wrist, hand and finger(s), initial encounter: Secondary | ICD-10-CM

## 2012-10-20 DIAGNOSIS — S6991XA Unspecified injury of right wrist, hand and finger(s), initial encounter: Secondary | ICD-10-CM

## 2012-10-20 DIAGNOSIS — Z8669 Personal history of other diseases of the nervous system and sense organs: Secondary | ICD-10-CM

## 2012-10-20 DIAGNOSIS — M79641 Pain in right hand: Secondary | ICD-10-CM | POA: Insufficient documentation

## 2012-10-20 MED ORDER — AMLODIPINE BESYLATE 5 MG PO TABS: 5.0000 mg | ORAL_TABLET | Freq: Every day | ORAL | Status: DC

## 2012-10-20 MED ORDER — LEVOTHYROXINE SODIUM 125 MCG PO TABS: 125.0000 ug | ORAL_TABLET | Freq: Every day | ORAL | Status: DC
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
## 2012-10-20 NOTE — Assessment & Plan Note (Addendum)
Previously diagnosed as seizure vs pseudoseizure, but with current concern for partial v generalized seizure activity.  Anxiety component also suspected.  Dr Ayesha Mohair at last visit recommended neuro referral but patient did not make it to this.  Patient does not drive. - Meeting patient for first time - Did not go to neuro f/u because has no medicaid and cannot afford.   - Did not f/u on [employer]. Recommending follow-up.  Once has this, I will re-make neurology referral - Has been off anticonvulsants since 2010 - Regarding request for disability, recommended patient go to Social Security office to investigate, receive medical records from [place] if needed by signing med release forms - Follow up in 2 weeks if needed/if symptoms worsen and ED if has another seizure

## 2012-10-20 NOTE — Assessment & Plan Note (Signed)
04/2012 TSH 0.66, and patient with no symptoms of thyroid dysfunction currently. - Refilled synthroid

## 2012-10-20 NOTE — Patient Instructions (Signed)
It was good to meet you today. - For your shaking spells, I am still not sure what is going on.  You would really benefit from a neurology visit - let me know if you want to reconsider this.  Continue to not drive. - You asked about disability.  To investigate this, go to [employer] office and they will have forms for you to fill out.  You can fill out a release of medical records from [place] if you need that. - F/u with me in 2 weeks if this is not resolving.  Your right hand likely has bone bruising and this can take up to 1 month to feel better. - We can check on this in 2 weeks if not improving. - I do not recommend an x ray at this time - Take tylenol as needed to help with any pain.  For your blood pressure, I recommend taking medications daily.

## 2012-10-20 NOTE — Assessment & Plan Note (Signed)
Patient with BP elevated today to 150s/90s - Recommended that patient takes BP medications daily as prescribed - Refilled norvasc 5mg  daily
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
## 2012-12-10 ENCOUNTER — Telehealth: Payer: Self-pay | Admitting: Family Medicine

## 2012-12-10 NOTE — Telephone Encounter (Signed)
Called to follow up with patient if she has been able to get [facility] appointment - she has not contacted Britta Mccreedy to set this up.  Reminded pt that if she gets approved, I can refer her for neurology evaluation which may be better covered with [facility] (though certain what cost would be to pt).  Also reminded her to f/u with [facility] for her chronic medical conditions as she has no f/u scheduled.  Pt verbalized understanding.

## 2013-01-26 ENCOUNTER — Encounter: Payer: Self-pay | Admitting: Family Medicine

## 2013-01-26 ENCOUNTER — Ambulatory Visit (INDEPENDENT_AMBULATORY_CARE_PROVIDER_SITE_OTHER): Payer: Self-pay | Admitting: Family Medicine

## 2013-01-26 VITALS — BP 157/93 | HR 76 | Temp 98.4°F | Ht 66.0 in | Wt 250.9 lb

## 2013-01-26 DIAGNOSIS — M940 Chondrocostal junction syndrome [Tietze]: Secondary | ICD-10-CM

## 2013-01-26 MED ORDER — IBUPROFEN 600 MG PO TABS: 600.0000 mg | ORAL_TABLET | Freq: Three times a day (TID) | ORAL | Status: DC | PRN

## 2013-01-26 NOTE — Patient Instructions (Addendum)
Take the 600 mg of Ibuprofen when you need it (before sleep or exercise).  Come back for a lab visit once you have the [employer].    If the medicine makes the pain worse, let us know.    Come back in 2-4 weeks to make sure you're doing okay.  Costochondritis Costochondritis (Tietze syndrome), or costochondral separation, is a swelling and irritation (inflammation) of the tissue (cartilage) that connects your ribs with your breastbone (sternum). It may occur on its own (spontaneously), through damage caused by an accident (trauma), or simply from coughing or minor exercise. It may take up to 6 weeks to get better and longer if you are unable to be conservative in your activities. HOME CARE INSTRUCTIONS   Avoid exhausting physical activity. Try not to strain your ribs during normal activity. This would include any activities using chest, belly (abdominal), and side muscles, especially if heavy weights are used.  Use ice for 15 to 20 minutes per hour while awake for the first 2 days. Place the ice in a plastic bag, and place a towel between the bag of ice and your skin.  Only take over-the-counter or prescription medicines for pain, discomfort, or fever as directed by your caregiver. SEEK IMMEDIATE MEDICAL CARE IF:   Your pain increases or you are very uncomfortable.  You have a fever.  You develop difficulty with your breathing.  You cough up blood.  You develop worse chest pains, shortness of breath, sweating, or vomiting.  You develop new, unexplained problems (symptoms). MAKE SURE YOU:   Understand these instructions.  Will watch your condition.  Will get help right away if you are not doing well or get worse. Document Released: 08/28/2005 Document Revised: 02/10/2012 Document Reviewed: 07/06/2008 [facility] Patient Information 2013 [place], [place].

## 2013-01-26 NOTE — Progress Notes (Signed)
Jenna [name] is a 52 y.o. female who presents to [facility] today with complaints of stomach pain:  1.  Stomach pain:  Present for past month.  Worse when lying flat or raising arm above her head.  No radiation.  Helps with splinting (holding arm against her side).  Can walk for 1/2 to 1 mile without chest pain/shortness of breath.  Participates in dance classes without any problems, but has not started her dance classes yet (not scheduled).  Concerned dance will make the pain worse.  States pain occaionally wraps around to her back with arm movement.    No triggers or inciting incidents.  No fevers/chills.  No N/V.  No hematemesis.  Does report 3-4 episodes of diarrhea a day, which is chronic for her for months.  Has not tried taking anything for relief.    The following portions of the patient's history were reviewed and updated as appropriate: allergies, current medications, past medical history, family and social history, and problem list.  Patient is a nonsmoker.    No past medical history on file. Past Surgical History  Procedure Laterality Date  . Total thyroidectomy  2010    primary hyperparathyroidism  . Parathyroidectomy  2010    primary hyperparpthyroidism  . Uterine ablation  2009    menorrhagia and severe anemia    Medications reviewed. Current Outpatient Prescriptions  Medication Sig Dispense Refill  . amLODipine (NORVASC) 5 MG tablet Take 1 tablet (5 mg total) by mouth daily.  30 tablet  11  . calcium carbonate (OS-CAL) 600 MG TABS 3 (three) times daily.        . Cholecalciferol (VITAMIN D3) 2000 UNITS TABS Take 1 tablet by mouth daily.        Marland Kitchen ibuprofen (ADVIL,MOTRIN) 600 MG tablet Take 1 tablet (600 mg total) by mouth every 8 (eight) hours as needed for pain.  30 tablet  0  . levothyroxine (SYNTHROID, LEVOTHROID) 125 MCG tablet Take 1 tablet (125 mcg total) by mouth daily. Dosage change  30 tablet  6   No current facility-administered medications for this visit.    ROS  as above otherwise neg.    Physical Exam:  BP 157/93  Pulse 76  Temp(Src) 98.4 F (36.9 C)  Ht 5\' 6"  (1.676 m)  Wt 250 lb 14.4 oz (113.807 kg)  BMI 40.52 kg/m2 Gen:  Alert, cooperative patient who appears stated age in no acute distress.  Vital signs reviewed. HEENT: EOMI,  MMM Pulm:  Clear to auscultation bilaterally with good air movement.  No wheezes or rales noted.   Cardiac:  Regular rate and rhythm without murmur auscultated.  Good S1/S2. Abd:  Soft/nondistended.  TTP LUQ.  Good bowel sounds throughout all four quadrants.  No masses noted.  MSK:  Tender along 11th and 12th rib extending to mid-axillary line.  No back tenderness.    No results found for this or any previous visit (from the past 72 hour(s)).

## 2013-01-26 NOTE — Assessment & Plan Note (Signed)
Most likely etiology. Not on any NSAIDs currently, no bleeding, less likely an ulcer. Not cardaic pain -- not associated with exertion/SOB.   Note PCP's concern over Ibuprofen.  Will prescribe 600 rather than full 800 mg as short-term NSAID use will be best thing to help with inflammation of her ribs.   FU 2-4 weeks, sooner if worsening or no improvement.

## 2013-02-09 ENCOUNTER — Encounter: Payer: Self-pay | Admitting: Family Medicine

## 2013-02-09 DIAGNOSIS — E890 Postprocedural hypothyroidism: Secondary | ICD-10-CM

## 2013-02-16 ENCOUNTER — Ambulatory Visit: Payer: Self-pay | Admitting: Family Medicine

## 2013-03-11 ENCOUNTER — Telehealth: Payer: Self-pay | Admitting: Family Medicine

## 2013-03-11 DIAGNOSIS — E890 Postprocedural hypothyroidism: Secondary | ICD-10-CM

## 2013-03-11 NOTE — Telephone Encounter (Signed)
Pt also wanted appt with MD to discuss her stomach pain.  Scheduled for the 22nd for stomach and TSH levels. Millisa Giarrusso, [name]

## 2013-03-11 NOTE — Telephone Encounter (Signed)
BLUE TEAM: Pt is due for recheck of her TSH this month due to her pharmacy changing manufacturer for levothyroxin tablet that I okayed 02/09/13. Pls call her and have her schedule this. I already ordered it as "future" back in March. Thank you.

## 2013-03-23 ENCOUNTER — Ambulatory Visit: Payer: Self-pay | Admitting: Family Medicine

## 2013-04-01 ENCOUNTER — Ambulatory Visit: Payer: Self-pay | Admitting: Family Medicine

## 2013-04-30 ENCOUNTER — Ambulatory Visit: Payer: Self-pay | Admitting: Family Medicine

## 2013-05-14 ENCOUNTER — Ambulatory Visit (INDEPENDENT_AMBULATORY_CARE_PROVIDER_SITE_OTHER): Payer: Self-pay | Admitting: Family Medicine

## 2013-05-14 ENCOUNTER — Encounter: Payer: Self-pay | Admitting: Family Medicine

## 2013-05-14 VITALS — BP 154/98 | HR 88 | Temp 98.1°F | Ht 66.0 in | Wt 254.0 lb

## 2013-05-14 DIAGNOSIS — E890 Postprocedural hypothyroidism: Secondary | ICD-10-CM

## 2013-05-14 DIAGNOSIS — I10 Essential (primary) hypertension: Secondary | ICD-10-CM

## 2013-05-14 DIAGNOSIS — R635 Abnormal weight gain: Secondary | ICD-10-CM

## 2013-05-14 LAB — BASIC METABOLIC PANEL
CO2: 28 mEq/L (ref 19–32)
Chloride: 105 mEq/L (ref 96–112)
Creat: 1.1 mg/dL (ref 0.50–1.10)
Glucose, Bld: 143 mg/dL — ABNORMAL HIGH (ref 70–99)
Sodium: 143 mEq/L (ref 135–145)

## 2013-05-14 LAB — TSH: TSH: 31.125 u[IU]/mL — ABNORMAL HIGH (ref 0.350–4.500)

## 2013-05-14 MED ORDER — LISINOPRIL 5 MG PO TABS: 5.0000 mg | ORAL_TABLET | Freq: Every day | ORAL | Status: DC

## 2013-05-14 NOTE — Assessment & Plan Note (Signed)
TSH today. Changed to different manufacturer of levothryoxine and with narrow therapeutic index, needed repeat several months ago. No signs clinically that she is hypo or hyperthyroid.

## 2013-05-14 NOTE — Assessment & Plan Note (Addendum)
Poorly controlled. Last 3 readings with elevated SBP and DBP. Will add lisinopril (preference for hctz but has had low potassium several times in the past on bmet). BMET today and return in 2 weeks for bp check and repeat bmet.

## 2013-05-14 NOTE — Progress Notes (Signed)
Subjective:  Same day appointment for weight gain  # Hypothyroidism/weight gain Patient is worried that her 50 lbs of weight gain in 3 years have been related to an underfunctioning thyroid. TSH in this time has primarily been well controlled or even low (with exception of read on 1/27). She does endorse at least 4 sodas per day and regularly eating out when she is on the road with her husband as well as no exercise. Last TSH was over a year ago but she has been compliant with levothyroxine. She did not come in April for TSH check as instructed.   No hair or nail changes. No heat/cold intolerance. No constipation or diarrhea (regular and soft). Denies shakiness or anxiety. Gained 3 lb in 3.5 months. No palpitations.   # Hypertension- BP Readings from Last 3 Encounters:  05/14/13 154/98  01/26/13 157/93  10/20/12 151/98  Home BP monitoring-no Compliant with medications-yes without side effects (amlodipine only) Denies any CP, HA, SOB, blurry vision, LE edema, transient weakness, orthopnea, PND.   ROS--See HPI  Past Medical History-history seizures, hyperparathyroidism, postsurgical hypothyroidism Reviewed problem list.  Medications- reviewed and updated Chief complaint-noted  Objective: BP 154/98  Pulse 88  Temp(Src) 98.1 F (36.7 C) (Oral)  Ht 5\' 6"  (1.676 m)  Wt 254 lb (115.214 kg)  BMI 41.02 kg/m2 Gen: NAD, resting comfortably in chair CV: RRR no murmurs rubs or gallops Lungs: CTAB no crackles, wheeze, rhonchi Skin: warm, dry, no hair loss or nail changes Neuro: grossly normal, moves all extremities  Assessment/Plan:  Weight gain-likely due to poor lifestyle choices (soda, eating out, no exercise). Advised patient to make positive changes. She is to follow up with PCP or me in 2 weeks. Mailed patient a letter with ideas as this was not included in AVS as intended.

## 2013-05-14 NOTE — Patient Instructions (Addendum)
1. We will check your thyroid level today. We will call if you need adjustments of medications. Your weight gain is likely due to your regular soda intake and lack of exercise. See instructions below.   2. Your blood pressure is high today, we need to add a new medication called lisinopril. Follow up in 2 weeks for blood pressure recheck and blood draw (check your potassium and kidney function).   See either me or Dr. Karie Schwalbe in 2 weeks,  Dr. Durene Cal

## 2013-05-24 ENCOUNTER — Emergency Department (HOSPITAL_COMMUNITY)
Admission: EM | Admit: 2013-05-24 | Discharge: 2013-05-24 | Disposition: A | Discharge status: Home / Self Care | Payer: Self-pay | Attending: Emergency Medicine | Admitting: Emergency Medicine

## 2013-05-24 ENCOUNTER — Encounter (HOSPITAL_COMMUNITY): Payer: Self-pay | Admitting: *Deleted

## 2013-05-24 DIAGNOSIS — Y9289 Other specified places as the place of occurrence of the external cause: Secondary | ICD-10-CM | POA: Insufficient documentation

## 2013-05-24 DIAGNOSIS — E079 Disorder of thyroid, unspecified: Secondary | ICD-10-CM | POA: Insufficient documentation

## 2013-05-24 DIAGNOSIS — T25222A Burn of second degree of left foot, initial encounter: Secondary | ICD-10-CM

## 2013-05-24 DIAGNOSIS — I10 Essential (primary) hypertension: Secondary | ICD-10-CM | POA: Insufficient documentation

## 2013-05-24 DIAGNOSIS — Y9389 Activity, other specified: Secondary | ICD-10-CM | POA: Insufficient documentation

## 2013-05-24 DIAGNOSIS — Z8669 Personal history of other diseases of the nervous system and sense organs: Secondary | ICD-10-CM | POA: Insufficient documentation

## 2013-05-24 DIAGNOSIS — T24219A Burn of second degree of unspecified thigh, initial encounter: Secondary | ICD-10-CM | POA: Insufficient documentation

## 2013-05-24 DIAGNOSIS — Z87891 Personal history of nicotine dependence: Secondary | ICD-10-CM | POA: Insufficient documentation

## 2013-05-24 DIAGNOSIS — T25229A Burn of second degree of unspecified foot, initial encounter: Secondary | ICD-10-CM | POA: Insufficient documentation

## 2013-05-24 DIAGNOSIS — T24211A Burn of second degree of right thigh, initial encounter: Secondary | ICD-10-CM

## 2013-05-24 DIAGNOSIS — Z23 Encounter for immunization: Secondary | ICD-10-CM | POA: Insufficient documentation

## 2013-05-24 DIAGNOSIS — Z79899 Other long term (current) drug therapy: Secondary | ICD-10-CM | POA: Insufficient documentation

## 2013-05-24 DIAGNOSIS — X12XXXA Contact with other hot fluids, initial encounter: Secondary | ICD-10-CM | POA: Insufficient documentation

## 2013-05-24 HISTORY — DX: Disorder of thyroid, unspecified: E07.9

## 2013-05-24 HISTORY — DX: Unspecified convulsions: R56.9

## 2013-05-24 HISTORY — DX: Essential (primary) hypertension: I10

## 2013-05-24 MED ORDER — SILVER SULFADIAZINE 1 % EX CREA: TOPICAL_CREAM | Freq: Every day | CUTANEOUS | Status: DC

## 2013-05-24 MED ORDER — TETANUS-DIPHTH-ACELL PERTUSSIS 5-2.5-18.5 LF-MCG/0.5 IM SUSP
0.5000 mL | Freq: Once | INTRAMUSCULAR | Status: AC
  Administered 2013-05-24: 0.5 mL via INTRAMUSCULAR
  Filled 2013-05-24: qty 0.5

## 2013-05-24 MED ORDER — LORAZEPAM 1 MG PO TABS
1.0000 mg | ORAL_TABLET | Freq: Once | ORAL | Status: AC
  Administered 2013-05-24: 1 mg via ORAL
  Filled 2013-05-24: qty 1

## 2013-05-24 MED ORDER — OXYCODONE-ACETAMINOPHEN 5-325 MG PO TABS: 1.0000 | ORAL_TABLET | ORAL | Status: DC | PRN

## 2013-05-24 MED ORDER — HYDROMORPHONE HCL PF 1 MG/ML IJ SOLN
1.0000 mg | Freq: Once | INTRAMUSCULAR | Status: AC
  Administered 2013-05-24: 1 mg via INTRAMUSCULAR
  Filled 2013-05-24: qty 1

## 2013-05-24 MED ORDER — KETOROLAC TROMETHAMINE 60 MG/2ML IM SOLN
60.0000 mg | Freq: Once | INTRAMUSCULAR | Status: AC
  Administered 2013-05-24: 60 mg via INTRAMUSCULAR
  Filled 2013-05-24: qty 2

## 2013-05-24 MED ORDER — CEPHALEXIN 500 MG PO CAPS: 500.0000 mg | ORAL_CAPSULE | Freq: Two times a day (BID) | ORAL | Status: DC

## 2013-05-24 MED ORDER — IBUPROFEN 600 MG PO TABS: 600.0000 mg | ORAL_TABLET | Freq: Four times a day (QID) | ORAL | Status: DC | PRN

## 2013-05-24 MED ORDER — SILVER SULFADIAZINE 1 % EX CREA
TOPICAL_CREAM | Freq: Once | CUTANEOUS | Status: AC
  Administered 2013-05-24: 23:00:00 via TOPICAL
  Filled 2013-05-24: qty 50

## 2013-05-24 NOTE — ED Provider Notes (Signed)
History    This chart was scribed for non-physician practitioner, Lucretia Kern, working with Derwood Kaplan, MD by Donne Anon, ED Scribe. This patient was seen in room WTR5/WTR5 and the patient's care was started at 2135.  CSN: [id number] Arrival date & time 05/24/13  2033  First MD Initiated Contact with Patient 05/24/13 2135     Chief Complaint  Patient presents with  . Burn    The history is provided by the patient. No language interpreter was used.   HPI Comments: Jenna [name] is a 52 y.o. female who presents to the Emergency Department complaining of a burn from hot water which occurred immediately PTA to her right thigh, right foot and left foot. She states she accidentally spilt boiling water on her legs. She has not washed it or put any medication on the wounds. She denies any other pain at this time. She is unsure when her last Tetanus shot was.     Past Medical History  Diagnosis Date  . Hypertension   . Thyroid disease   . Seizures    Past Surgical History  Procedure Laterality Date  . Total thyroidectomy  2010    primary hyperparathyroidism  . Parathyroidectomy  2010    primary hyperparpthyroidism  . Uterine ablation  2009    menorrhagia and severe anemia   No family history on file. History  Substance Use Topics  . Smoking status: Former Smoker    Types: Cigarettes    Quit date: 06/13/2010  . Smokeless tobacco: Not on file  . Alcohol Use: No   OB History   Grav Para Term Preterm Abortions TAB SAB Ect Mult Living                 Review of Systems  Skin: Positive for wound.  All other systems reviewed and are negative.    Allergies  Review of patient's allergies indicates no known allergies.  Home Medications   Current Outpatient Rx  Name  Route  Sig  Dispense  Refill  . levothyroxine (SYNTHROID, LEVOTHROID) 125 MCG tablet   Oral   Take 1 tablet (125 mcg total) by mouth daily. Dosage change   30 tablet   6   . lisinopril  (PRINIVIL,ZESTRIL) 5 MG tablet   Oral   Take 1 tablet (5 mg total) by mouth daily.   30 tablet   5    BP 142/88  Pulse 83  Temp(Src) 98.4 F (36.9 C) (Oral)  Resp 14  SpO2 99%  Physical Exam  Nursing note and vitals reviewed. Constitutional: She appears well-developed and well-nourished. No distress.  HENT:  Head: Normocephalic and atraumatic.  Eyes: Conjunctivae are normal.  Neck: Neck supple. No tracheal deviation present.  Cardiovascular: Normal rate.   Pulmonary/Chest: Effort normal. No respiratory distress.  Musculoskeletal: Normal range of motion.  Neurological: She is alert.  Skin: Skin is warm and dry.  8 x 4 cm first and second degree burn to right anterior thigh. 10 cm x 3 cm second degree burn to left anterior foot with blisters present.   Psychiatric: She has a normal mood and affect. Her behavior is normal.    ED Course  Procedures (including critical care time) DIAGNOSTIC STUDIES: Oxygen Saturation is 99% on room air, normal by my interpretation.    COORDINATION OF CARE: 9:36 PM Discussed treatment plan which includes consult with Dr. Rhunette Croft with pt at bedside and pt agreed to plan. Ordered dilaudid 1mg  IM and toratol 1mg  IM, ativan  1mg  PO.   9:46 PM Case discussed with Dr. Rhunette Croft who saw pt and advised to debride the burn.  10:33 PM Foot debrided.     Procedure note: performed by me Cleaned with iodine topically. Using sterile gloves and equipment, blistered skin pulled from the burned area and trimmed around the edge with scissors. Silvadene cream and sterile dressing applied.  Pt tolerated procedure well.     Labs Reviewed - No data to display No results found.  1. Second degree burn of left foot, initial encounter   2. Second degree burn of right thigh, initial encounter     MDM  Pt with a large left foot 2nd degree/partial thickness burn. Blistered skin removed after cleaning foot with saline and iodine. Silvadene cream applied. At this  time, pt will need very close follow up for further evaluation and treatment of the wound. Tetanus updated.    I personally performed the services described in this documentation, which was scribed in my presence. The recorded information has been reviewed and is accurate.    Lottie Mussel, PA-C 05/25/13 [phone]

## 2013-05-24 NOTE — ED Notes (Signed)
Pt states that she spilled boiling water on her legs; pt with blisters to her left thigh, left foot and rt foot; pt states rt foot is the most painful

## 2013-05-24 NOTE — ED Notes (Signed)
Patient is alert and oriented x3.  She was given DC instructions and follow up visit instructions.  Patient gave verbal understanding. She was DC ambulatory under her own power to home.  V/S stable.  He was not showing any signs of distress on DC 

## 2013-05-25 ENCOUNTER — Telehealth: Payer: Self-pay | Admitting: Family Medicine

## 2013-05-25 DIAGNOSIS — E039 Hypothyroidism, unspecified: Secondary | ICD-10-CM

## 2013-05-25 NOTE — ED Provider Notes (Signed)
Medical screening examination/treatment/procedure(s) were conducted as a shared visit with non-physician practitioner(s) and myself.  I personally evaluated the patient during the encounter.   Pt with cc of burn. Pt has a superficial partial thichknee burn to the right ankle and likely just a 1st degree burn to the left forearm. Wound to be debrided if she can tolerate.    Derwood Kaplan, MD 05/25/13 1447

## 2013-05-25 NOTE — Telephone Encounter (Signed)
Message copied by Shelva Majestic on Tue May 25, 2013  4:33 PM ------      Message from: Simone Curia T      Created: Mon May 24, 2013  4:10 PM       Griffith Citron you saw Ms Culverhouse for this thyroid check. I am happy to call her - just sending to you since you had seen her for this and I think they meant to send to you. With this increase in TSH, I don't know if when you talked to her she had said she was taking meds regularly or  Not...thoughts? Also, let me know if you would rather I call and discuss with her.            Byrd Hesselbach                  ----- Message -----         From: [facility] Interface         Sent: 05/14/2013  11:01 PM           To: Leona Singleton, MD                   ------

## 2013-05-25 NOTE — Telephone Encounter (Signed)
Spoke with patient and she now admits that she misses at least one dose of her medicine a week. I have instructed her to take it daily and to follow up in 6 weeks for recheck.   6 doses of 125 is essentially 107 mcg daily so does not make sense to increase to 137, instead patient should take medication regularly and if she simply cannot-we will increase to 137.

## 2013-05-26 ENCOUNTER — Encounter (HOSPITAL_COMMUNITY): Payer: Self-pay | Admitting: Emergency Medicine

## 2013-05-26 ENCOUNTER — Emergency Department (HOSPITAL_COMMUNITY)
Admission: EM | Admit: 2013-05-26 | Discharge: 2013-05-26 | Disposition: A | Discharge status: Home / Self Care | Payer: Self-pay | Attending: Emergency Medicine | Admitting: Emergency Medicine

## 2013-05-26 DIAGNOSIS — Z4800 Encounter for change or removal of nonsurgical wound dressing: Secondary | ICD-10-CM | POA: Insufficient documentation

## 2013-05-26 DIAGNOSIS — I10 Essential (primary) hypertension: Secondary | ICD-10-CM | POA: Insufficient documentation

## 2013-05-26 DIAGNOSIS — Z87891 Personal history of nicotine dependence: Secondary | ICD-10-CM | POA: Insufficient documentation

## 2013-05-26 DIAGNOSIS — Z8669 Personal history of other diseases of the nervous system and sense organs: Secondary | ICD-10-CM | POA: Insufficient documentation

## 2013-05-26 DIAGNOSIS — Z5189 Encounter for other specified aftercare: Secondary | ICD-10-CM

## 2013-05-26 DIAGNOSIS — E079 Disorder of thyroid, unspecified: Secondary | ICD-10-CM | POA: Insufficient documentation

## 2013-05-26 DIAGNOSIS — Z79899 Other long term (current) drug therapy: Secondary | ICD-10-CM | POA: Insufficient documentation

## 2013-05-26 MED ORDER — SILVER SULFADIAZINE 1 % EX CREA
TOPICAL_CREAM | Freq: Once | CUTANEOUS | Status: DC
  Filled 2013-05-26: qty 50

## 2013-05-26 MED ORDER — SILVER SULFADIAZINE 1 % EX CREA: TOPICAL_CREAM | Freq: Every day | CUTANEOUS | Status: DC

## 2013-05-26 NOTE — ED Notes (Signed)
Pt states she burned her left foot with hot water on Monday and was seen in the ED here. Pt was told to change the dressing 2x/day but is concerned about the amount of drainage. Pt states that when she changes the dressing it is saturated. Upon assessment, wound is oozing small amount of serosanguinous fluid. Tissue is pink and granulated.

## 2013-05-26 NOTE — ED Provider Notes (Signed)
History    CSN: [id number] Arrival date & time 05/26/13  7829  First MD Initiated Contact with Patient 05/26/13 2132     Chief Complaint  Patient presents with  . Wound Check   (Consider location/radiation/quality/duration/timing/severity/associated sxs/prior Treatment) HPI Comments: Patient presents emergency department with chief complaint of wound check. She states that she was burned with hot water, and was seen here recently. Was told to change dressings twice daily. She's been using Silvadene with good relief. She is concerned about some discharge that she has seen. She denies fevers or chills. Nothing makes her symptoms worse.  The history is provided by the patient. No language interpreter was used.   Past Medical History  Diagnosis Date  . Hypertension   . Thyroid disease   . Seizures    Past Surgical History  Procedure Laterality Date  . Total thyroidectomy  2010    primary hyperparathyroidism  . Parathyroidectomy  2010    primary hyperparpthyroidism  . Uterine ablation  2009    menorrhagia and severe anemia   No family history on file. History  Substance Use Topics  . Smoking status: Former Smoker    Types: Cigarettes    Quit date: 06/13/2010  . Smokeless tobacco: Not on file  . Alcohol Use: No   OB History   Grav Para Term Preterm Abortions TAB SAB Ect Mult Living                 Review of Systems  All other systems reviewed and are negative.    Allergies  Review of patient's allergies indicates no known allergies.  Home Medications   Current Outpatient Rx  Name  Route  Sig  Dispense  Refill  . cephALEXin (KEFLEX) 500 MG capsule   Oral   Take 1 capsule (500 mg total) by mouth 2 (two) times daily.   20 capsule   0   . ibuprofen (ADVIL,MOTRIN) 600 MG tablet   Oral   Take 1 tablet (600 mg total) by mouth every 6 (six) hours as needed for pain.   30 tablet   0   . levothyroxine (SYNTHROID, LEVOTHROID) 125 MCG tablet   Oral   Take 1  tablet (125 mcg total) by mouth daily. Dosage change   30 tablet   6   . lisinopril (PRINIVIL,ZESTRIL) 5 MG tablet   Oral   Take 1 tablet (5 mg total) by mouth daily.   30 tablet   5   . oxyCODONE-acetaminophen (PERCOCET) 5-325 MG per tablet   Oral   Take 1 tablet by mouth every 4 (four) hours as needed for pain.   20 tablet   0   . silver sulfADIAZINE (SILVADENE) 1 % cream   Topical   Apply 1 application topically 2 (two) times daily as needed (for wound care; applied to foot).         . silver sulfADIAZINE (SILVADENE) 1 % cream   Topical   Apply topically daily.   50 g   2    BP 150/89  Pulse 68  Temp(Src) 98.7 F (37.1 C) (Oral)  Resp 15  Ht 5\' 6"  (1.676 m)  Wt 250 lb (113.399 kg)  BMI 40.37 kg/m2  SpO2 94% Physical Exam  Nursing note and vitals reviewed. Constitutional: She is oriented to person, place, and time. She appears well-developed and well-nourished.  HENT:  Head: Normocephalic and atraumatic.  Eyes: Conjunctivae and EOM are normal.  Neck: Normal range of motion.  Cardiovascular: Normal  rate.   Pulmonary/Chest: Effort normal.  Abdominal: She exhibits no distension.  Musculoskeletal: Normal range of motion.  Neurological: She is alert and oriented to person, place, and time.  Skin: Skin is dry.     Well-healing burn to the dorsal aspect of the left foot, no signs of infection  Psychiatric: She has a normal mood and affect. Her behavior is normal. Judgment and thought content normal.    ED Course  Procedures (including critical care time) Labs Reviewed - No data to display No results found. 1. Visit for wound check     MDM  Patient presents for wound check. The burn is healing very well. Continue Silvadene. Patient stable and ready for discharge.  Roxy Horseman, PA-C 05/26/13 2316

## 2013-05-26 NOTE — ED Notes (Signed)
Wound treated with silvadine cream, telfa gauze, and bulk gauze wrap. Pt instructed on wound care.

## 2013-05-26 NOTE — ED Provider Notes (Signed)
Medical screening examination/treatment/procedure(s) were performed by non-physician practitioner and as supervising physician I was immediately available for consultation/collaboration. Devoria Albe, MD, Armando Gang   Ward Givens, MD 05/26/13 [phone]

## 2013-05-28 ENCOUNTER — Ambulatory Visit (INDEPENDENT_AMBULATORY_CARE_PROVIDER_SITE_OTHER): Payer: Self-pay | Admitting: Family Medicine

## 2013-05-28 ENCOUNTER — Telehealth: Payer: Self-pay | Admitting: Family Medicine

## 2013-05-28 ENCOUNTER — Encounter: Payer: Self-pay | Admitting: Family Medicine

## 2013-05-28 VITALS — BP 142/85 | HR 79 | Wt 250.0 lb

## 2013-05-28 DIAGNOSIS — T25222D Burn of second degree of left foot, subsequent encounter: Secondary | ICD-10-CM

## 2013-05-28 DIAGNOSIS — T25222A Burn of second degree of left foot, initial encounter: Secondary | ICD-10-CM | POA: Insufficient documentation

## 2013-05-28 DIAGNOSIS — I10 Essential (primary) hypertension: Secondary | ICD-10-CM

## 2013-05-28 DIAGNOSIS — Z5189 Encounter for other specified aftercare: Secondary | ICD-10-CM

## 2013-05-28 MED ORDER — POST-OP SHOE/SOFT TOP WOMEN MISC: 1.0000 | Freq: Once | Status: DC

## 2013-05-28 MED ORDER — OXYCODONE-ACETAMINOPHEN 5-325 MG PO TABS: 1.0000 | ORAL_TABLET | Freq: Every evening | ORAL | Status: DC | PRN

## 2013-05-28 NOTE — Patient Instructions (Addendum)
I am sorry you burned your foot.  You should not go swimming until this is completely healed, this may take several weeks or even a few months.   Please continue to put the cream on twice a day.  Try elevating your foot as much as you can.  Try wearing the post-op shoe.   Your blood pressure today was BP: 142/85 mmHg.  Remember your goal blood pressure is about 130/80.  Please be sure to take your medication every day.

## 2013-05-28 NOTE — Assessment & Plan Note (Signed)
Slightly elevated, but patient in quite a bit of pain, will continue to monitor.

## 2013-05-28 NOTE — Telephone Encounter (Signed)
Pt given name of [facility] and phone number for post op shoe.  Magdalyn Arenivas, Darlyne Russian, CMA

## 2013-05-28 NOTE — Progress Notes (Signed)
  Subjective:    Patient ID: Jenna [name], female    DOB: 04/25/1961, 52 y.o.   MRN: [id number]  HPI:  Jenna [name] comes in for follow up of her 2nd degree burn of her left foot.  This happened Monday.  She spilled boiling water on her foot when she was cooking.  She went to the ER and is putting silvadene cream on twice a day.  She complains of pain and swelling, but no paresthesias or pain with movement of toes.  No foul odor. Wants to know if she can go swimming on July 19th and to [facility] on July 20th.   Her BP was noted to be elevated in the ER.  Taking lisinopril, denies chest pain, dyspnea, palpitations, headaches.    Past Medical History  Diagnosis Date  . Hypertension   . Thyroid disease   . Seizures     History  Substance Use Topics  . Smoking status: Former Smoker    Types: Cigarettes    Quit date: 06/13/2010  . Smokeless tobacco: Not on file  . Alcohol Use: No   ROS Pertinent items in HPI    Objective:  Physical Exam:  BP 142/85  Pulse 79  Wt 250 lb (113.399 kg)  BMI 40.37 kg/m2 General appearance: alert, cooperative and no distress Head: Normocephalic, without obvious abnormality, atraumatic Lungs: clear to auscultation bilaterally Heart: regular rate and rhythm, S1, S2 normal, no murmur, click, rub or gallop Pulses: 2+ and symmetric Extrem: On right thigh there are a few small blisters that have not peeled yet On left dorsal foot there is a 15 x 8 cm burn that had been debrieded.  Health pink color with granulation tissue present. No odor or drainage.  2+ pulses. There is edema but no pain with movement.       Assessment & Plan:

## 2013-05-28 NOTE — Assessment & Plan Note (Signed)
This does appear to be healing and does not appear to be infected.  Discussed at length that this will take weeks to months to heal completely, and that I do not expect her to be healed enough to go swimming in less than one month from now.  Advised she may be able to go to amusement park but she will likely be in pain.  Rx for more oxycodone, advised to try to wean off, only use at night time.  Advised ibuprofen and elevation.  Continue silvadene twice daily.  F/U in 2 weeks.

## 2013-05-28 NOTE — Telephone Encounter (Signed)
Patient is calling because [facility] does not carry the Post-Op shoe and she needs to know where she can get this. JW

## 2013-06-11 ENCOUNTER — Ambulatory Visit (INDEPENDENT_AMBULATORY_CARE_PROVIDER_SITE_OTHER): Payer: Self-pay | Admitting: Family Medicine

## 2013-06-11 ENCOUNTER — Encounter: Payer: Self-pay | Admitting: Family Medicine

## 2013-06-11 VITALS — BP 165/96 | HR 71 | Temp 98.3°F | Wt 248.0 lb

## 2013-06-11 DIAGNOSIS — T25222D Burn of second degree of left foot, subsequent encounter: Secondary | ICD-10-CM

## 2013-06-11 DIAGNOSIS — Z5189 Encounter for other specified aftercare: Secondary | ICD-10-CM

## 2013-06-11 DIAGNOSIS — I10 Essential (primary) hypertension: Secondary | ICD-10-CM

## 2013-06-11 MED ORDER — OXYCODONE-ACETAMINOPHEN 5-325 MG PO TABS: 1.0000 | ORAL_TABLET | Freq: Every evening | ORAL | Status: DC | PRN

## 2013-06-11 MED ORDER — AMLODIPINE BESYLATE 5 MG PO TABS: 5.0000 mg | ORAL_TABLET | Freq: Every day | ORAL | Status: DC

## 2013-06-11 NOTE — Assessment & Plan Note (Signed)
Improved. Pain improving as well. Refilled Percocet #10 to continue to wean off. She can come out of post-op shoe if not walking long distances. Getting in pool is up to her, but I would advise not to if there happens to be an open sore. RTC in 4 weeks or sooner if anything changes.

## 2013-06-11 NOTE — Patient Instructions (Addendum)
It was nice to meet you today.   Continue to use the cream for now, but you can slowly cut back on that. Use lotion on the area to keep the skin soft.   You can wear the post-op shoe if walking a lot, like at [employer] to keep the area from getting an open sore.  You may swim at the pool but use your judgement. If you see any open sores or oozing, please do not get in the water.   Follow up with your primary doctor within the next month, or sooner if needed.  Aleida Crandell M. Mohini Heathcock, M.D.

## 2013-06-11 NOTE — Assessment & Plan Note (Signed)
Restart Norvasc and continue Lisinopril. RTC in 4 weeks.

## 2013-06-11 NOTE — Progress Notes (Signed)
Patient ID: Jenna [name], female   DOB: [birth year], 52 y.o.   MRN: [id number]  [name] Jenna [name] M. Jenna Valletta, MD Phone: [phone]   Subjective: HPI: Patient is a 52 y.o. female presenting to clinic today for follow up appointment for burn on foot. She has been using silvadene cream qhs and wearing post op shoe. Pain has improved, but still has pain with ambulation for long distances. No oozing or open sores. She states she is able to touch the area without sensitivity. Burn on right thigh has also improved and is scabbed over. Patient is concerned about a trip to a water park next weekend and would like to know if she can swim.  Also, BP elevated for last 3 office visits. States she is only on Lisinopril now, but old notes say to ADD Lisinopril to Norvasc. Patient overall feels well.  History Reviewed: Former smoker.  ROS: Please see HPI above.  Objective: Office vital signs reviewed. BP 165/96  Pulse 71  Temp(Src) 98.3 F (36.8 C) (Oral)  Wt 248 lb (112.492 kg)  BMI 40.05 kg/m2  Physical Examination:  General: Awake, alert. NAD HEENT: Atraumatic, normocephalic. MMM Extremities: Large area of peeling on right outer thigh without redness or oozing. Area across top of foot with hyperpigemtned edges. Exposed dermis is pink and dry. No open sores, no oozing. Nontender to palpation. 2+ pulses.  Neuro: Grossly intact  Assessment: 52 y.o. female follow up appointment  Plan: See Problem List and After Visit Summary

## 2013-08-23 ENCOUNTER — Other Ambulatory Visit: Payer: Self-pay | Admitting: Family Medicine

## 2013-10-20 ENCOUNTER — Emergency Department (HOSPITAL_COMMUNITY)
Admission: EM | Admit: 2013-10-20 | Discharge: 2013-10-20 | Disposition: A | Discharge status: Home / Self Care | Payer: Self-pay | Attending: Emergency Medicine | Admitting: Emergency Medicine

## 2013-10-20 ENCOUNTER — Encounter (HOSPITAL_COMMUNITY): Payer: Self-pay | Admitting: Emergency Medicine

## 2013-10-20 ENCOUNTER — Emergency Department (HOSPITAL_COMMUNITY): Payer: Self-pay

## 2013-10-20 DIAGNOSIS — I10 Essential (primary) hypertension: Secondary | ICD-10-CM | POA: Insufficient documentation

## 2013-10-20 DIAGNOSIS — S6990XA Unspecified injury of unspecified wrist, hand and finger(s), initial encounter: Secondary | ICD-10-CM | POA: Insufficient documentation

## 2013-10-20 DIAGNOSIS — S6991XA Unspecified injury of right wrist, hand and finger(s), initial encounter: Secondary | ICD-10-CM

## 2013-10-20 DIAGNOSIS — E079 Disorder of thyroid, unspecified: Secondary | ICD-10-CM | POA: Insufficient documentation

## 2013-10-20 DIAGNOSIS — Z79899 Other long term (current) drug therapy: Secondary | ICD-10-CM | POA: Insufficient documentation

## 2013-10-20 DIAGNOSIS — S59909A Unspecified injury of unspecified elbow, initial encounter: Secondary | ICD-10-CM | POA: Insufficient documentation

## 2013-10-20 DIAGNOSIS — Y9389 Activity, other specified: Secondary | ICD-10-CM | POA: Insufficient documentation

## 2013-10-20 DIAGNOSIS — Z8669 Personal history of other diseases of the nervous system and sense organs: Secondary | ICD-10-CM | POA: Insufficient documentation

## 2013-10-20 DIAGNOSIS — Z87891 Personal history of nicotine dependence: Secondary | ICD-10-CM | POA: Insufficient documentation

## 2013-10-20 DIAGNOSIS — X500XXA Overexertion from strenuous movement or load, initial encounter: Secondary | ICD-10-CM | POA: Insufficient documentation

## 2013-10-20 DIAGNOSIS — Y929 Unspecified place or not applicable: Secondary | ICD-10-CM | POA: Insufficient documentation

## 2013-10-20 MED ORDER — HYDROCODONE-ACETAMINOPHEN 5-325 MG PO TABS
2.0000 | ORAL_TABLET | Freq: Once | ORAL | Status: AC
  Administered 2013-10-20: 2 via ORAL
  Filled 2013-10-20: qty 2

## 2013-10-20 MED ORDER — HYDROCODONE-ACETAMINOPHEN 5-325 MG PO TABS: 2.0000 | ORAL_TABLET | ORAL | Status: DC | PRN

## 2013-10-20 NOTE — ED Notes (Signed)
States was moving boxes and heard a pop and now rt hand and wrist hurt

## 2013-10-20 NOTE — ED Provider Notes (Signed)
CSN: [id number]     Arrival date & time 10/20/13  1245 History  This chart was scribed for Emilia Beck, PA, working with Harrold Donath R. Rubin Payor, MD, by Ardelia Mems ED Scribe. This patient was seen in room TR09C/TR09C and the patient's care was started at 1:13 PM.   Chief Complaint  Patient presents with  . Hand Pain    Patient is a 52 y.o. female presenting with hand pain. The history is provided by the patient. No language interpreter was used.  Hand Pain This is a new problem. The current episode started 2 days ago. The problem occurs rarely. The problem has not changed since onset.The symptoms are aggravated by bending. Nothing relieves the symptoms. She has tried nothing for the symptoms.   HPI Comments: Jenna [name] is a 52 y.o. female who presents to the Emergency Department complaining of constant, moderate right hand pain onset 2 days ago after a day of moving furniture. She states that she hear a "pop" in her hand while moving furniture. She states that her pain is worsened palpation and with certain movements of her hand. She denies any other pain or symptoms. She states that she has no medication allergies. She states that she did not drive to the ED today.  PCP- Dr. Simone Curia    Past Medical History  Diagnosis Date  . Hypertension   . Thyroid disease   . Seizures    Past Surgical History  Procedure Laterality Date  . Total thyroidectomy  2010    primary hyperparathyroidism  . Parathyroidectomy  2010    primary hyperparpthyroidism  . Uterine ablation  2009    menorrhagia and severe anemia   No family history on file. History  Substance Use Topics  . Smoking status: Former Smoker    Types: Cigarettes    Quit date: 06/13/2010  . Smokeless tobacco: Not on file  . Alcohol Use: No   OB History   Grav Para Term Preterm Abortions TAB SAB Ect Mult Living                 Review of Systems  Musculoskeletal:       Right hand and wrist pain  All  other systems reviewed and are negative.   Allergies  Review of patient's allergies indicates no known allergies.  Home Medications   Current Outpatient Rx  Name  Route  Sig  Dispense  Refill  . amLODipine (NORVASC) 5 MG tablet   Oral   Take 5 mg by mouth daily.         Marland Kitchen levothyroxine (SYNTHROID, LEVOTHROID) 125 MCG tablet   Oral   Take 125 mcg by mouth daily. Needs MD appointment         . lisinopril (PRINIVIL,ZESTRIL) 5 MG tablet   Oral   Take 1 tablet (5 mg total) by mouth daily.   30 tablet   5    Triage Vitals: BP 149/99  Pulse 88  Temp(Src) 98.3 F (36.8 C) (Oral)  Resp 18  Wt 247 lb 9.6 oz (112.311 kg)  SpO2 95%  Physical Exam  Nursing note and vitals reviewed. Constitutional: She is oriented to person, place, and time. She appears well-developed and well-nourished. No distress.  HENT:  Head: Normocephalic and atraumatic.  Eyes: EOM are normal.  Neck: Neck supple. No tracheal deviation present.  Cardiovascular: Normal rate.   Pulmonary/Chest: Effort normal. No respiratory distress.  Musculoskeletal: Normal range of motion.  Swelling and tenderness to palpation noted over proximal  MCP of first digit on right hand. No obvious deformity. ROM of fingers limited due to pain. No snuff box tenderness.   Neurological: She is alert and oriented to person, place, and time.  Skin: Skin is warm and dry.  Psychiatric: She has a normal mood and affect. Her behavior is normal.    ED Course  Procedures (including critical care time)  DIAGNOSTIC STUDIES: Oxygen Saturation is 95% on RA, normal by my interpretation.    COORDINATION OF CARE: 1:17 PM- Awaiting results of radiology. Will order medication for pain. Pt advised of plan for treatment and pt agrees.  Medications  HYDROcodone-acetaminophen (NORCO/VICODIN) 5-325 MG per tablet 2 tablet (2 tablets Oral Given 10/20/13 1355)   Labs Review Labs Reviewed - No data to display Imaging Review Dg Hand Complete  Right  10/20/2013   CLINICAL DATA:  Pain  EXAM: RIGHT HAND - COMPLETE 3+ VIEW  COMPARISON:  03/12/2010.  FINDINGS: There is no evidence of fracture or dislocation. There is no evidence of arthropathy or other focal bone abnormality. Soft tissues are unremarkable.  IMPRESSION: Negative.   Electronically Signed   By: Salome Holmes M.D.   On: 10/20/2013 13:55    EKG Interpretation   None       MDM   1. Hand injury, right, initial encounter     2:12 PM Xray is unremarkable for acute changes. No neurovascular compromise. Patient instructed to rest and ice her injury. I will prescribe pain medication as well. No further evaluation needed at this time. No snuff box tenderness.    I personally performed the services described in this documentation, which was scribed in my presence. The recorded information has been reviewed and is accurate.    Emilia Beck, PA-C 10/20/13 [phone]

## 2013-10-25 NOTE — ED Provider Notes (Signed)
Medical screening examination/treatment/procedure(s) were performed by non-physician practitioner and as supervising physician I was immediately available for consultation/collaboration.  EKG Interpretation   None        Anjolaoluwa Siguenza R. Pamala Hayman, MD 10/25/13 1555 

## 2013-11-03 ENCOUNTER — Ambulatory Visit (INDEPENDENT_AMBULATORY_CARE_PROVIDER_SITE_OTHER): Payer: Self-pay | Admitting: Family Medicine

## 2013-11-03 ENCOUNTER — Encounter: Payer: Self-pay | Admitting: Family Medicine

## 2013-11-03 VITALS — BP 159/105 | HR 84 | Temp 98.0°F | Wt 247.0 lb

## 2013-11-03 DIAGNOSIS — L304 Erythema intertrigo: Secondary | ICD-10-CM

## 2013-11-03 DIAGNOSIS — L538 Other specified erythematous conditions: Secondary | ICD-10-CM

## 2013-11-03 DIAGNOSIS — Z8669 Personal history of other diseases of the nervous system and sense organs: Secondary | ICD-10-CM

## 2013-11-03 MED ORDER — FLUCONAZOLE 150 MG PO TABS: 150.0000 mg | ORAL_TABLET | ORAL | Status: DC

## 2013-11-03 NOTE — Patient Instructions (Signed)
Intertrigo Intertrigo is a skin condition that occurs in between folds of skin in places on the body that rub together a lot and do not get much ventilation. It is caused by heat, moisture, friction, sweat retention, and lack of air circulation, which produces red, irritated patches and, sometimes, scaling or drainage. People who have diabetes, who are obese, or who have treatment with antibiotics are at increased risk for intertrigo. The most common sites for intertrigo to occur include:  The groin.  The breasts.  The armpits.  Folds of abdominal skin.  Webbed spaces between the fingers or toes. Intertrigo may be aggravated by:  Sweat.  Feces.  Yeast or bacteria that are present near skin folds.  Urine.  Vaginal discharge. HOME CARE INSTRUCTIONS  The following steps can be taken to reduce friction and keep the affected area cool and dry:  Expose skin folds to the air.  Keep deep skin folds separated with cotton or linen cloth. Avoid tight fitting clothing that could cause chafing.  Wear open-toed shoes or sandals to help reduce moisture between the toes.  Apply absorbent powders to affected areas as directed by your caregiver.  Apply over-the-counter barrier pastes, such as zinc oxide, as directed by your caregiver.  If you develop a fungal infection in the affected area, your caregiver may have you use antifungal creams. SEEK MEDICAL CARE IF:   The rash is not improving after 1 week of treatment.  The rash is getting worse (more red, more swollen, more painful, or spreading).  You have a fever or chills. MAKE SURE YOU:   Understand these instructions.  Will watch your condition.  Will get help right away if you are not doing well or get worse. Document Released: 11/18/2005 Document Revised: 02/10/2012 Document Reviewed: 05/03/2010 [employer] Patient Information 2014 [employer].  

## 2013-11-03 NOTE — Assessment & Plan Note (Signed)
-   suspect now recently worsening with stress- some episodes of shaking without LOC or true tonic/clonic movement.  Full workup in the past per pt - recommend f/u with PCP and may need to consider referral through [facility] to neurology.  - pt agreed with this plan.

## 2013-11-03 NOTE — Assessment & Plan Note (Signed)
-   exam consistent fairly consistent with intertrigo - likely candidal - given extent and severity extending several CM out of the groin bilaterally, will treat systemically.  - for cost purposes, rx of fluconazole 150mg  weekly for 6 weeks - may try otc hydrocortisone for burning relief - f/u in 6 weeks to ensure improvement given extent of disease.

## 2013-11-03 NOTE — Progress Notes (Signed)
Patient ID: Jenna [name], female   DOB: 1961-05-20, 52 y.o.   MRN: [id number] FAMILY MEDICINE OFFICE NOTE  Chief Complaint:  rash  Primary Care Physician: Simone Curia, MD  HPI:  Jenna [name] is 52 yo who presents for a rash.   Has had a rash for the last several months - significant itching - feels like it is from the moisture - all under breasts and in thighs - itches and burns from time to time - has tried gold bond and benadryl cream. Didn't work Has tried vaseline also. Still didn't work   No fevers, chills, nausea, vomiting, abd pain, HA, vision changes.   Also has a hx of pseudoseizures and a hx of being on depakote. Wondering what she can do to get back on some sort of medication.  Pt is currently without insurance. Has episodes where she just shakes. She does not lose consciousness. These happen particularly when she is under high stress.      PMHx:  Past Medical History  Diagnosis Date  . Hypertension   . Thyroid disease   . Seizures     Past Surgical History  Procedure Laterality Date  . Total thyroidectomy  2010    primary hyperparathyroidism  . Parathyroidectomy  2010    primary hyperparpthyroidism  . Uterine ablation  2009    menorrhagia and severe anemia    FAMHx:  No family history on file.  SOCHx:   reports that she quit smoking about 3 years ago. Her smoking use included Cigarettes. She smoked 0.00 packs per day. She does not have any smokeless tobacco history on file. She reports that she does not drink alcohol or use illicit drugs.  ALLERGIES:  No Known Allergies  ROS: Pertinent ROS as seen in HPI. Otherwise negative.   HOME MEDS: Current Outpatient Prescriptions  Medication Sig Dispense Refill  . amLODipine (NORVASC) 5 MG tablet Take 5 mg by mouth daily.      . fluconazole (DIFLUCAN) 150 MG tablet Take 1 tablet (150 mg total) by mouth once a week.  3 tablet  1  . HYDROcodone-acetaminophen (NORCO/VICODIN) 5-325 MG per  tablet Take 2 tablets by mouth every 4 (four) hours as needed.  20 tablet  0  . levothyroxine (SYNTHROID, LEVOTHROID) 125 MCG tablet Take 125 mcg by mouth daily. Needs MD appointment      . lisinopril (PRINIVIL,ZESTRIL) 5 MG tablet Take 1 tablet (5 mg total) by mouth daily.  30 tablet  5   No current facility-administered medications for this visit.    LABS/IMAGING: No results found for this or any previous visit (from the past 48 hour(s)). No results found.  VITALS: BP 159/105  Pulse 84  Temp(Src) 98 F (36.7 C) (Oral)  Wt 247 lb (112.038 kg)  EXAM: Gen: NAD, well appearing HEENT: oropharynx clear, pupils equal and reactive SKIN: erythematous, hyperkeratotic plaques in bilateral breast folds and groin folds. Some raised borders with a few small satellite lesions.  Evidence of excoriation.    ASSESSMENT: Intertrigo  PLAN: See separate assessment and plan section.

## 2013-11-15 ENCOUNTER — Other Ambulatory Visit: Payer: Self-pay | Admitting: Family Medicine

## 2013-12-03 ENCOUNTER — Encounter (HOSPITAL_COMMUNITY): Payer: Self-pay | Admitting: Emergency Medicine

## 2013-12-03 ENCOUNTER — Emergency Department (HOSPITAL_COMMUNITY)
Admission: EM | Admit: 2013-12-03 | Discharge: 2013-12-03 | Disposition: A | Discharge status: Home / Self Care | Payer: Self-pay | Attending: Emergency Medicine | Admitting: Emergency Medicine

## 2013-12-03 ENCOUNTER — Emergency Department (HOSPITAL_COMMUNITY): Payer: Self-pay

## 2013-12-03 DIAGNOSIS — Z79899 Other long term (current) drug therapy: Secondary | ICD-10-CM | POA: Insufficient documentation

## 2013-12-03 DIAGNOSIS — Z8669 Personal history of other diseases of the nervous system and sense organs: Secondary | ICD-10-CM | POA: Insufficient documentation

## 2013-12-03 DIAGNOSIS — I10 Essential (primary) hypertension: Secondary | ICD-10-CM | POA: Insufficient documentation

## 2013-12-03 DIAGNOSIS — X500XXA Overexertion from strenuous movement or load, initial encounter: Secondary | ICD-10-CM | POA: Insufficient documentation

## 2013-12-03 DIAGNOSIS — E079 Disorder of thyroid, unspecified: Secondary | ICD-10-CM | POA: Insufficient documentation

## 2013-12-03 DIAGNOSIS — Z87891 Personal history of nicotine dependence: Secondary | ICD-10-CM | POA: Insufficient documentation

## 2013-12-03 DIAGNOSIS — Y929 Unspecified place or not applicable: Secondary | ICD-10-CM | POA: Insufficient documentation

## 2013-12-03 DIAGNOSIS — Y9389 Activity, other specified: Secondary | ICD-10-CM | POA: Insufficient documentation

## 2013-12-03 DIAGNOSIS — S93409A Sprain of unspecified ligament of unspecified ankle, initial encounter: Secondary | ICD-10-CM | POA: Insufficient documentation

## 2013-12-03 NOTE — ED Provider Notes (Signed)
CSN: [id number]     Arrival date & time 12/03/13  [id number] History  This chart was scribed for Jenna MuttonMarissa Malloree Raboin, PA-C, working with Gerhard Munchobert Lockwood, MD, by Ardelia Memsylan Malpass ED Scribe. This patient was seen in room TR08C/TR08C and the patient's care was started at 8:54 PM.   Chief Complaint  Patient presents with  . Foot Injury    The history is provided by the patient. No language interpreter was used.    HPI Comments: Jenna [name] is a 53 y.o. female who presents to the Emergency Department complaining of a right ankle injury that occurred about 1 week ago. She states that she stepped off of a curb, and landed on her ankle in an inverted manner, causing the onset of intermittent, moderate "aching" pain with associated swelling over the past week. She states that the pain begins in the top of her right foot and radiates to her ankle and lower leg. She states that she has elevated the foot, but has not applied ice or taking any medications. She states that she has a prior history of spraining her ankle. She denies any other pain or symptoms.  Past Medical History  Diagnosis Date  . Hypertension   . Thyroid disease   . Seizures    Past Surgical History  Procedure Laterality Date  . Total thyroidectomy  2010    primary hyperparathyroidism  . Parathyroidectomy  2010    primary hyperparpthyroidism  . Uterine ablation  2009    menorrhagia and severe anemia   No family history on file. History  Substance Use Topics  . Smoking status: Former Smoker    Types: Cigarettes    Quit date: 06/13/2010  . Smokeless tobacco: Not on file  . Alcohol Use: No   OB History   Grav Para Term Preterm Abortions TAB SAB Ect Mult Living                 Review of Systems  Musculoskeletal: Positive for arthralgias (right ankle) and joint swelling (right ankle).  All other systems reviewed and are negative.   Allergies  Review of patient's allergies indicates no known allergies.  Home Medications    Current Outpatient Rx  Name  Route  Sig  Dispense  Refill  . amLODipine (NORVASC) 5 MG tablet   Oral   Take 5 mg by mouth daily.         . fluconazole (DIFLUCAN) 150 MG tablet   Oral   Take 1 tablet (150 mg total) by mouth once a week.   3 tablet   1   . HYDROcodone-acetaminophen (NORCO/VICODIN) 5-325 MG per tablet   Oral   Take 2 tablets by mouth every 4 (four) hours as needed.   20 tablet   0   . levothyroxine (SYNTHROID, LEVOTHROID) 125 MCG tablet   Oral   Take 125 mcg by mouth daily. Needs MD appointment         . levothyroxine (SYNTHROID, LEVOTHROID) 125 MCG tablet   Oral   Take 1 tablet (125 mcg total) by mouth daily before breakfast. Needs MD appt for hypothyroidism.   30 tablet   1   . lisinopril (PRINIVIL,ZESTRIL) 5 MG tablet   Oral   Take 1 tablet (5 mg total) by mouth daily.   30 tablet   5    Triage Vitals: BP 143/83  Pulse 64  Temp(Src) 98.1 F (36.7 C)  Resp 18  Ht 5\' 5"  (1.651 m)  Wt 247 lb (112.038 kg)  BMI 41.10 kg/m2  Physical Exam  Nursing note and vitals reviewed. Constitutional: She is oriented to person, place, and time. She appears well-developed and well-nourished. No distress.  HENT:  Head: Normocephalic and atraumatic.  Neck: Normal range of motion. Neck supple.  Cardiovascular:  Pulses:      Radial pulses are 2+ on the right side, and 2+ on the left side.       Dorsalis pedis pulses are 2+ on the right side, and 2+ on the left side.  Musculoskeletal: Normal range of motion. She exhibits tenderness.       Feet:   Negative swelling, erythema, inflammation, lesions, sores noted to the right ankle when compared to the left ankle. Discomfort upon palpation to the lateral aspect of the right ankle-localize the ATFL of the right ankle. Decreased range of motion secondary to pain-most discomfort with inversion and eversion of the right foot. Patient is able to flex and extend digits of the right foot. Patient is able to point her  toes.  Neurological: She is alert and oriented to person, place, and time. She exhibits normal muscle tone. Coordination normal.  Strength 5+/5+ to lower extremities bilaterally with resistance applied, equal distribution noted Strength intact to digits of the right foot Sensation intact with differentiation to sharp and dull touch   Skin: Skin is warm and dry. No rash noted. She is not diaphoretic. No erythema.  Psychiatric: She has a normal mood and affect. Her behavior is normal. Thought content normal.    ED Course  Procedures (including critical care time)  DIAGNOSTIC STUDIES: Oxygen Saturation is 98% on room air, normal by my interpretation.    COORDINATION OF CARE: 8:59 PM- Discussed negative radiology findings and clinical suspicion that pt sprained her ankle. Pt advised of plan for treatment and pt agrees.  Dg Ankle Complete Right  12/03/2013   CLINICAL DATA:  Rolled right foot 1 week ago, right ankle pain  EXAM: RIGHT ANKLE - COMPLETE 3+ VIEW  COMPARISON:  None.  FINDINGS: There is no evidence of fracture, dislocation, or joint effusion. There is no evidence of arthropathy or other focal bone abnormality. Soft tissues are unremarkable.  IMPRESSION: Negative.   Electronically Signed   By: Esperanza Heir M.D.   On: 12/03/2013 20:01   Dg Foot Complete Right  12/03/2013   CLINICAL DATA:  Injured right foot.  EXAM: RIGHT FOOT COMPLETE - 3+ VIEW  COMPARISON:  None.  FINDINGS: The joint spaces are maintained.  No acute fracture.  IMPRESSION: No acute bony findings.   Electronically Signed   By: Loralie Champagne M.D.   On: 12/03/2013 20:08    Labs Review Labs Reviewed - No data to display Imaging Review Dg Ankle Complete Right  12/03/2013   CLINICAL DATA:  Rolled right foot 1 week ago, right ankle pain  EXAM: RIGHT ANKLE - COMPLETE 3+ VIEW  COMPARISON:  None.  FINDINGS: There is no evidence of fracture, dislocation, or joint effusion. There is no evidence of arthropathy or other focal  bone abnormality. Soft tissues are unremarkable.  IMPRESSION: Negative.   Electronically Signed   By: Esperanza Heir M.D.   On: 12/03/2013 20:01   Dg Foot Complete Right  12/03/2013   CLINICAL DATA:  Injured right foot.  EXAM: RIGHT FOOT COMPLETE - 3+ VIEW  COMPARISON:  None.  FINDINGS: The joint spaces are maintained.  No acute fracture.  IMPRESSION: No acute bony findings.   Electronically Signed   By: Luan Pulling.D.  On: 12/03/2013 20:08    EKG Interpretation   None       MDM   1. Ankle sprain and strain, right, initial encounter     Filed Vitals:   12/03/13 1859 12/03/13 2208  BP: 143/83 138/80  Pulse: 64 66  Temp: 98.1 F (36.7 C) 98 F (36.7 C)  TempSrc:  Oral  Resp: 18 18  Height: 5\' 5"  (1.651 m)   Weight: 247 lb (112.038 kg)   SpO2:  98%   I personally performed the services described in this documentation, which was scribed in my presence. The recorded information has been reviewed and is accurate.  Patient presenting to emergency department with right ankle pain after twisting at approximately week ago. Alert and oriented. GCS 15. Pulses palpable, DP 2+. Negative swelling, erythema, deformities noted to the right ankle. Pain upon palpation to lateral aspect-ATFL of the right ankle. Patient is able to point her toes without difficulty. Sensation intact with differentiation to sharp and dull touch. Strength intact with resistance applied, equal distribution noted. Sensation intact. Plain films of right foot and right ankle negative for acute fracture or dislocation. Patient neurovascularly intact. Suspicion to be ankle sprain secondary to twisting her ankle. Patient placed in ankle brace and crutches administered for comfort. Discussed with patient to rest, ice, elevate. Discussed with patient to avoid any physical or shortness activity. Referred patient to orthopedics. Discussed with patient to closely monitor symptoms and if symptoms are to worsen or change to  report back to the ED - strict return instructions given.  Patient agreed to plan of care, understood, all questions answered.    Jenna Mutton, PA-C 12/04/13 0330

## 2013-12-03 NOTE — ED Notes (Signed)
The pt is c/o  Rt foot anf ankle pain for one week after she thinks she twisted it

## 2013-12-03 NOTE — Discharge Instructions (Signed)
Please call your doctor for a followup appointment within 24-48 hours. When you talk to your doctor please let them know that you were seen in the emergency department and have them acquire all of your records so that they can discuss the findings with you and formulate a treatment plan to fully care for your new and ongoing problems. Please call and set up appointment with Dr. Ophelia Charter - orthopedic regarding right ankle pain Please keep brace on right ankle at all times Please use crutches Please take Ibuprofen as needed for pain relief - please take on a full stomach  When resting can remove the brace-please apply ice and elevate with pillows Please keep brace on when sleeping when active Please avoid any physical or strenuous activity Please continue to monitor symptoms and if symptoms are to worsen or change (fever greater than 101, chills, nausea, numbness, tingling, worsening pain, swelling, redness, streaking, fall, injury) is report back to emergency apartment immediately    Ankle Sprain An ankle sprain is an injury to the strong, fibrous tissues (ligaments) that hold the bones of your ankle joint together.  CAUSES An ankle sprain is usually caused by a fall or by twisting your ankle. Ankle sprains most commonly occur when you step on the outer edge of your foot, and your ankle turns inward. People who participate in sports are more prone to these types of injuries.  SYMPTOMS   Pain in your ankle. The pain may be present at rest or only when you are trying to stand or walk.  Swelling.  Bruising. Bruising may develop immediately or within 1 to 2 days after your injury.  Difficulty standing or walking, particularly when turning corners or changing directions. DIAGNOSIS  Your caregiver will ask you details about your injury and perform a physical exam of your ankle to determine if you have an ankle sprain. During the physical exam, your caregiver will press on and apply pressure to  specific areas of your foot and ankle. Your caregiver will try to move your ankle in certain ways. An X-ray exam may be done to be sure a bone was not broken or a ligament did not separate from one of the bones in your ankle (avulsion fracture).  TREATMENT  Certain types of braces can help stabilize your ankle. Your caregiver can make a recommendation for this. Your caregiver may recommend the use of medicine for pain. If your sprain is severe, your caregiver may refer you to a surgeon who helps to restore function to parts of your skeletal system (orthopedist) or a physical therapist. HOME CARE INSTRUCTIONS   Apply ice to your injury for 1 2 days or as directed by your caregiver. Applying ice helps to reduce inflammation and pain.  Put ice in a plastic bag.  Place a towel between your skin and the bag.  Leave the ice on for 15-20 minutes at a time, every 2 hours while you are awake.  Only take over-the-counter or prescription medicines for pain, discomfort, or fever as directed by your caregiver.  Elevate your injured ankle above the level of your heart as much as possible for 2 3 days.  If your caregiver recommends crutches, use them as instructed. Gradually put weight on the affected ankle. Continue to use crutches or a cane until you can walk without feeling pain in your ankle.  If you have a plaster splint, wear the splint as directed by your caregiver. Do not rest it on anything harder than a pillow  for the first 24 hours. Do not put weight on it. Do not get it wet. You may take it off to take a shower or bath.  You may have been given an elastic bandage to wear around your ankle to provide support. If the elastic bandage is too tight (you have numbness or tingling in your foot or your foot becomes cold and blue), adjust the bandage to make it comfortable.  If you have an air splint, you may blow more air into it or let air out to make it more comfortable. You may take your splint off at  night and before taking a shower or bath. Wiggle your toes in the splint several times per day to decrease swelling. SEEK MEDICAL CARE IF:   You have rapidly increasing bruising or swelling.  Your toes feel extremely cold or you lose feeling in your foot.  Your pain is not relieved with medicine. SEEK IMMEDIATE MEDICAL CARE IF:  Your toes are numb or blue.  You have severe pain that is increasing. MAKE SURE YOU:   Understand these instructions.  Will watch your condition.  Will get help right away if you are not doing well or get worse. Document Released: 11/18/2005 Document Revised: 08/12/2012 Document Reviewed: 11/30/2011 [facility] Patient Information 2014 [place], [place].   Emergency Department Resource Guide 1) Find a Doctor and Pay Out of Pocket Although you won't have to find out who is covered by your insurance plan, it is a good idea to ask around and get recommendations. You will then need to call the office and see if the doctor you have chosen will accept you as a new patient and what types of options they offer for patients who are self-pay. Some doctors offer discounts or will set up payment plans for their patients who do not have insurance, but you will need to ask so you aren't surprised when you get to your appointment.  2) Contact Your Local Health Department Not all health departments have doctors that can see patients for sick visits, but many do, so it is worth a call to see if yours does. If you don't know where your local health department is, you can check in your phone book. The CDC also has a tool to help you locate your state's health department, and many state websites also have listings of all of their local health departments.  3) Find a Walk-in Clinic If your illness is not likely to be very severe or complicated, you may want to try a walk in clinic. These are popping up all over the country in pharmacies, drugstores, and shopping centers. They're  usually staffed by nurse practitioners or physician assistants that have been trained to treat common illnesses and complaints. They're usually fairly quick and inexpensive. However, if you have serious medical issues or chronic medical problems, these are probably not your best option.  No Primary Care Doctor: - Call Health Connect at  [phone] - they can help you locate a primary care doctor that  accepts your insurance, provides certain services, etc. - Physician Referral Service- [phone]  Chronic Pain Problems: Organization         Address  Phone   Notes  [facility]  [phone] Patients need to be referred by their primary care doctor.   Medication Assistance: Organization         Address  Phone   Notes  [facility] [phone]  [place], [place] [address] [phone] --Must be a resident of [facility] -- Must have NO insurance coverage whatsoever (no Medicaid/ Medicare, etc.) -- The pt. MUST have a primary care doctor that directs their care regularly and follows them in the community   [facility]ist  [phone]   [employer]  [phone]    Agencies that provide inexpensive medical care: Organization         Address  Phone   Notes  [facility]  [phone]   [facility]    [phone]   [facility] [address] [place] [address] [place], [place] [address] [phone]   [facility] [address] [place], [place] [phone]   Planned Parenthood    [phone]   [facility]    [phone]   [facility]  [address], [place] Phone:  [phone], Fax:  [phone] Hours of Operation:  9 am - 6 pm, M-F.  Also accepts Medicaid/Medicare and self-pay.  [facility]  [address], [place] Phone: [phone], Fax: [phone]. Hours  of Operation:  8:30 am - 5:30 pm, M-F.  Also accepts Medicaid and self-pay.  [facility] [place] [address] [place] Phone: [phone]   [facility] [address] Natasha Bence [place], [place] [phone], Ext. [phone] Mondays & Thursdays: 7-9 AM.  First 15 patients are seen on a first come, first serve basis.    Medicaid-accepting [facility] Providers:  Organization         Address  Phone   Notes  [facility] [address], [place] [phone] Also accepts self-pay patients.  [facility] [address] Laurell Josephs [place], [place]  [phone]   [facility] [address], [place] [phone]   [facility] [address], [place] [phone]   Renaye Rakers [address], [place]   [phone] Only accepts [place] Access [place] patients after they have their name applied to their card.   Self-Pay (no insurance) in [facility]:  Organization         Address  Phone   Notes  Sickle Cell Patients, [facility] [address] [place], [place] [phone]   [facility] [address] [place], [place] [phone]   [facility] [place]  [address], [place] [phone]   [facility]/Dr. Osei-Bonsu  [address], [place] or [address], [place] [phone] Phone number for both [place] and [place] locations is the same.  Urgent Medical and [facility] [address] [place] [phone]   [facility] [address] [place] or [address] [phone]   [facility] [address] [place] [phone], phone; [phone], fax Sees patients 1st and 3rd Saturday of every month.  Must not qualify for public or private insurance (i.e. Medicaid,  Medicare, [place] Health Choice, Veterans' Benefits)  Household income should be no more than 200% of the poverty level The clinic cannot treat you if you are pregnant or think you are pregnant  Sexually transmitted diseases are not treated at the clinic.    Dental Care: Organization  Address  Phone  Notes  [facility] [place], [place] [phone] Accepts children up to age 56 who are enrolled in [place] or [place]; pregnant women with a Medicaid card; and children who have applied for Medicaid or [place] Health Choice, but were declined, whose parents can pay a reduced fee at time of service.  [facility]  [address], [place] [phone] Accepts children up to age 72 who are enrolled in [place] or [place]; pregnant women with a Medicaid card; and children who have applied for Medicaid or [facility] Health Choice, but were declined, whose parents can pay a reduced fee at time of service.  [facility] Access PROGRAM  [place] [phone] Patients are seen by appointment only. Walk-ins are not accepted. [place] will see patients 83 years of age and older. Monday - Tuesday (8am-5pm) Most Wednesdays (8:30-5pm) $30 per visit, cash only  [facility] Adult Dental Access PROGRAM  [address], [facility] [phone] Patients are seen by appointment only. Walk-ins are not accepted. [place] will see patients 21 years of age and older. One Wednesday Evening (Monthly: Volunteer Based).  $30 per visit, cash only  [place]  [phone] for adults; Children under age 5, call Graduate Pediatric Dentistry at [phone]. Children aged 7-14, please call [phone] to request a pediatric application.  Dental services are provided in all areas of dental care including fillings, crowns  and bridges, complete and partial dentures, implants, gum treatment, root canals, and extractions. Preventive care is also provided. Treatment is provided to both adults and children. Patients are selected via a lottery and there is often a waiting list.   [facility] [address] [place]  [phone] [email or web address]   [facility] [address] [place], [place] [phone], Ext. [phone] Second and Fourth Thursday of each month, opens at 6:30 AM; Clinic ends at 9 AM.  Patients are seen on a first-come first-served basis, and a limited number are seen during each clinic.   [facility]  [address] Hillard Danker [place], [place] [phone]   Eligibility Requirements You must have lived in [place], [place], or [place] counties for at least the last three months.   You cannot be eligible for state or federal sponsored [employer], including [employer], [place], or [employer].   You generally cannot be eligible for healthcare insurance through your employer.    How to apply: Eligibility screenings are held every Tuesday and Wednesday afternoon from 1:00 pm until 4:00 pm. You do not need an appointment for the interview!  [facility] [address], [place], [place]   [place]  [place] Department  [place]  [phone]    Behavioral Health Resources in the Community: Intensive Outpatient Programs Organization         Address  Phone  Notes  Rafael Hernandez [place]. [address] [place], [place] [phone]   [facility] [address] [place], [place]   [facility] [address] [place], [place]   [place] [address],  [place], [place] or [phone]   Substance Abuse  Resources  Organization         Address  Phone  Notes  Alcohol and Drug Services  [place]  [phone]   [facility] [place]  [phone]   Chinita Pester  [phone]   Residential & Outpatient Substance Abuse Program  [phone]   Psychological Services Organization         Address  Phone  Notes  [facility] [place]  [place]  [phone]   [place] [address], [place] or [phone]    Mobile Crisis Teams Organization         Address  Phone  Notes  [facility], Mobile Crisis Care Unit  [phone]   [facility]  [address] [place], [place]   [place] [address], [place] [phone]    Self-Help/Support Groups Organization         Address  Phone             Notes  [place]. of [place] - variety of support groups  [place] Call for more information  Narcotics Anonymous (NA), [facility] [address], [employer] [place]  2 meetings at this location   Special educational needs teacher         Address  Phone  Notes  [facility] [place],    [place]  [phone]   [facility]  [address] [place] [address], [place], [place]   [place], [place] [phone] Admissions: 8am-3pm M-F  Incentives Substance [place] [address],    [place], [place] [phone]   [facility] [address] [place], [place], [place]   [facility] [address],  [place], [place]   [facility] - Intensive Outpatient [address] Kristeen [address], [place], [place]   [facility] [address].,  [place], [place] [phone] or [phone]   [facility] ([facility]) [address], [place], [place] Accepts Medicaid  [facility] [place] [address],  [place] [phone] Substance Abuse/Addiction Treatment   [facility] Organization         Address  Phone  Notes  [facility]  [phone]   Domenic Schwab, PhD [address] Arlis Porta [place], [place]   [phone] or [phone]   [facility], [place] [phone]   [facility] [address] [place], [place] [phone] Insurance/Medicaid/sponsorship through [facility] and Families [address] [place]                                    [place], [place] [phone] [place] [address], [place] [phone]    Dr. Adele Schilder  [phone]   [facility] Dept. 1) [address] [place] 2) [place] 3)  [place] [address], [place] [phone]  [phone]   [place] [phone] or [phone] (After Hours)

## 2013-12-05 NOTE — ED Provider Notes (Signed)
  Medical screening examination/treatment/procedure(s) were performed by non-physician practitioner and as supervising physician I was immediately available for consultation/collaboration.  EKG Interpretation   None          Chelsi Warr, MD 12/05/13 0022 

## 2014-02-20 ENCOUNTER — Other Ambulatory Visit: Payer: Self-pay | Admitting: Family Medicine

## 2014-05-13 ENCOUNTER — Ambulatory Visit: Payer: Self-pay | Admitting: Family Medicine

## 2014-05-16 ENCOUNTER — Ambulatory Visit (INDEPENDENT_AMBULATORY_CARE_PROVIDER_SITE_OTHER): Payer: Self-pay | Admitting: Family Medicine

## 2014-05-16 ENCOUNTER — Encounter: Payer: Self-pay | Admitting: Family Medicine

## 2014-05-16 VITALS — BP 132/85 | HR 77 | Temp 98.2°F | Ht 65.0 in | Wt 251.1 lb

## 2014-05-16 DIAGNOSIS — M542 Cervicalgia: Secondary | ICD-10-CM

## 2014-05-16 DIAGNOSIS — R569 Unspecified convulsions: Secondary | ICD-10-CM

## 2014-05-16 DIAGNOSIS — E890 Postprocedural hypothyroidism: Secondary | ICD-10-CM

## 2014-05-16 LAB — COMPREHENSIVE METABOLIC PANEL
ALBUMIN: 4.4 g/dL (ref 3.5–5.2)
ALK PHOS: 70 U/L (ref 39–117)
ALT: 15 U/L (ref 0–35)
AST: 14 U/L (ref 0–37)
BILIRUBIN TOTAL: 0.4 mg/dL (ref 0.2–1.2)
BUN: 10 mg/dL (ref 6–23)
CO2: 30 mEq/L (ref 19–32)
Calcium: 9.8 mg/dL (ref 8.4–10.5)
Chloride: 104 mEq/L (ref 96–112)
Creat: 1.2 mg/dL — ABNORMAL HIGH (ref 0.50–1.10)
GLUCOSE: 132 mg/dL — AB (ref 70–99)
POTASSIUM: 3.9 meq/L (ref 3.5–5.3)
SODIUM: 143 meq/L (ref 135–145)
TOTAL PROTEIN: 7 g/dL (ref 6.0–8.3)

## 2014-05-16 LAB — CBC
HCT: 42.8 % (ref 36.0–46.0)
Hemoglobin: 14.7 g/dL (ref 12.0–15.0)
MCH: 26.6 pg (ref 26.0–34.0)
MCHC: 34.3 g/dL (ref 30.0–36.0)
MCV: 77.4 fL — AB (ref 78.0–100.0)
PLATELETS: 232 10*3/uL (ref 150–400)
RBC: 5.53 MIL/uL — ABNORMAL HIGH (ref 3.87–5.11)
RDW: 15.6 % — AB (ref 11.5–15.5)
WBC: 8.4 10*3/uL (ref 4.0–10.5)

## 2014-05-16 MED ORDER — KETOROLAC TROMETHAMINE 60 MG/2ML IM SOLN
60.0000 mg | Freq: Once | INTRAMUSCULAR | Status: AC
  Administered 2014-05-16: 60 mg via INTRAMUSCULAR

## 2014-05-16 NOTE — Patient Instructions (Signed)
Mrs and Mr Jenna [name],  Thank you for coming in today. It sounds like you may have had seizure activity. It is hard to know without a witness.   Getting blood work today. Record any recurrent symptoms to help guide Dr. Karie Schwalbe on evaluation and treatment options.  Regarding R sided neck pain. Exam consistent with MSK pain. Shot or Toradol today.  I will be in touch with lab results.   Schedule PCP f/u in 2-4 weeks.   Dr. Armen PickupFunches

## 2014-05-17 ENCOUNTER — Telehealth: Payer: Self-pay | Admitting: Family Medicine

## 2014-05-17 DIAGNOSIS — M542 Cervicalgia: Secondary | ICD-10-CM | POA: Insufficient documentation

## 2014-05-17 LAB — TSH: TSH: 25.717 u[IU]/mL — AB (ref 0.350–4.500)

## 2014-05-17 MED ORDER — CYCLOBENZAPRINE HCL 5 MG PO TABS: 5.0000 mg | ORAL_TABLET | Freq: Three times a day (TID) | ORAL | Status: DC | PRN

## 2014-05-17 NOTE — Assessment & Plan Note (Signed)
A: MSK pain. No signs of TMJ or ear pathology.  P: toradol injection today.

## 2014-05-17 NOTE — Assessment & Plan Note (Addendum)
A: reported compliance with synthroid. Then she admitted she takes it takes twice daily now.  Lab Results  Component Value Date   TSH 25.717* 05/16/2014   P:  Will start taking it  125 mcg daily everyday.  PCP f/u for TSH recheck in 6-8 weeks.

## 2014-05-17 NOTE — Progress Notes (Signed)
   Subjective:    Patient ID: Jenna [name], female    DOB: [birth year], 53 y.o.   MRN: [id number] CC: ? Seizure, R sided neck pain  HPI 53 yo F presents with her husband and grandson for f/u visit:  1. ? Seizure:  Hx of seizures/shaking spells (pseudoseizure) . She woke up in the morning of 05/10/14 with generalized soreness and tongue pain concerning for overnight seizure activity. She has had no witnessed seizure activity. She reports last seizure was 11/26/14. She denies fever, sleep deprivation, no sickness, substance abuse, medication changes. She and her husband endorse stress at home. Husband recently returned to work after a prolonged illness, pregnant daughter living at home.   2. R sided neck pain: x one month. No known injury. No jaw pain or ear pain. No fever. Described at a soreness. Slightly improved with oral NSAID.   Soc hx: non smoker  Review of Systems As per HPI     Objective:   Physical Exam BP 132/85  Pulse 77  Temp(Src) 98.2 F (36.8 C) (Oral)  Ht 5\' 5"  (1.651 m)  Wt 251 lb 1.6 oz (113.898 kg)  BMI 41.79 kg/m2 General appearance: alert, cooperative and no distress Head: Normocephalic, without obvious abnormality, atraumatic Eyes: conjunctivae/corneas clear. PERRL, EOM's intact.  Ears: normal TM's and external ear canals both ears Throat: lips, mucosa, and tongue normal; teeth and gums normal Neck: Full ROM, TTP R side sternocleidomastoid  Neurologic: Alert and oriented X 3, normal strength and tone. Normal symmetric reflexes. Normal coordination and gait    Assessment & Plan:

## 2014-05-17 NOTE — Telephone Encounter (Signed)
Patient called Labs reviewed. She admitted to taking synthroid twice weekly only. Agrees with daily dosing with f/u in 6-8 weeks with PCP.   She reports persistent R sided neck pain, slight improvement.  Sent in flexeril 5 mg TID prn.

## 2014-05-17 NOTE — Assessment & Plan Note (Signed)
A: ? Seizure activity. No identifiable triggers P; Blood work including TSH obtained, reviewed, relatively normal except for elevated TSH.  Plan for PCP f/u. Recommend no driving.  Recommend patient monitor for recurrence and f/u with PCP if increased activity.

## 2014-05-23 ENCOUNTER — Encounter (HOSPITAL_COMMUNITY): Payer: Self-pay | Admitting: Emergency Medicine

## 2014-05-23 ENCOUNTER — Emergency Department (HOSPITAL_COMMUNITY)
Admission: EM | Admit: 2014-05-23 | Discharge: 2014-05-23 | Disposition: A | Discharge status: Home / Self Care | Payer: Self-pay | Attending: Emergency Medicine | Admitting: Emergency Medicine

## 2014-05-23 ENCOUNTER — Other Ambulatory Visit: Payer: Self-pay

## 2014-05-23 ENCOUNTER — Emergency Department (HOSPITAL_COMMUNITY): Payer: Self-pay

## 2014-05-23 DIAGNOSIS — Y929 Unspecified place or not applicable: Secondary | ICD-10-CM | POA: Insufficient documentation

## 2014-05-23 DIAGNOSIS — R202 Paresthesia of skin: Secondary | ICD-10-CM

## 2014-05-23 DIAGNOSIS — Z79899 Other long term (current) drug therapy: Secondary | ICD-10-CM | POA: Insufficient documentation

## 2014-05-23 DIAGNOSIS — I10 Essential (primary) hypertension: Secondary | ICD-10-CM | POA: Insufficient documentation

## 2014-05-23 DIAGNOSIS — S139XXA Sprain of joints and ligaments of unspecified parts of neck, initial encounter: Secondary | ICD-10-CM | POA: Insufficient documentation

## 2014-05-23 DIAGNOSIS — E079 Disorder of thyroid, unspecified: Secondary | ICD-10-CM | POA: Insufficient documentation

## 2014-05-23 DIAGNOSIS — Z87891 Personal history of nicotine dependence: Secondary | ICD-10-CM | POA: Insufficient documentation

## 2014-05-23 DIAGNOSIS — Z8669 Personal history of other diseases of the nervous system and sense organs: Secondary | ICD-10-CM | POA: Insufficient documentation

## 2014-05-23 DIAGNOSIS — Y9389 Activity, other specified: Secondary | ICD-10-CM | POA: Insufficient documentation

## 2014-05-23 DIAGNOSIS — S161XXA Strain of muscle, fascia and tendon at neck level, initial encounter: Secondary | ICD-10-CM

## 2014-05-23 DIAGNOSIS — X58XXXA Exposure to other specified factors, initial encounter: Secondary | ICD-10-CM | POA: Insufficient documentation

## 2014-05-23 DIAGNOSIS — R209 Unspecified disturbances of skin sensation: Secondary | ICD-10-CM | POA: Insufficient documentation

## 2014-05-23 LAB — CBG MONITORING, ED: Glucose-Capillary: 202 mg/dL — ABNORMAL HIGH (ref 70–99)

## 2014-05-23 MED ORDER — OXYCODONE-ACETAMINOPHEN 5-325 MG PO TABS
1.0000 | ORAL_TABLET | Freq: Once | ORAL | Status: AC
  Administered 2014-05-23: 1 via ORAL
  Filled 2014-05-23: qty 1

## 2014-05-23 MED ORDER — HYDROCODONE-ACETAMINOPHEN 5-325 MG PO TABS: 1.0000 | ORAL_TABLET | Freq: Four times a day (QID) | ORAL | Status: DC | PRN

## 2014-05-23 MED ORDER — CYCLOBENZAPRINE HCL 10 MG PO TABS: 10.0000 mg | ORAL_TABLET | Freq: Two times a day (BID) | ORAL | Status: DC | PRN

## 2014-05-23 NOTE — ED Notes (Addendum)
Patient reports her husband was recently in the hospital 1 month ago being treated for bacterial meningitis.

## 2014-05-23 NOTE — ED Notes (Signed)
Dr. Docherty at the bedside.  

## 2014-05-23 NOTE — ED Notes (Signed)
She woke this morning with numbness and tingling to R side of neck and R arm. The family is concerned because her doctor told them they should come have her checked for a stroke. She is A&Ox4, grips equal bilaterally. She states it hurts too bad to lift her arm up.

## 2014-05-23 NOTE — ED Notes (Signed)
Patient reports neck pains started last week.

## 2014-05-23 NOTE — Discharge Instructions (Signed)
Cervical Sprain A cervical sprain is when the tissues (ligaments) that hold the neck bones in place stretch or tear. HOME CARE   Put ice on the injured area.  Put ice in a plastic bag.  Place a towel between your skin and the bag.  Leave the ice on for 15-20 minutes, 3-4 times a day.  You may have been given a collar to wear. This collar keeps your neck from moving while you heal.  Do not take the collar off unless told by your doctor.  If you have long hair, keep it outside of the collar.  Ask your doctor before changing the position of your collar. You may need to change its position over time to make it more comfortable.  If you are allowed to take off the collar for cleaning or bathing, follow your doctor's instructions on how to do it safely.  Keep your collar clean by wiping it with mild soap and water. Dry it completely. If the collar has removable pads, remove them every 1-2 days to hand wash them with soap and water. Allow them to air dry. They should be dry before you wear them in the collar.  Do not drive while wearing the collar.  Only take medicine as told by your doctor.  Keep all doctor visits as told.  Keep all physical therapy visits as told.  Adjust your work station so that you have good posture while you work.  Avoid positions and activities that make your problems worse.  Warm up and stretch before being active. GET HELP IF:  Your pain is not controlled with medicine.  You cannot take less pain medicine over time as planned.  Your activity level does not improve as expected. GET HELP RIGHT AWAY IF:   You are bleeding.  Your stomach is upset.  You have an allergic reaction to your medicine.  You develop new problems that you cannot explain.  You lose feeling (become numb) or you cannot move any part of your body (paralysis).  You have tingling or weakness in any part of your body.  Your symptoms get worse. Symptoms include:  Pain,  soreness, stiffness, puffiness (swelling), or a burning feeling in your neck.  Pain when your neck is touched.  Shoulder or upper back pain.  Limited ability to move your neck.  Headache.  Dizziness.  Your hands or arms feel week, lose feeling, or tingle.  Muscle spasms.  Difficulty swallowing or chewing. MAKE SURE YOU:   Understand these instructions.  Will watch your condition.  Will get help right away if you are not doing well or get worse. Document Released: 05/06/2008 Document Revised: 07/21/2013 Document Reviewed: 05/26/2013 ExitCare Patient Information 2015 ExitCare, LLC. This information is not intended to replace advice given to you by your health care provider. Make sure you discuss any questions you have with your health care provider.  

## 2014-05-23 NOTE — ED Notes (Signed)
202

## 2014-05-24 NOTE — ED Provider Notes (Signed)
CSN: [id number]     Arrival date & time 05/23/14  1452 History   First MD Initiated Contact with Patient 05/23/14 1936     Chief Complaint  Patient presents with  . Neck Injury     (Consider location/radiation/quality/duration/timing/severity/associated sxs/prior Treatment) Patient is a 53 y.o. female presenting with neck injury. The history is provided by the patient. No language interpreter was used.  Neck Injury This is a new problem. The current episode started more than 2 days ago (1 week). The problem occurs constantly. The problem has not changed since onset.Pertinent negatives include no chest pain, no abdominal pain, no headaches and no shortness of breath. Associated symptoms comments: Intermittent, brief paresthesias in BL hands and L foot . Exacerbated by: turing head. Nothing relieves the symptoms. She has tried nothing for the symptoms. The treatment provided no relief.    Past Medical History  Diagnosis Date  . Hypertension   . Thyroid disease   . Seizures    Past Surgical History  Procedure Laterality Date  . Total thyroidectomy  2010    primary hyperparathyroidism  . Parathyroidectomy  2010    primary hyperparpthyroidism  . Uterine ablation  2009    menorrhagia and severe anemia   History reviewed. No pertinent family history. History  Substance Use Topics  . Smoking status: Former Smoker    Types: Cigarettes    Quit date: 06/13/2010  . Smokeless tobacco: Not on file  . Alcohol Use: No   OB History   Grav Para Term Preterm Abortions TAB SAB Ect Mult Living                 Review of Systems  Constitutional: Negative for fever, chills, diaphoresis, activity change, appetite change and fatigue.  HENT: Negative for congestion, facial swelling, rhinorrhea and sore throat.   Eyes: Negative for photophobia and discharge.  Respiratory: Negative for cough, chest tightness and shortness of breath.   Cardiovascular: Negative for chest pain, palpitations and leg  swelling.  Gastrointestinal: Negative for nausea, vomiting, abdominal pain and diarrhea.  Endocrine: Negative for polydipsia and polyuria.  Genitourinary: Negative for dysuria, frequency, difficulty urinating and pelvic pain.  Musculoskeletal: Negative for arthralgias, back pain, neck pain and neck stiffness.  Skin: Negative for color change and wound.  Allergic/Immunologic: Negative for immunocompromised state.  Neurological: Negative for facial asymmetry, weakness, numbness and headaches.  Hematological: Does not bruise/bleed easily.  Psychiatric/Behavioral: Negative for confusion and agitation.      Allergies  Review of patient's allergies indicates no known allergies.  Home Medications   Prior to Admission medications   Medication Sig Start Date End Date Taking? Authorizing Trendon Zaring  amLODipine (NORVASC) 5 MG tablet Take 5 mg by mouth daily. 06/11/13  Yes Amber Nydia BoutonM Hairford, MD  levothyroxine (SYNTHROID, LEVOTHROID) 125 MCG tablet Take 125 mcg by mouth daily. Needs MD appointment 08/23/13  Yes Leona SingletonMaria T Thekkekandam, MD  lisinopril (PRINIVIL,ZESTRIL) 5 MG tablet Take 1 tablet (5 mg total) by mouth daily. 05/14/13  Yes Shelva MajesticStephen O Hunter, MD  naproxen sodium (ANAPROX) 220 MG tablet Take 440 mg by mouth daily as needed (pain).   Yes Historical July Nickson, MD  cyclobenzaprine (FLEXERIL) 10 MG tablet Take 1 tablet (10 mg total) by mouth 2 (two) times daily as needed for muscle spasms. 05/23/14   Shanna CiscoMegan E Docherty, MD  cyclobenzaprine (FLEXERIL) 5 MG tablet Take 1 tablet (5 mg total) by mouth 3 (three) times daily as needed for muscle spasms. 05/17/14   Josalyn C Funches,  MD  HYDROcodone-acetaminophen (NORCO) 5-325 MG per tablet Take 1 tablet by mouth every 6 (six) hours as needed. 05/23/14   Shanna CiscoMegan E Docherty, MD   BP 184/98  Pulse 63  Temp(Src) 98.6 F (37 C) (Oral)  Resp 18  SpO2 99% Physical Exam  Constitutional: She is oriented to person, place, and time. She appears well-developed and  well-nourished. No distress.  HENT:  Head: Normocephalic and atraumatic.  Mouth/Throat: No oropharyngeal exudate.  Eyes: Pupils are equal, round, and reactive to light.  Neck: Normal range of motion. Neck supple.    Cardiovascular: Normal rate, regular rhythm and normal heart sounds.  Exam reveals no gallop and no friction rub.   No murmur heard. Pulmonary/Chest: Effort normal and breath sounds normal. No respiratory distress. She has no wheezes. She has no rales.  Abdominal: Soft. Bowel sounds are normal. She exhibits no distension and no mass. There is no tenderness. There is no rebound and no guarding.  Musculoskeletal: Normal range of motion. She exhibits no edema.       Right shoulder: She exhibits tenderness. She exhibits no bony tenderness.       Arms: Neurological: She is alert and oriented to person, place, and time. She has normal strength. She displays no atrophy and no tremor. No cranial nerve deficit or sensory deficit. She exhibits normal muscle tone. She displays a negative Romberg sign. Coordination and gait normal. GCS eye subscore is 4. GCS verbal subscore is 5. GCS motor subscore is 6.  Skin: Skin is warm and dry.  Psychiatric: She has a normal mood and affect.    ED Course  Procedures (including critical care time) Labs Review Labs Reviewed  CBG MONITORING, ED - Abnormal; Notable for the following:    Glucose-Capillary 202 (*)    All other components within normal limits    Imaging Review Ct Head Wo Contrast  05/23/2014   CLINICAL DATA:  Numbness and tingling in the right side of the neck and right arm.  EXAM: CT HEAD WITHOUT CONTRAST  CT CERVICAL SPINE WITHOUT CONTRAST  TECHNIQUE: Multidetector CT imaging of the head and cervical spine was performed following the standard protocol without intravenous contrast. Multiplanar CT image reconstructions of the cervical spine were also generated.  COMPARISON:  Report of CT scan of the head dated 09/08/2003  FINDINGS: CT  HEAD FINDINGS  No mass lesion. No midline shift. No acute hemorrhage or hematoma. No extra-axial fluid collections. No evidence of acute infarction. Brain parenchyma is normal. No osseous abnormality.  CT CERVICAL SPINE FINDINGS  There is no fracture, disc space narrowing, subluxation, foraminal stenosis, or prevertebral soft tissue swelling.  IMPRESSION: 1. Normal CT scan of the head. 2. No significant abnormality of the cervical spine.   Electronically Signed   By: Geanie CooleyJim  Maxwell M.D.   On: 05/23/2014 21:46   Ct Cervical Spine Wo Contrast  05/23/2014   CLINICAL DATA:  Numbness and tingling in the right side of the neck and right arm.  EXAM: CT HEAD WITHOUT CONTRAST  CT CERVICAL SPINE WITHOUT CONTRAST  TECHNIQUE: Multidetector CT imaging of the head and cervical spine was performed following the standard protocol without intravenous contrast. Multiplanar CT image reconstructions of the cervical spine were also generated.  COMPARISON:  Report of CT scan of the head dated 09/08/2003  FINDINGS: CT HEAD FINDINGS  No mass lesion. No midline shift. No acute hemorrhage or hematoma. No extra-axial fluid collections. No evidence of acute infarction. Brain parenchyma is normal. No osseous abnormality.  CT CERVICAL SPINE FINDINGS  There is no fracture, disc space narrowing, subluxation, foraminal stenosis, or prevertebral soft tissue swelling.  IMPRESSION: 1. Normal CT scan of the head. 2. No significant abnormality of the cervical spine.   Electronically Signed   By: Geanie Cooley M.D.   On: 05/23/2014 21:46     EKG Interpretation None      MDM   Final diagnoses:  Cervical strain, acute, initial encounter  Paresthesias    Pt is a 53 y.o. female with Pmhx as above who presents with R lateral neck pain for about 1 week after having a seizure in her bed the day prior to pain starting. She reported having intermittent tingling of BL hands and L foot. On PE, VSS, pt in NAD. +ttp over R lateral paraspinal muscles &  R trapezius.  No focal neuro findings. Gait nml. CT head & c-spine nml. HPI and PE not c/w CVA or TIA.  Suspect muscle strain.  Will d/c home w/ short course of norco/flexeril.  Return precautions given for new or worsening symptoms including worsening pain, numbness, weakness, fever.         Shanna Cisco, MD 05/24/14 2130

## 2014-05-30 ENCOUNTER — Other Ambulatory Visit: Payer: Self-pay | Admitting: Family Medicine

## 2014-07-18 ENCOUNTER — Other Ambulatory Visit: Payer: Self-pay | Admitting: Family Medicine

## 2014-07-18 DIAGNOSIS — I10 Essential (primary) hypertension: Secondary | ICD-10-CM

## 2014-07-18 NOTE — Telephone Encounter (Signed)
Pt called and needs a refill on her Lisinopril. jw  °

## 2014-07-19 MED ORDER — LISINOPRIL 5 MG PO TABS: 5.0000 mg | ORAL_TABLET | Freq: Every day | ORAL | Status: DC

## 2014-07-19 NOTE — Telephone Encounter (Signed)
Refilled lisinopril. Please let Ms Jenna [name] know we are well overdue for high BP follow up and checking labs.  Jenna SingletonMaria T Minnie Legros, MD

## 2014-07-19 NOTE — Addendum Note (Signed)
Addended by: Simone CuriaHEKKEKANDAM, Kynlea Blackston T on: 07/19/2014 01:39 PM   Modules accepted: Orders

## 2014-07-19 NOTE — Telephone Encounter (Signed)
Pt informed.  Appt made for 08/16/14 and same day appt made for 07/26/14 for leg and stomach pain. Jenna [name], Jenna RochesterJessica [name]

## 2014-07-26 ENCOUNTER — Ambulatory Visit (INDEPENDENT_AMBULATORY_CARE_PROVIDER_SITE_OTHER): Payer: Self-pay | Admitting: Family Medicine

## 2014-07-26 ENCOUNTER — Other Ambulatory Visit (HOSPITAL_COMMUNITY)
Admission: RE | Admit: 2014-07-26 | Discharge: 2014-07-26 | Disposition: A | Discharge status: Home / Self Care | Payer: Self-pay | Source: Ambulatory Visit | Attending: Family Medicine | Admitting: Family Medicine

## 2014-07-26 ENCOUNTER — Encounter: Payer: Self-pay | Admitting: Family Medicine

## 2014-07-26 VITALS — BP 148/92 | HR 77 | Temp 98.1°F | Ht 65.0 in | Wt 247.3 lb

## 2014-07-26 DIAGNOSIS — R1032 Left lower quadrant pain: Secondary | ICD-10-CM

## 2014-07-26 DIAGNOSIS — Z113 Encounter for screening for infections with a predominantly sexual mode of transmission: Secondary | ICD-10-CM | POA: Insufficient documentation

## 2014-07-26 DIAGNOSIS — Z1151 Encounter for screening for human papillomavirus (HPV): Secondary | ICD-10-CM | POA: Insufficient documentation

## 2014-07-26 DIAGNOSIS — Z124 Encounter for screening for malignant neoplasm of cervix: Secondary | ICD-10-CM | POA: Insufficient documentation

## 2014-07-26 DIAGNOSIS — R109 Unspecified abdominal pain: Secondary | ICD-10-CM

## 2014-07-26 DIAGNOSIS — N644 Mastodynia: Secondary | ICD-10-CM

## 2014-07-26 DIAGNOSIS — E210 Primary hyperparathyroidism: Secondary | ICD-10-CM

## 2014-07-26 LAB — POCT URINALYSIS DIPSTICK
Bilirubin, UA: NEGATIVE
Blood, UA: NEGATIVE
GLUCOSE UA: NEGATIVE
KETONES UA: NEGATIVE
LEUKOCYTES UA: NEGATIVE
Nitrite, UA: NEGATIVE
Protein, UA: 30
Urobilinogen, UA: 0.2
pH, UA: 6

## 2014-07-26 LAB — POCT WET PREP (WET MOUNT): Clue Cells Wet Prep Whiff POC: NEGATIVE

## 2014-07-26 LAB — POCT UA - MICROSCOPIC ONLY

## 2014-07-26 LAB — POCT URINE PREGNANCY: Preg Test, Ur: NEGATIVE
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
## 2014-07-27 LAB — TSH: TSH: 13.533 u[IU]/mL — ABNORMAL HIGH (ref 0.350–4.500)

## 2014-07-27 LAB — CYTOLOGY - PAP

## 2014-07-27 MED ORDER — FLUCONAZOLE 150 MG PO TABS: 150.0000 mg | ORAL_TABLET | Freq: Once | ORAL | Status: DC

## 2014-07-28 DIAGNOSIS — N644 Mastodynia: Secondary | ICD-10-CM | POA: Insufficient documentation

## 2014-07-28 DIAGNOSIS — R1032 Left lower quadrant pain: Secondary | ICD-10-CM | POA: Insufficient documentation
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
## 2014-07-28 NOTE — Assessment & Plan Note (Signed)
DDx includes UTI, ovarian pain though lack of severity or systemic symptoms makes torsion unlikely at this time, ovarian cyst, pregnancy, GI issue. - With reported bloody stools, recommended colnoscopy and provided information to schedule appt. - With diarrhea, will check TSH>>13.5 from 25.7, improved with increasing days she takes her sinthroid from 2-3 days/week to 6 days/week. Asked to increase to daily and will recheck TSH in 6w. - Will also check wet prep, gc/chlamydia, upreg, UA>>pos for yeast; gc/chlamydia, upreg, and UA negative. Sent in diflucan. - return precautions reviewed.

## 2014-08-16 ENCOUNTER — Telehealth: Payer: Self-pay | Admitting: Family Medicine

## 2014-08-16 ENCOUNTER — Encounter: Payer: Self-pay | Admitting: Family Medicine

## 2014-08-16 ENCOUNTER — Ambulatory Visit (INDEPENDENT_AMBULATORY_CARE_PROVIDER_SITE_OTHER): Payer: Self-pay | Admitting: Family Medicine

## 2014-08-16 VITALS — BP 145/93 | HR 71 | Temp 98.4°F | Wt 246.0 lb

## 2014-08-16 DIAGNOSIS — M25569 Pain in unspecified knee: Secondary | ICD-10-CM

## 2014-08-16 DIAGNOSIS — M542 Cervicalgia: Secondary | ICD-10-CM

## 2014-08-16 DIAGNOSIS — M25562 Pain in left knee: Secondary | ICD-10-CM

## 2014-08-16 DIAGNOSIS — M25561 Pain in right knee: Secondary | ICD-10-CM | POA: Insufficient documentation

## 2014-08-16 MED ORDER — CYCLOBENZAPRINE HCL 5 MG PO TABS: 5.0000 mg | ORAL_TABLET | Freq: Three times a day (TID) | ORAL | Status: DC | PRN

## 2014-08-16 MED ORDER — NAPROXEN 500 MG PO TABS: 500.0000 mg | ORAL_TABLET | Freq: Two times a day (BID) | ORAL | Status: DC

## 2014-08-16 NOTE — Telephone Encounter (Signed)
Please call Ms Bolding and let her know without insurance PT will be very expensive. Ask her to pursue [employer] with Britta Mccreedy, as I think PT would be very helpful for her pain. In the meantime, she should continue neck exercises and work on quad strengthening exercises that I can show her if she does not know what to do.  Leona Singleton, MD
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
## 2014-08-16 NOTE — Patient Instructions (Addendum)
For your knee, get an xray of the left knee today. I have ordered PT to strengthen muscles around your knee. Take naproxen  twice daily with food for 2 weeks, then decrease to how you were taking it before with the over-the-counter  dose. You can also use ice and heat therapy and massage. If you have stomach discomfort or bleeding, stop med and let me know.  For your neck pain, work on stretches and massage and heat therapy. Take flexeril for muscle spasms three times daily as needed for 5 days, but be aware that it may make you drowsy.  Follow up with me in 1 month to follow up on both of these.  Best,  Leona Singleton, MD
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
## 2014-08-17 NOTE — Telephone Encounter (Signed)
Pt informed and agreeable. Fleeger, Jessica Dawn  

## 2014-08-31 ENCOUNTER — Other Ambulatory Visit: Payer: Self-pay | Admitting: *Deleted

## 2014-08-31 ENCOUNTER — Other Ambulatory Visit: Payer: Self-pay | Admitting: Family Medicine

## 2014-09-01 MED ORDER — AMLODIPINE BESYLATE 5 MG PO TABS: 5.0000 mg | ORAL_TABLET | Freq: Every day | ORAL | Status: DC

## 2014-09-19 ENCOUNTER — Other Ambulatory Visit: Payer: Self-pay | Admitting: Family Medicine

## 2014-11-14 ENCOUNTER — Encounter: Payer: Self-pay | Admitting: Family Medicine

## 2014-11-14 ENCOUNTER — Ambulatory Visit (INDEPENDENT_AMBULATORY_CARE_PROVIDER_SITE_OTHER): Payer: Self-pay | Admitting: Family Medicine

## 2014-11-14 VITALS — BP 154/92 | HR 67 | Temp 98.1°F | Wt 242.0 lb

## 2014-11-14 DIAGNOSIS — M25512 Pain in left shoulder: Secondary | ICD-10-CM

## 2014-11-14 MED ORDER — HYDROCODONE-ACETAMINOPHEN 5-325 MG PO TABS: 1.0000 | ORAL_TABLET | Freq: Two times a day (BID) | ORAL | Status: DC | PRN
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
## 2014-11-14 NOTE — Patient Instructions (Signed)
I am ordering a shoulder xray and a referral to sports medicine. I think you have an issue with the muscles around your shoulder but want to also check the Xray for arthritis. If you don't get a call from sports medicine in 1 week, call us. This will likely require physical therapy for a long time. In the meantime, avoid movements aggravating to your shoulder and any heavy lifting. Be sure to get a mammogram.  Best,  Leona SingletonMaria T Christropher Gintz, MD

## 2014-11-14 NOTE — Assessment & Plan Note (Addendum)
Acute worsening of chronic pain, likely arthritis vs rotator cuff tear of left shoulder given exam findings of weakness with shoulder abduction, pain with abduction past 45 degrees, tenderness, and positive empty can sign and liftoff. Unlikely related to breast given no skin changes or masses on exam. - Left shoulder xray ordered, though has no insurance so may not get until she does. Ultimately needs MRI but start with xray. - Instructed her to contact Abundio MiuBarbara McGregor to discuss [employer] - Referral placed to Sports Medicine; Seen by them 2010; would benefit from repeat MSK US. - norco # 20 rx'ed for severe pain BID prn; otherwise discussed tylenol, heat, rest and that this is not good for chronic pain. - No lifting above 45 deg abduction, no heavy lifting; otherwise stay active. - Precepted with Dr Lum BabeEniola. - Mammogram phone number provided.

## 2014-11-26 ENCOUNTER — Other Ambulatory Visit: Payer: Self-pay | Admitting: Family Medicine

## 2014-12-03 ENCOUNTER — Emergency Department (HOSPITAL_COMMUNITY)
Admission: EM | Admit: 2014-12-03 | Discharge: 2014-12-04 | Disposition: A | Discharge status: Home / Self Care | Payer: Self-pay | Attending: Emergency Medicine | Admitting: Emergency Medicine

## 2014-12-03 ENCOUNTER — Emergency Department (HOSPITAL_COMMUNITY): Payer: Self-pay

## 2014-12-03 ENCOUNTER — Encounter (HOSPITAL_COMMUNITY): Payer: Self-pay | Admitting: Emergency Medicine

## 2014-12-03 DIAGNOSIS — I10 Essential (primary) hypertension: Secondary | ICD-10-CM | POA: Insufficient documentation

## 2014-12-03 DIAGNOSIS — M545 Low back pain, unspecified: Secondary | ICD-10-CM

## 2014-12-03 DIAGNOSIS — E079 Disorder of thyroid, unspecified: Secondary | ICD-10-CM | POA: Insufficient documentation

## 2014-12-03 DIAGNOSIS — Z79899 Other long term (current) drug therapy: Secondary | ICD-10-CM | POA: Insufficient documentation

## 2014-12-03 DIAGNOSIS — M5386 Other specified dorsopathies, lumbar region: Secondary | ICD-10-CM | POA: Insufficient documentation

## 2014-12-03 DIAGNOSIS — M439 Deforming dorsopathy, unspecified: Secondary | ICD-10-CM

## 2014-12-03 DIAGNOSIS — Z87891 Personal history of nicotine dependence: Secondary | ICD-10-CM | POA: Insufficient documentation

## 2014-12-03 MED ORDER — DIAZEPAM 5 MG PO TABS
5.0000 mg | ORAL_TABLET | Freq: Once | ORAL | Status: AC
  Administered 2014-12-03: 5 mg via ORAL
  Filled 2014-12-03: qty 1

## 2014-12-03 MED ORDER — KETOROLAC TROMETHAMINE 30 MG/ML IJ SOLN
30.0000 mg | Freq: Once | INTRAMUSCULAR | Status: AC
  Administered 2014-12-03: 30 mg via INTRAMUSCULAR
  Filled 2014-12-03: qty 1

## 2014-12-03 NOTE — ED Notes (Signed)
Pt reports having a seizure one week ago and has had increased pain since seizure in lower back.  Pt alert and oriented.  Pt states she was taken off depakote for seizures.  Pt is currently not taking any medication for seizures.

## 2014-12-03 NOTE — ED Notes (Signed)
Pt. reports low back pain onset 2 days ago , denies fall or injury , no urinary discomfort , pt. stated seizure 2 weeks ago unsure if she injured her back during seizure . Ambulatory. Hypertensive at triage .

## 2014-12-04 MED ORDER — OXYCODONE-ACETAMINOPHEN 5-325 MG PO TABS: 1.0000 | ORAL_TABLET | Freq: Four times a day (QID) | ORAL | Status: DC | PRN

## 2014-12-04 MED ORDER — OXYCODONE-ACETAMINOPHEN 5-325 MG PO TABS
1.0000 | ORAL_TABLET | Freq: Once | ORAL | Status: AC
  Administered 2014-12-04: 1 via ORAL
  Filled 2014-12-04: qty 1

## 2014-12-04 NOTE — ED Provider Notes (Signed)
CSN: [id number]     Arrival date & time 12/03/14  2008 History   First MD Initiated Contact with Patient 12/03/14 2211     Chief Complaint  Patient presents with  . Back Pain     (Consider location/radiation/quality/duration/timing/severity/associated sxs/prior Treatment) Patient is a 54 y.o. female presenting with back pain. The history is provided by the patient and the spouse.  Back Pain Location:  Lumbar spine Quality:  Aching Radiates to:  Does not radiate Pain severity:  Moderate Pain is:  Worse during the day Onset quality:  Gradual Duration:  1 week Timing:  Constant Progression:  Worsening Chronicity:  New Context comment:  Patinet had a seizure while seated one week ago. Did not fall out of chair, go to the ground, or strike her back in any way but ever since then has been having aching back pain. Patient states she does have history of lower back pain Relieved by:  Nothing Worsened by:  Twisting Ineffective treatments:  None tried Associated symptoms: no abdominal pain, no bladder incontinence, no bowel incontinence, no chest pain, no dysuria, no fever, no headaches, no leg pain, no paresthesias, no pelvic pain, no perianal numbness, no weakness and no weight loss   Risk factors: no hx of cancer     Past Medical History  Diagnosis Date  . Hypertension   . Thyroid disease   . Seizures    Past Surgical History  Procedure Laterality Date  . Total thyroidectomy  2010    primary hyperparathyroidism  . Parathyroidectomy  2010    primary hyperparpthyroidism  . Uterine ablation  2009    menorrhagia and severe anemia   No family history on file. History  Substance Use Topics  . Smoking status: Former Smoker    Types: Cigarettes    Quit date: 06/13/2010  . Smokeless tobacco: Not on file  . Alcohol Use: No   OB History    No data available     Review of Systems  Constitutional: Negative for fever, chills, weight loss, diaphoresis, activity change, appetite  change and fatigue.  HENT: Negative for facial swelling, rhinorrhea, sore throat, trouble swallowing and voice change.   Eyes: Negative for photophobia, pain and visual disturbance.  Respiratory: Negative for cough, shortness of breath, wheezing and stridor.   Cardiovascular: Negative for chest pain, palpitations and leg swelling.  Gastrointestinal: Negative for nausea, vomiting, abdominal pain, constipation, anal bleeding and bowel incontinence.  Endocrine: Negative.   Genitourinary: Negative for bladder incontinence, dysuria, vaginal bleeding, vaginal discharge, vaginal pain and pelvic pain.  Musculoskeletal: Positive for back pain. Negative for myalgias and arthralgias.  Skin: Negative.  Negative for rash.  Allergic/Immunologic: Negative.   Neurological: Negative for dizziness, tremors, syncope, weakness, headaches and paresthesias.  Psychiatric/Behavioral: Negative for suicidal ideas, sleep disturbance and self-injury.  All other systems reviewed and are negative.     Allergies  Review of patient's allergies indicates no known allergies.  Home Medications   Prior to Admission medications   Medication Sig Start Date End Date Taking? Authorizing Provider  amLODipine (NORVASC) 5 MG tablet Take 1 tablet (5 mg total) by mouth daily. 09/01/14  Yes Leona Singleton, MD  levothyroxine (SYNTHROID, LEVOTHROID) 125 MCG tablet TAKE ONE TABLET BY MOUTH ONCE DAILY IN THE MORNING BEFORE BREAKFAST 08/31/14  Yes Leona Singleton, MD  lisinopril (PRINIVIL,ZESTRIL) 5 MG tablet Take 1 tablet (5 mg total) by mouth daily. Needs hypertension follow up 11/28/14  Yes Leona Singleton, MD  cyclobenzaprine (FLEXERIL) 5  MG tablet Take 1 tablet (5 mg total) by mouth 3 (three) times daily as needed for muscle spasms. Patient not taking: Reported on 12/03/2014 08/16/14   Leona Singleton, MD  fluconazole (DIFLUCAN) 150 MG tablet Take 1 tablet (150 mg total) by mouth once. Take 2nd tablet if symptoms  not improved the next day. Patient not taking: Reported on 12/03/2014 07/27/14   Leona Singleton, MD  HYDROcodone-acetaminophen [facility]) 5-325 MG per tablet Take 1 tablet by mouth 2 (two) times daily as needed. Patient not taking: Reported on 12/03/2014 11/14/14   Leona Singleton, MD  levothyroxine (SYNTHROID, LEVOTHROID) 125 MCG tablet Take 125 mcg by mouth daily. Needs MD appointment 08/23/13   Leona Singleton, MD  naproxen (NAPROSYN) 500 MG tablet Take 1 tablet (500 mg total) by mouth 2 (two) times daily with a meal. Patient not taking: Reported on 12/03/2014 08/16/14   Leona Singleton, MD  oxyCODONE-acetaminophen (PERCOCET/ROXICET) 5-325 MG per tablet Take 1 tablet by mouth every 6 (six) hours as needed for moderate pain or severe pain. 12/04/14   Lula Olszewski, MD   BP 151/82 mmHg  Pulse 107  Temp(Src) 98.4 F (36.9 C) (Oral)  Resp 12  Ht 5\' 6"  (1.676 m)  Wt 240 lb (108.863 kg)  BMI 38.76 kg/m2  SpO2 95% Physical Exam  Constitutional: She is oriented to person, place, and time. She appears well-developed and well-nourished. No distress.  HENT:  Head: Normocephalic and atraumatic.  Right Ear: External ear normal.  Left Ear: External ear normal.  Mouth/Throat: Oropharynx is clear and moist. No oropharyngeal exudate.  Eyes: Conjunctivae and EOM are normal. Pupils are equal, round, and reactive to light. No scleral icterus.  Neck: Normal range of motion. Neck supple. No JVD present. No tracheal deviation present. No thyromegaly present.  Cardiovascular: Normal rate, regular rhythm and intact distal pulses.  Exam reveals no gallop and no friction rub.   No murmur heard. Pulmonary/Chest: Effort normal and breath sounds normal. No respiratory distress. She has no wheezes. She has no rales.  Abdominal: Soft. Bowel sounds are normal. She exhibits no distension. There is no tenderness.  Musculoskeletal: Normal range of motion. She exhibits tenderness (bilateral paraspinal and midline  lumbar back pain). She exhibits no edema.  Neurological: She is alert and oriented to person, place, and time. No cranial nerve deficit. She exhibits normal muscle tone. Coordination normal. GCS eye subscore is 4. GCS verbal subscore is 5. GCS motor subscore is 6.  5/5 strength in all 4 extremities. Normal Gait. No saddle anesthesia. Sensation intact and equal in all 4 extremities.   Skin: Skin is warm and dry. She is not diaphoretic. No pallor.  Psychiatric: She has a normal mood and affect. She expresses no homicidal and no suicidal ideation. She expresses no suicidal plans and no homicidal plans.  Nursing note and vitals reviewed.   ED Course  Procedures (including critical care time) Labs Review Labs Reviewed - No data to display  Imaging Review Dg Lumbar Spine 2-3 Views  12/03/2014   CLINICAL DATA:  Seizure on December 26th. Sharp lower back pain since then.  EXAM: LUMBAR SPINE - 2-3 VIEW  COMPARISON:  CT abdomen and pelvis 05/16/2010  FINDINGS: Slight anterior wedge deformity of the superior endplate of L1 with suggestion of focal cortical irregularity may represent acute compression fracture. Approximately 10% loss of height anteriorly. No evidence of retropulsed fracture fragments. Appearance is new since previous CT study. Mild hypertrophic degenerative changes in the endplates.  IMPRESSION: Slight depression of the anterior superior endplate of L1 with cortical irregularity suggesting acute compression.   Electronically Signed   By: Burman Nieves M.D.   On: 12/03/2014 23:05   Ct Lumbar Spine Wo Contrast  12/04/2014   CLINICAL DATA:  Acute onset of lower back pain, status post seizure 1 week ago. Initial encounter.  EXAM: CT LUMBAR SPINE WITHOUT CONTRAST  TECHNIQUE: Multidetector CT imaging of the lumbar spine was performed without intravenous contrast administration. Multiplanar CT image reconstructions were also generated.  COMPARISON:  Lumbar spine radiographs performed earlier today  at 10:18 p.m., and CT of the abdomen and pelvis performed 05/16/2010  FINDINGS: There is suggestion of slight cortical irregularity in association with the mild compression deformity of the superior endplate of L1, which could reflect an acute or subacute fracture. There is mild chronic compression deformity involving the superior endplate of T12. There is no evidence of retropulsion. Visualized intervertebral disc spaces are preserved.  No significant disc protrusions are identified. There is no evidence of impression on exiting nerve roots, though the nerve roots are difficult to fully assess on CT. Minimal scattered vascular calcification is seen. The soft tissues are grossly unremarkable in appearance. The paraspinal musculature is intact.  IMPRESSION: 1. Suggestion of slight cortical irregularity in association with the mild compression deformity of the superior endplate of L1, which could reflect an acute or subacute fracture. No evidence of retropulsion. 2. Mild chronic compression deformity involving the superior endplate of T12.   Electronically Signed   By: Roanna Raider M.D.   On: 12/04/2014 00:31     EKG Interpretation None      MDM   Final diagnoses:  Low back pain  Compression deformity of vertebra    The patient is a 54 y.o. F who presents with 1 week of lower back pain. No redflag symptoms suggesting infection or spinal cord involvement. Low risk mechanism of having a shaking episode while sitting down. No neurological deficits on physical exam. Plain film ordered due to some midline tenderness and read as "Slight depression of the anterior superior endplate of L1 with cortical irregularity suggesting acute compression." Follow up lumbar CT ordered which shows "Suggestion of slight cortical irregularity in association with the mild compression deformity of the superior endplate of L1, which could reflect an acute or subacute fracture. No evidence of retropulsion."  Patient is  reevaluated, pain is well controlled and exam continues to be benign. Given low risk mechanism, patient denying any other recent trauma to explain back pain, and patient denying any other neurological symptoms have very low suspicion for acute fracture. Do not suspect any cord compression given imaging and exam. Will have patient follow up with Neurosurgery in clinic for outpatient management. Patient given strict rest precautions including avoiding strenuous activity, heavy lifting, or any back twisting until cleared by Neurosurgery in clinic. Patient expresses understanding and agreement with this plan and is discharged home.  Patient seen with attending, Dr. Jodi Mourning, who oversaw clinical decision making.     Lula Olszewski, MD 12/04/14 [id number]  Enid Skeens, MD 12/05/14 [phone]

## 2014-12-14 ENCOUNTER — Ambulatory Visit (INDEPENDENT_AMBULATORY_CARE_PROVIDER_SITE_OTHER): Payer: Self-pay | Admitting: Family Medicine

## 2014-12-14 ENCOUNTER — Encounter: Payer: Self-pay | Admitting: Family Medicine

## 2014-12-14 VITALS — BP 146/106 | HR 80 | Temp 98.3°F | Ht 66.0 in | Wt 239.0 lb

## 2014-12-14 DIAGNOSIS — R569 Unspecified convulsions: Secondary | ICD-10-CM

## 2014-12-14 DIAGNOSIS — IMO0001 Reserved for inherently not codable concepts without codable children: Secondary | ICD-10-CM

## 2014-12-14 DIAGNOSIS — I10 Essential (primary) hypertension: Secondary | ICD-10-CM

## 2014-12-14 DIAGNOSIS — L299 Pruritus, unspecified: Secondary | ICD-10-CM

## 2014-12-14 DIAGNOSIS — M4850XS Collapsed vertebra, not elsewhere classified, site unspecified, sequela of fracture: Secondary | ICD-10-CM

## 2014-12-14 DIAGNOSIS — H6121 Impacted cerumen, right ear: Secondary | ICD-10-CM

## 2014-12-14 MED ORDER — CARBAMIDE PEROXIDE 6.5 % OT SOLN: 5.0000 [drp] | Freq: Two times a day (BID) | OTIC | Status: DC

## 2014-12-14 MED ORDER — AMLODIPINE BESYLATE 10 MG PO TABS: 10.0000 mg | ORAL_TABLET | Freq: Every day | ORAL | Status: DC

## 2014-12-14 MED ORDER — OXYCODONE-ACETAMINOPHEN 5-325 MG PO TABS: 1.0000 | ORAL_TABLET | Freq: Four times a day (QID) | ORAL | Status: DC | PRN

## 2014-12-14 NOTE — Progress Notes (Signed)
Subjective:   CC: ER f/u for back pain/compression fx  HPI:   Back pain ED f/u Low back pain since seizure 1 week ago. Whole body shaking, wtinessed by husband; pt does not remember episode. Was in the car.  Xray showed possible fracutre - L1; CT ordered to further delineate -  slight cortical irregularity, mild compression deformity of the superior endplate of L1, could reflect an acute or subacute fracture. No evidence of retropulsion 8/10 pain currently, sudden since that shaking episode. Mid low back radiating into right side and occasionally down legs. No worse over past week. No swelling or deformity, no weakness, no incontinence, no perineal numbness. Constipated with percocet. Not taking any stool softener. Ran out 2 days ago. Pain about the same - calms with the medicine. Took 1 tab 1-2 times daily. Pain is worst when laying at night. Not taking anything else for pain.  Seizures Depakoke worked in the past, was taken off because no seizures. Unable to afford further workup/neurology follow up. However, last year, had about 6 seizures.  Hypertension Husband feels BP med needs to be changed Despite meds, per husband, is always high. Taking norvasc 5 mg daily, lisinopril 5mg  daily - out of norvasc for 1 week. Meds without side effects. No chest pain, dizziness, swelling, dyspnea, or leg swelling.  Right ear discomfort Present a few days, no fevers/chills/drainage.   Review of Systems - Per HPI.   Smoking status: Quit 5 years ago.    Objective:  Physical Exam BP 146/106 mmHg  Pulse 80  Temp(Src) 98.3 F (36.8 C) (Oral)  Ht 5\' 6"  (1.676 m)  Wt 239 lb (108.41 kg)  BMI 38.59 kg/m2 GEN: NAD BACK:  Tender throughout lumbar with no erythema, deformity or swelling Normal gait EXTR:  Knee extension 5/5 bilaterally Hip flexion 4+/5 limited by back pain bilaterally HEENT: Right ear with hard cerumen and itching per pt CV: RRR PULM: CTAB, normal effort  Assessment:      Jenna [name] is a 54 y.o. female here for ED follow up after compression fracture.    Plan:     # See problem list and after visit summary for problem-specific plans.   # Health Maintenance: Not discussed  Follow-up: Follow up in 2 weeks for f/u of back pain and blood pressure.      Leona SingletonMaria T Jennel Mara, MD [facility]

## 2014-12-14 NOTE — Patient Instructions (Addendum)
Good to see you.  For the back pain,  - Discuss with Britta MccreedyBarbara to get financial assistance program to see if this will help us get you in to a neurosurgeon. - Avoid strenuous activity, lifting, bending until pain has reduced. Let it be your guide. - I have refilled percocet. Take minimally only when needed. Use ibuprofen OR your naproxen as well.  Use heat or ice therapy and stay active (walking).  For your blood pressure, - Increase norvasc to 10mg  daily. - Call in 2 weeks with 4 BP readings. - Follow up with me in 2 weeks.  Best,  Leona SingletonMaria T Emy Angevine, MD

## 2014-12-17 ENCOUNTER — Other Ambulatory Visit: Payer: Self-pay | Admitting: Family Medicine

## 2014-12-18 DIAGNOSIS — L299 Pruritus, unspecified: Secondary | ICD-10-CM | POA: Insufficient documentation

## 2014-12-18 DIAGNOSIS — M4850XA Collapsed vertebra, not elsewhere classified, site unspecified, initial encounter for fracture: Secondary | ICD-10-CM | POA: Insufficient documentation

## 2014-12-18 NOTE — Assessment & Plan Note (Signed)
Cerumen impaction on exam with hard dark wax and itching. - Debrox prescribed and told to return if no improvement.

## 2014-12-18 NOTE — Assessment & Plan Note (Signed)
High blood pressure also in clinic and reported at home. No symptoms of end-organ damage. - increase norvasc to 10mg  daily - rx'ed - Check BP 4 times and call me in 2 weeks - check BMET in 2-3 months

## 2014-12-18 NOTE — Assessment & Plan Note (Addendum)
Still present, no worsened, no red flag symptoms. Fracture thought due to seizure. ED provider recommended neurosurgery follow up; patient cannot given no insurance at this time. No weakness or obvious instability. Compression fracture listed as "slight."  - avoid strenuous activity, heavy lifting, back twisting till feeling better. - Recommended heat therapy and staying active. - Ibuprofen PRN. - Ordered small dose of percocet again and encouraged only using if severe pain and that we would not continue this chronically; Stool softener recommended - Some concern for osteopenia due to fracture. Consider labs (Vitamin D, PTH, testing for multiple myeloma or other malignancy) and Dexa scan pending insurance coverage - 2-4 weeks of nasal calcitonin if pain uncontrolled at f/u. - Precepted with Dr Gwendlyn Deutscher.

## 2014-12-18 NOTE — Assessment & Plan Note (Signed)
Have not been able to get into neurology for eval due to lack of insurance. Seems to have had increased seizures since stopping depakote. Would benefit from re-eval from neurology. - Discuss financial assistance with Britta MccreedyBarbara. Once has this, we can pursue neurology workup. - Consider starting depakote. Return to discuss.

## 2014-12-19 NOTE — Telephone Encounter (Signed)
Due for thyroid follow up. Filling synthroid for 2 months.   Jenna SingletonMaria T Jakeline Dave, MD

## 2014-12-20 NOTE — Telephone Encounter (Signed)
Informed pt of refill for synthroid for 2 months and she will need a follow up with PCP.  Pt stated she will call back to schedule an appt.  Clovis PuMartin, Lanitra Battaglini L, RN

## 2015-01-03 ENCOUNTER — Other Ambulatory Visit: Payer: Self-pay | Admitting: Family Medicine

## 2015-01-03 MED ORDER — OXYCODONE-ACETAMINOPHEN 5-325 MG PO TABS: 1.0000 | ORAL_TABLET | Freq: Four times a day (QID) | ORAL | Status: DC | PRN

## 2015-01-03 NOTE — Telephone Encounter (Signed)
Pt called to make an appointment but there is nothing until the 15 th. She would like a refill on her Percocet to last until the appointment . jw

## 2015-01-03 NOTE — Telephone Encounter (Signed)
Refilling and leaving rx up front for patient to pick up. Please let her know that this will not continue to be routinely filled for this issue and we will discuss other pain management methods at follow up. She should continue using as little as possible like we discussed to avoid habituation or negative side effects .  Jenna SingletonMaria T Mitchelle Sultan, MD

## 2015-01-03 NOTE — Telephone Encounter (Signed)
Pt is aware that rx is ready for pick up and would send husband to pick up. Georgi Tuel,CMA

## 2015-01-13 ENCOUNTER — Other Ambulatory Visit: Payer: Self-pay | Admitting: Family Medicine

## 2015-01-16 ENCOUNTER — Ambulatory Visit: Payer: Self-pay | Admitting: Family Medicine

## 2015-01-17 ENCOUNTER — Other Ambulatory Visit: Payer: Self-pay | Admitting: *Deleted

## 2015-01-17 NOTE — Telephone Encounter (Signed)
Pt calls to reschedule appt from yesterday (we were closed for inclement weather)  MDs next available is not until 02/01/15.  Pt is requesting refill of percocet to last until that appt.Jenna [name]. [name], Maryjo RochesterJessica Dawn

## 2015-01-19 NOTE — Telephone Encounter (Signed)
Spoke with patient and her original appt was for Monday 01/16/15 and she had to reschedule due to weather.  She ran out on Monday and has been using it every 4-6 hours for her pain.  Kristyn Obyrne,CMA

## 2015-01-19 NOTE — Telephone Encounter (Signed)
On review, I actually left one up front for her early February that we had contacted her to pick up. This should get her to the 3/2 appt. This is not a chronic medication.  Jenna SingletonMaria T Lisbeth Puller, MD

## 2015-01-23 MED ORDER — OXYCODONE-ACETAMINOPHEN 5-325 MG PO TABS: 1.0000 | ORAL_TABLET | Freq: Four times a day (QID) | ORAL | Status: DC | PRN

## 2015-01-23 NOTE — Telephone Encounter (Signed)
Pt is aware of refill and voiced understanding on message. Keegen Heffern,CMA

## 2015-01-23 NOTE — Telephone Encounter (Signed)
Please let her know I am leaving another 15 tab rx up front but this is likely the last refill as we need to use other modalities to treat her pain. This can be what we discuss at her f/u visit.  Jenna SingletonMaria T Carrina Schoenberger, MD

## 2015-02-01 ENCOUNTER — Encounter: Payer: Self-pay | Admitting: Family Medicine

## 2015-02-01 ENCOUNTER — Ambulatory Visit (INDEPENDENT_AMBULATORY_CARE_PROVIDER_SITE_OTHER): Payer: Self-pay | Admitting: Family Medicine

## 2015-02-01 VITALS — BP 134/88 | Temp 98.4°F | Wt 242.0 lb

## 2015-02-01 DIAGNOSIS — M5441 Lumbago with sciatica, right side: Secondary | ICD-10-CM

## 2015-02-01 DIAGNOSIS — M79641 Pain in right hand: Secondary | ICD-10-CM

## 2015-02-01 DIAGNOSIS — M5442 Lumbago with sciatica, left side: Secondary | ICD-10-CM

## 2015-02-01 DIAGNOSIS — IMO0001 Reserved for inherently not codable concepts without codable children: Secondary | ICD-10-CM

## 2015-02-01 DIAGNOSIS — M4850XS Collapsed vertebra, not elsewhere classified, site unspecified, sequela of fracture: Secondary | ICD-10-CM

## 2015-02-01 MED ORDER — CALCITONIN (SALMON) 200 UNIT/ACT NA SOLN: 1.0000 | Freq: Every day | NASAL | Status: DC

## 2015-02-01 MED ORDER — TRAMADOL HCL 50 MG PO TABS: 50.0000 mg | ORAL_TABLET | Freq: Three times a day (TID) | ORAL | Status: DC | PRN

## 2015-02-01 NOTE — Patient Instructions (Signed)
For your back pain, some of your symptoms sound like they may be from the compression fracture but some of them may be more of a bulging disc issue. It would be very helpful to get an MRI if this continues, but I will wait on your insurance coverage. Transition from Percocet to tramadol. Use the tramadol once daily as needed. On bad days you can use up to 3 times daily. Do not use this if you're not having significant pain.  We are also prescribing nasal calcitonin that you will spray in alternate nostrils daily.  Make sure that you're staying physically active as inactivity worsens back pain.  Avoid heavy lifting. Follow-up with me in one month.  If you have any of the red flags we discussed, seek immediate care.    For your right hand swelling, I am not sure what may be causing this, but try using naproxen 1-2 times a day for one week to see if this helps with inflammation.  Follow-up with me to consider further workup if this is not improving within 2-3 weeks.

## 2015-02-01 NOTE — Assessment & Plan Note (Addendum)
Unclear etiology, present for 2 days. No acute injury or overuse. Symptoms are generalized in the entire hand up to the PIP. Mild swelling on exam and otherwise generalized tenderness in wrist, MCP and PIP joints. No obvious effusion. Globally decreased strength, potentially limited by pain. Possible arthritic pain that is radiating into hand.  -Conservative management with one week of naproxen BID. Would favor not using chronically given creatinine of 1.2. -Hypothyroidism, needs to be followed up. Noted after patient left that TSH most recently August 2015 was 13. Will send letter asking patient to make appointment for this. Could be contributing. -Trial of compression if no improvement in one week. -Follow-up in clinic in 2-3 weeks if no improvement. At that time can consider further workup. Limited by lack of insurance coverage.

## 2015-02-01 NOTE — Progress Notes (Signed)
Patient ID: Jenna [name], female   DOB: 1961-07-28, 54 y.o.   MRN: [id number] Subjective:   CC: F/u back pain  HPI:   Follow up back pain History of compression fracture seen in the emergency room 12-03-14. Had been recommended to see neurosurgery but with no insurance this was not possible at this time. At follow-up 12/14/2014, patient was doing well with continued pain but no worsened symptoms. No weakness. Refilled Percocet prescription #15 on 01/23/2015. Today, she reports back is still hurting but has improved a little bit. She reports bilateral radiation of pain from mid back to mid thighs and occasional radiation into feet when she lays down at night with numbness and tingling down the back of both legs. Legs have not really been weak but occasionally give out. Denies numbness or tingling in perineum or bowel or bladder incontinence. Is taking Percocet every 6 hours daily and has 4 tablets left from recent refill. States that compression fracture pain was more in the lumbar region but is also having mid to lower thoracic back pain. Denies much physical activity.  2 days right hand swelling Patient reports 2 days of right hand pain. Pain begins in wrist and extends to proximal interphalangeal joints. There is no focality to pain, except that it is worse on extensor surface of the hand. She thinks that the hand has been a little bit swollen as well. Denies fevers, chills, trauma, or increased use. Has history of carpal tunnel on the right hand but has never had swelling like this before. Denies warmth. It hurts to grab anything because hand feels tight. Denies any other joint pain or involvement. Pain is worse with extension or flexion of the wrist. Has not tried anything yet for the pain.  Review of Systems - Per HPI.   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 She has not tried other options such as Medicaid. Smoking status: Quit 5 years ago.    Objective:  Physical Exam BP 134/88 mmHg  Temp(Src) 98.4 F (36.9 C) (Oral)  Wt 242 lb (109.77 kg) GEN: NAD  Back: No deformity or swelling on inspection Tender lower thoracic and upper lumbar spine and paraspinal muscles Flexion to 90 and extension to about 10 before patient has some reproduced pain Hip flexion 4 out of 5 bilaterally Hip abduction and abduction 5 out of 5 bilaterally Full hip internal next rotation bilaterally without reproduced pain Negative straight leg raise bilaterally  Right hand: Mildly swollen compared to left Tender from wrist to PIP joints bilaterally Full wrist and MCP and PIP joints mobility but with some reproduced pain    no obvious joint effusion Grip strength diminished right compared to left Wrist flexion and extension 4 out of 5 bilaterally with reproduced pain  Assessment:     Jenna DingsJacqueline January is a 54 y.o. female here for f/u back pain and with right hand pain and swelling.    Plan:     # See problem list and after visit summary for problem-specific plans.  Follow-up: Follow up in 2-3 weeks for f/u of right hand pain if not improving.    Leona SingletonMaria T Bow Buntyn, MD [facility]

## 2015-02-01 NOTE — Assessment & Plan Note (Addendum)
Bilateral low back pain likely from compression fracture. Also bilateral thoracic region pain. Describes some radicular symptoms with pain radiates from low back posteriorly into mid thigh with occasional radiation into feet plus numbness and tingling. This suggests some disc herniation. No red flag symptoms. -We'll switch from Percocet to tramadol due to slightly increased safety. -Trial of nasal calcitonin. -Discussed importance of staying physically active, walking even 20 minutes per day, that can improve symptoms. -At f/u would benefit from discussing core strengthening (hip flexors/abductors) -Avoid heavy lifting. -Follow-up in one month. Discussed red flag symptoms that would warrant emergency evaluation. -Still no insurance, did not qualify for [facility] card. Is going to try for Medicaid. Once insurance coverage, can consider further workup including MRI and vitamin D, PTH, multiple myeloma testing or other malignancy text testing, and DEXA scan.

## 2015-03-13 ENCOUNTER — Encounter: Payer: Self-pay | Admitting: *Deleted

## 2015-03-13 ENCOUNTER — Telehealth: Payer: Self-pay | Admitting: Family Medicine

## 2015-03-13 NOTE — Telephone Encounter (Signed)
Pt overdue for follow up of back pain. Had started tramadol PRN last visit to avoid chronic stronger narcotics, but with h/o seizures, would actually rethink this and let patient know it could lower seizure threshold so to avoid if possible. Will send letter stating this. Called and got no answer on phone. Would like pt to follow up for back pain as well.  Jenna SingletonMaria T Tisha Cline, MD

## 2015-03-13 NOTE — Telephone Encounter (Signed)
Called patient to discuss her back pain.   - She states it is no better with tramadol and she has not picked up nasal calcitonin. I urged her to use nasal calcitonin and stay active.  - Discussed risk of increased seizures with tramadol and suggested limiting its use. She states seizures have not worsened and last one was last week. Discussed with her very strong recommendation to not drive for up to 1 year after seizure. Asked her to notify me if seizure activity increases and to follow up with me to discuss seizures. She voiced understanding and stated husband drives her around.  Jenna SingletonMaria T Tatianna Ibbotson, MD

## 2015-03-13 NOTE — Telephone Encounter (Signed)
Will forward to PCP for review. Geneal Huebert, CMA. 

## 2015-03-13 NOTE — Telephone Encounter (Signed)
Pt is returning a call to Dr. Lynann Bologna. jw

## 2015-03-17 ENCOUNTER — Encounter (HOSPITAL_COMMUNITY): Payer: Self-pay | Admitting: *Deleted

## 2015-03-17 ENCOUNTER — Emergency Department (HOSPITAL_COMMUNITY)
Admission: EM | Admit: 2015-03-17 | Discharge: 2015-03-17 | Disposition: A | Discharge status: Home / Self Care | Payer: Self-pay | Attending: Emergency Medicine | Admitting: Emergency Medicine

## 2015-03-17 ENCOUNTER — Emergency Department (HOSPITAL_COMMUNITY): Payer: Self-pay

## 2015-03-17 DIAGNOSIS — X58XXXA Exposure to other specified factors, initial encounter: Secondary | ICD-10-CM | POA: Insufficient documentation

## 2015-03-17 DIAGNOSIS — S8991XA Unspecified injury of right lower leg, initial encounter: Secondary | ICD-10-CM | POA: Insufficient documentation

## 2015-03-17 DIAGNOSIS — M25561 Pain in right knee: Secondary | ICD-10-CM

## 2015-03-17 DIAGNOSIS — I10 Essential (primary) hypertension: Secondary | ICD-10-CM | POA: Insufficient documentation

## 2015-03-17 DIAGNOSIS — Z79899 Other long term (current) drug therapy: Secondary | ICD-10-CM | POA: Insufficient documentation

## 2015-03-17 DIAGNOSIS — Y9389 Activity, other specified: Secondary | ICD-10-CM | POA: Insufficient documentation

## 2015-03-17 DIAGNOSIS — Y998 Other external cause status: Secondary | ICD-10-CM | POA: Insufficient documentation

## 2015-03-17 DIAGNOSIS — Y929 Unspecified place or not applicable: Secondary | ICD-10-CM | POA: Insufficient documentation

## 2015-03-17 DIAGNOSIS — Z87891 Personal history of nicotine dependence: Secondary | ICD-10-CM | POA: Insufficient documentation

## 2015-03-17 DIAGNOSIS — E079 Disorder of thyroid, unspecified: Secondary | ICD-10-CM | POA: Insufficient documentation

## 2015-03-17 MED ORDER — NAPROXEN 500 MG PO TABS: 500.0000 mg | ORAL_TABLET | Freq: Two times a day (BID) | ORAL | Status: DC

## 2015-03-17 MED ORDER — HYDROCODONE-ACETAMINOPHEN 5-325 MG PO TABS
2.0000 | ORAL_TABLET | Freq: Once | ORAL | Status: AC
Start: 2015-03-17 — End: 2015-03-17
  Administered 2015-03-17: 2 via ORAL
  Filled 2015-03-17: qty 2

## 2015-03-17 MED ORDER — HYDROCODONE-ACETAMINOPHEN 5-325 MG PO TABS: 1.0000 | ORAL_TABLET | Freq: Four times a day (QID) | ORAL | Status: DC | PRN

## 2015-03-17 NOTE — ED Provider Notes (Signed)
CSN: [id number]     Arrival date & time 03/17/15  1901 History   First MD Initiated Contact with Patient 03/17/15 1917     Chief Complaint  Patient presents with  . Knee Injury   The history is provided by the patient. No language interpreter was used.   This chart was scribed for non-physician practitioner Santiago Glad, PA-C, working with Tilden Fossa, MD, by Andrew Au, ED Scribe. This patient was seen in room TR08C/TR08C and the patient's care was started at 7:18 PM.  Jenna [name] is a 54 y.o. female who presents to the Emergency Department complaining of right knee pain that began 2 hours ago. Pt states while squatting to sit on the floor she felt pain in her right knee. Pt has been limping using her significant others cane since the injury. Pt has not taken medication for the pain and is unable to take tramadol due to medication triggering her seizure.  She denies numbness or tingling.  She does report associated swelling of the knee.    Past Medical History  Diagnosis Date  . Hypertension   . Thyroid disease   . Seizures    Past Surgical History  Procedure Laterality Date  . Total thyroidectomy  2010    primary hyperparathyroidism  . Parathyroidectomy  2010    primary hyperparpthyroidism  . Uterine ablation  2009    menorrhagia and severe anemia   No family history on file. History  Substance Use Topics  . Smoking status: Former Smoker    Types: Cigarettes    Quit date: 06/13/2010  . Smokeless tobacco: Not on file  . Alcohol Use: No   OB History    No data available     Review of Systems  Musculoskeletal: Positive for myalgias, arthralgias and gait problem.   Allergies  Review of patient's allergies indicates no known allergies.  Home Medications   Prior to Admission medications   Medication Sig Start Date End Date Taking? Authorizing Provider  amLODipine (NORVASC) 10 MG tablet Take 1 tablet (10 mg total) by mouth daily. 12/14/14   Leona Singleton, MD  calcitonin, salmon, (MIACALCIN/FORTICAL) 200 UNIT/ACT nasal spray Place 1 spray into alternate nostrils daily. 02/01/15   Leona Singleton, MD  carbamide peroxide (DEBROX) 6.5 % otic solution Place 5 drops into both ears 2 (two) times daily. 12/14/14   Leona Singleton, MD  cyclobenzaprine (FLEXERIL) 5 MG tablet Take 1 tablet (5 mg total) by mouth 3 (three) times daily as needed for muscle spasms. Patient not taking: Reported on 12/03/2014 08/16/14   Leona Singleton, MD  fluconazole (DIFLUCAN) 150 MG tablet Take 1 tablet (150 mg total) by mouth once. Take 2nd tablet if symptoms not improved the next day. Patient not taking: Reported on 12/03/2014 07/27/14   Leona Singleton, MD  levothyroxine (SYNTHROID, LEVOTHROID) 125 MCG tablet Take 125 mcg by mouth daily. Needs MD appointment 08/23/13   Leona Singleton, MD  levothyroxine (SYNTHROID, LEVOTHROID) 125 MCG tablet Take 1 tablet (125 mcg total) by mouth daily before breakfast. Due for thyroid follow up with PCP. 12/19/14   Leona Singleton, MD  lisinopril (PRINIVIL,ZESTRIL) 5 MG tablet Take 1 tablet (5 mg total) by mouth daily. Return for PCP evaluation in ~1 month 01/15/15   Leona Singleton, MD  naproxen (NAPROSYN) 500 MG tablet Take 1 tablet (500 mg total) by mouth 2 (two) times daily with a meal. Patient not taking: Reported on 12/03/2014 08/16/14   Byrd Hesselbach  Otila Back Thekkekandam, MD  traMADol (ULTRAM) 50 MG tablet Take 1 tablet (50 mg total) by mouth every 8 (eight) hours as needed. 02/01/15   Leona SingletonMaria T Thekkekandam, MD   BP 173/98 mmHg  Pulse 71  Temp(Src) 98.6 F (37 C) (Oral)  Resp 20  Wt 238 lb (107.956 kg)  SpO2 96% Physical Exam  Constitutional: She is oriented to person, place, and time. She appears well-developed and well-nourished. No distress.  HENT:  Head: Normocephalic and atraumatic.  Eyes: Conjunctivae and EOM are normal.  Neck: Neck supple.  Cardiovascular: Normal rate, regular rhythm and normal heart  sounds.   Pulses:      Dorsalis pedis pulses are 2+ on the right side.  Pulmonary/Chest: Effort normal and breath sounds normal.  Musculoskeletal: Normal range of motion.       Right hip: She exhibits normal range of motion.       Right knee: She exhibits no swelling, no erythema and no LCL laxity. Tenderness found. Medial joint line tenderness noted.  Tenderness to palpation over medial joint line of right knee.  No obvious erythema, edema or warmth. Pain with flexion and extension of the right knee. No obvious laxity of the ligaments with posterior and anterior drawer. 2+ DP. Full ROM right ankle without pain.   Neurological: She is alert and oriented to person, place, and time.  Distal sensation of the right foot intact  Skin: Skin is warm and dry.  Psychiatric: She has a normal mood and affect. Her behavior is normal.  Nursing note and vitals reviewed.   ED Course  Procedures (including critical care time) DIAGNOSTIC STUDIES: Oxygen Saturation is 96% on RA, normal by my interpretation.    COORDINATION OF CARE: 7:31 PM- Pt advised of plan for treatment and pt agrees.  Labs Review Labs Reviewed - No data to display  Imaging Review Dg Knee Complete 4 Views Right  03/17/2015   CLINICAL DATA:  Right knee pain after injury. Injury while bending down to pick something up earlier today.  EXAM: RIGHT KNEE - COMPLETE 4+ VIEW  COMPARISON:  09/25/2010  FINDINGS: No fracture or dislocation. The alignment is maintained. Very minimal medial tibial femoral joint space narrowing with peripheral spurs. Tiny inferior patellar spurs. No joint effusion or focal soft tissue abnormality.  IMPRESSION: No fracture or dislocation of the right knee.   Electronically Signed   By: Rubye OaksMelanie  Ehinger M.D.   On: 03/17/2015 20:02     EKG Interpretation None      MDM   Final diagnoses:  None   Patient presents today with right knee pain after injuring it earlier today.  Xray is negative for fracture or  dislocation.  No signs of infection.  No obvious laxity of the knee.  Patient given knee sleeve.  Declined crutches.  Stable for discharge.  Return precautions given.   Santiago GladHeather Haidan Nhan, PA-C 03/17/15 2034  Santiago GladHeather Noelia Lenart, PA-C 03/17/15 2034  Tilden FossaElizabeth Rees, MD 03/17/15 2122

## 2015-03-17 NOTE — ED Notes (Signed)
The pt sat down on the floor earlier today when she did her rt knee popped and since then  She has had pain there.  lmp none

## 2015-03-23 ENCOUNTER — Encounter: Payer: Self-pay | Admitting: Family Medicine

## 2015-03-23 ENCOUNTER — Ambulatory Visit (INDEPENDENT_AMBULATORY_CARE_PROVIDER_SITE_OTHER): Payer: Self-pay | Admitting: Family Medicine

## 2015-03-23 VITALS — BP 154/108 | HR 72 | Temp 98.6°F | Ht 66.0 in | Wt 238.0 lb

## 2015-03-23 DIAGNOSIS — R569 Unspecified convulsions: Secondary | ICD-10-CM

## 2015-03-23 DIAGNOSIS — M25569 Pain in unspecified knee: Secondary | ICD-10-CM

## 2015-03-23 MED ORDER — DIVALPROEX SODIUM 500 MG PO DR TAB: 500.0000 mg | DELAYED_RELEASE_TABLET | Freq: Two times a day (BID) | ORAL | Status: DC

## 2015-03-23 NOTE — Progress Notes (Signed)
Patient ID: Jenna [name], female   DOB: 09-23-1961, 54 y.o.   MRN: [id number] Subjective:   CC: right knee pain, seizures  HPI:   Right knee pain Patient states this occurred prior to ED visit 4/15, when she fell on right side of right knee and feels like her kneecap got pushed medially. She had immediate pain and swelling and went to ED, where xray was negative, so they gave her compression sleeve which she did not wear as it was too large reportedly. It is still swollen though swelling has slightly reduced. Pain on medial and inferior to kneecap. Pain shoots to her ankles. Denies numbness/tingling. States knee has been buckling. Ice pack worsens pain. Has not tried heat. Vicodin helped a little but naproxen did not. Denies fevers/chills.  Seizures States these are happening more frequently. Per husband, occasionally gets blank stare, does not answer, does not know what is happening. When this episode ends, grabs head and seems confused. Has been incontinent of urine and had full convulsions (arms and legs) occasionally. 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 On depakote in the past that helped and does not recall any adverse effects. Stopped due to seizures seeming well controlled, but they have restarted since. She becomes tearful when discussing being frightened of having another seizure. Husband drives her places.    Review of Systems - Per HPI.   PMH - Hypothyroidism, hyperparathyroidism, obesity, HTN, shaking spells/NOS seizure d/o hx, vertebral compression fx, arthralgias, dequervain's, carpal tunnel    Objective:  Physical Exam BP 154/108 mmHg  Pulse 72  Temp(Src) 98.6 F (37 C) (Oral)  Ht 5\' 6"  (1.676 m)  Wt 238 lb (107.956 kg)  BMI 38.43 kg/m2 GEN: NAD EXTR: Right knee with no obvious effusion; tenderness throughout but worst at medial lower knee with mild swelling of pes anserine bursa, increased tenderness lateral joint line, pain along patella; no patellar laxity; good  strength to knee flexion/extension, limited some by pain; no laxity to lachman's or posterior drawer, valgus or varus strain NEURO: Awake, alert, no focal deficit, normal speech    Assessment:     Jenna [name] is a 54 y.o. female here for right knee pain and seizures.    Plan:     # See problem list and after visit summary for problem-specific plans. - BP elevated today      - Recheck on f/u.  # Health Maintenance: Not discussed  Follow-up: Follow up in 2 weeks for f/u of seizures.   Leona SingletonMaria T Maddax Palinkas, MD [facility]

## 2015-03-23 NOTE — Patient Instructions (Signed)
For your right knee, this should improve on its own and is likely bruising vs bursitis. Continue ice, heat, and compression. Stay active as possible to avoid worsening of pain.  For your seizures, restart depakote 500mg  twice daily. If you want, you can start with very low dose 1/2 tablet twice daily and increase to 1 tablet twice daily after 3-5days. Follow up with me in 2 weeks. We are getting baseline labwork. Do not drive.  Best,  Jenna SingletonMaria T Damacio Weisgerber, MD

## 2015-03-24 LAB — CBC
HCT: 45.3 % (ref 36.0–46.0)
HEMOGLOBIN: 15.5 g/dL — AB (ref 12.0–15.0)
MCH: 26.9 pg (ref 26.0–34.0)
MCHC: 34.2 g/dL (ref 30.0–36.0)
MCV: 78.6 fL (ref 78.0–100.0)
MPV: 9.2 fL (ref 8.6–12.4)
PLATELETS: 257 10*3/uL (ref 150–400)
RBC: 5.76 MIL/uL — AB (ref 3.87–5.11)
RDW: 16.6 % — AB (ref 11.5–15.5)
WBC: 9 10*3/uL (ref 4.0–10.5)

## 2015-03-24 LAB — COMPREHENSIVE METABOLIC PANEL
ALT: 15 U/L (ref 0–35)
AST: 17 U/L (ref 0–37)
Albumin: 4.4 g/dL (ref 3.5–5.2)
Alkaline Phosphatase: 70 U/L (ref 39–117)
BILIRUBIN TOTAL: 0.5 mg/dL (ref 0.2–1.2)
BUN: 7 mg/dL (ref 6–23)
CO2: 29 meq/L (ref 19–32)
CREATININE: 1.16 mg/dL — AB (ref 0.50–1.10)
Calcium: 9.9 mg/dL (ref 8.4–10.5)
Chloride: 102 mEq/L (ref 96–112)
GLUCOSE: 117 mg/dL — AB (ref 70–99)
Potassium: 3.6 mEq/L (ref 3.5–5.3)
Sodium: 142 mEq/L (ref 135–145)
Total Protein: 7.9 g/dL (ref 6.0–8.3)

## 2015-03-24 LAB — PROTIME-INR
INR: 0.99 (ref <1.50)
Prothrombin Time: 13.1 seconds (ref 11.6–15.2)

## 2015-03-24 LAB — TSH: TSH: 49.578 u[IU]/mL — ABNORMAL HIGH (ref 0.350–4.500)

## 2015-03-24 NOTE — Assessment & Plan Note (Signed)
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 Prior depakote use that reportedly helped. Unable to afford neurology appt at this time but still reports is hoping for medicaid. - Please take active steps to see if qualify for medicaid. Also suggested contacting neuro to inquire re: price of ofc visit. - Restarted depakote, 250mg  BID, increase in 3-4 days to 500mg  BID and f/u in 2 weeks. - Baseline labs today: CBC, CMET, TSH, PT/PTT - Discussed no driving and no tramadol or any narcotic to help prevent seizure activity.

## 2015-03-24 NOTE — Assessment & Plan Note (Signed)
Right knee pain with no effusion or erythema on exam, no laxity of joint or patella, tenderenss medial inferior knee possibly pes anserine bursitis. Also lat joint line possibly meniscal degenerative tear.  - Compression with sleeve suggested, ACE bandage given in mean time - Pt asked for narcotic; discussed that this is not appropriate for this condition as she is now ~1 week out, and instead continue ice and heat. - Stay active - F/u if not improving 1 month. Could consider MRI though pt has no insurance.

## 2015-03-25 ENCOUNTER — Telehealth: Payer: Self-pay | Admitting: Family Medicine

## 2015-03-25 NOTE — Telephone Encounter (Signed)
Called and left voicemail asking patient to call clinic. Please notify her of the below when she calls. Also let her know I will send a letter with more detailed information.  - TSH very elevated to 49.5 from 13 last check. Need to verify she is being compliant with DAILY synthroid 125 micrograms. If no, needs compliance. If yes, needs increased dose. Let me know.  - Other labs essentially normal.  Documentation:   [CBC (except mildly elevated Hgb 15.5 that could be mild dehydration), CMET (stable creatinine 1.16), PT/INR]. These were screening labs and we do not need to make any changes based on them.  Jenna SingletonMaria T Justinian Miano, MD

## 2015-03-27 NOTE — Telephone Encounter (Signed)
Pt returned call

## 2015-03-27 NOTE — Telephone Encounter (Signed)
Sounds great - thank you.  Leona SingletonMaria T Wynelle Dreier, MD

## 2015-03-27 NOTE — Telephone Encounter (Signed)
Spoke with patient and she has not been taking her medication daily.  Advised patient to start taking her prescribed dose of 125mcg and to follow up with MD as told at last visit.  Per pcp patient should come back in 2 weeks so appt was made for 04/11/2015. Darienne Belleau,CMA

## 2015-04-11 ENCOUNTER — Encounter: Payer: Self-pay | Admitting: Family Medicine

## 2015-04-11 ENCOUNTER — Ambulatory Visit (INDEPENDENT_AMBULATORY_CARE_PROVIDER_SITE_OTHER): Payer: Self-pay | Admitting: Family Medicine

## 2015-04-11 VITALS — BP 140/90 | HR 72 | Temp 98.5°F | Ht 66.0 in | Wt 239.0 lb

## 2015-04-11 DIAGNOSIS — M25561 Pain in right knee: Secondary | ICD-10-CM

## 2015-04-11 DIAGNOSIS — E890 Postprocedural hypothyroidism: Secondary | ICD-10-CM

## 2015-04-11 DIAGNOSIS — R569 Unspecified convulsions: Secondary | ICD-10-CM

## 2015-04-11 MED ORDER — METHYLPREDNISOLONE ACETATE 40 MG/ML IJ SUSP
40.0000 mg | Freq: Once | INTRAMUSCULAR | Status: AC
  Administered 2015-04-11: 40 mg via INTRA_ARTICULAR

## 2015-04-11 NOTE — Patient Instructions (Signed)
For your thyroid symptoms, continue taking medication every single day alone without any other medication. Follow-up in about one to one and half months and we can recheck your values.  For your seizures, cut your medication in half and take one half tablet twice daily. Hopefully this will help with the drowsiness. Follow-up in one month for seizures.  For your knee pain, we did an injection today. Hopefully this will help with her symptoms. Watch out for any signs of infection. Try wearing the knee compression sleeve every day but not while asleep. The mainstay of treatment is knee rehabilitation exercises. Be sure to do this at home every day and let me know when you have insurance so we can consider  formal physical therapy. Follow-up in 2-3 months.   Leona SingletonMaria T Taeveon Keesling, MD

## 2015-04-11 NOTE — Progress Notes (Signed)
Patient ID: Jenna [name], female   DOB: 1960-12-22, 54 y.o.   MRN: [id number] Subjective:   CC: Follow-up thyroid, knee pain, seizures  HPI:   Follow-up thyroid  Patient has been compliant with Synthroid 125 g since around 03/25/2015. She is still feeling very fatigued and has not really noticed a difference. She denies any worsening of symptoms or medication side effects.  Follow-up seizures Patient has been on Depakote 500 mg twice daily since last visit. She states that this makes her more drowsy than she would like to be. She has not had any seizures since that time which she states is an improvement. She denies any other medication side effects.  Follow-up knee pain Patient states that using compression on her right knee has not helped symptoms at all and it is still very painful. We discussed not using narcotics at the last visit due to her seizure history and that it is inappropriate use of long-term narcotics. She denies fevers, chills, or knee swelling. She states she had an injection in her knee years ago that improved symptoms. She still does not have insurance coverage to get imaging on the knee.    Review of Systems - Per HPI.   PMH: Hypothyroidism, hyperparathyroidism, obesity, HTN, shaking spells/NOS seizure d/o hx, vertebral compression fx, arthralgias, dequervain's, carpal tunnel    Objective:  Physical Exam BP 140/90 mmHg  Pulse 72  Temp(Src) 98.5 F (36.9 C) (Oral)  Ht 5\' 6"  (1.676 m)  Wt 239 lb (108.41 kg)  BMI 38.59 kg/m2 GEN: NAD Neuro: Awake, alert, no focal deficits Extremities: Right knee with no obvious deformity or effusion, no erythema, mild anterior patellar tenderness along with medial joint line tenderness to palpation  Procedure:  Injection of right knee, medial approach. Consent obtained and verified. Time-out conducted. Noted no overlying erythema, induration, or other signs of local infection. Skin prepped in a sterile fashion. Topical  analgesic spray: Ethyl chloride. Completed without difficulty. Meds: 1:4 40mg  methylprednisolone and 2% lidocaine WITHOUT epi Pain immediately improved suggesting accurate placement of the medication. Advised to call if fevers/chills, erythema, induration, drainage, or persistent bleeding.  Assessment:     Jenna [name] is a 54 y.o. female here for follow up of thyroid, seizures, and right knee pain.    Plan:     # See problem list and after visit summary for problem-specific plans.   # Health Maintenance: Not discussed.  - Needs f/u for BP.  Follow-up: Follow up in 2-3 months for f/u of knee pain, 1-2 months for seizures and hypothyroidism.   Leona SingletonMaria T Loana Salvaggio, MD [facility]

## 2015-04-12 NOTE — Assessment & Plan Note (Signed)
Hypothyroidism, supplementing with Synthroid 125 g that patient was previously noncompliant with. -Discussed compliance. -Will recheck thyroid studies in about 1-2 months.

## 2015-04-12 NOTE — Assessment & Plan Note (Signed)
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 However, improvement with Depakote with no seizures last 2 weeks. -Decreased depakote to 250 mg twice daily due to reported drowsiness. -Follow-up in 1 to 2 months. -If insurance acquired, would refer to neurology.

## 2015-04-12 NOTE — Assessment & Plan Note (Signed)
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 Unable to obtain advanced imaging due to no insurance coverage. Patient still complaining of symptoms. -Amenable to steroid injection. Performed today and patient tolerated well. Return precautions reviewed. -Continue using compression sleeve during the day. -Provided with the knee rehabilitation exercises to strengthen quadriceps muscles and described this is mainstay of tx. -Follow-up in 2-3 months. If insurance, can perform MRI for further eval.

## 2015-04-14 ENCOUNTER — Telehealth: Payer: Self-pay | Admitting: Family Medicine

## 2015-04-18 NOTE — Telephone Encounter (Signed)
LM for patient that medication was called into pharmacy. Hadi Dubin,CMA  

## 2015-04-18 NOTE — Telephone Encounter (Signed)
Just filled. Request had only been sent to me yesterday morning.   Leona SingletonMaria T Kiara Mcdowell, MD

## 2015-04-18 NOTE — Telephone Encounter (Signed)
Pt is calling to check the status of her refill request from 04/14/15. Please let patient know what she should do. jw

## 2015-04-18 NOTE — Telephone Encounter (Signed)
Will forward to PCP for review. Ervie Mccard, CMA. 

## 2015-05-09 ENCOUNTER — Encounter: Payer: Self-pay | Admitting: Family Medicine

## 2015-05-09 ENCOUNTER — Ambulatory Visit (INDEPENDENT_AMBULATORY_CARE_PROVIDER_SITE_OTHER): Payer: Self-pay | Admitting: Family Medicine

## 2015-05-09 VITALS — BP 147/93 | HR 68 | Temp 98.1°F | Wt 243.1 lb

## 2015-05-09 DIAGNOSIS — R569 Unspecified convulsions: Secondary | ICD-10-CM

## 2015-05-09 DIAGNOSIS — R1013 Epigastric pain: Secondary | ICD-10-CM

## 2015-05-09 DIAGNOSIS — E890 Postprocedural hypothyroidism: Secondary | ICD-10-CM

## 2015-05-09 DIAGNOSIS — M79644 Pain in right finger(s): Secondary | ICD-10-CM

## 2015-05-09 NOTE — Progress Notes (Signed)
Patient ID: Jenna [name], female   DOB: [birth year], 54 y.o.   MRN: [id number] Subjective:   CC: Follow-up seizures and hypothyroidism  HPI:   Follow-up seizures Patient states that she has been taking one half tablet twice daily for dose of 250 mg twice daily and drowsiness has resolved. She has also not had any seizures since mid April. She is still not driving. She feels like symptoms are well-controlled.  Follow-up hypothyroidism She reports compliance with her Synthroid 125 g daily. She denies any side effects other than dry mouth every morning she wakes up, but she has had this chronically. She does not really feel any improvement in her fatigue.  Right thumb pain Patient states she has had this pain chronically and intermittently worsens, most recently in the past few days. It seems a little swollen compared to the left. She denies fevers, chills, or weakness.   Abdominal pain Epigastric, present a few days, worse when she drinks [facility] and when she lays on her stomach, which she drinks more than water. Also some palpitations. No vomiting, diarrhea, or severe pain. No chest pain, dyspnea, diaphoresis, or syncope. Normal BMs.  Review of Systems - Per HPI.  PMH: Reviewed SH: Currently not driving    Objective:  Physical Exam BP 147/93 mmHg  Pulse 68  Temp(Src) 98.1 F (36.7 C) (Oral)  Wt 243 lb 2 oz (110.281 kg) GEN: NAD CV: RRR, no m/r/g PULM: CTAB, normal effort ABD: S/ND, mild epigastric discomfort EXTR: No LE edema or calf tenderness; right first MCP joint hypertrophied and mildly tender, no erythema, warmth, or induration NEURO: Awake, alert, no focal deficits, normal speech and gait    Assessment:     Jenna [name] is a 54 y.o. female here for f/u hypothyroidism and seizures.    Plan:     # See problem list and after visit summary for problem-specific plans.    # Health Maintenance: Not discussed  Follow-up: Follow up in ~1 month for f/u of  hypothyroidism and to meet new PCP.   Leona SingletonMaria T Emanii Bugbee, MD [facility]

## 2015-05-10 DIAGNOSIS — R1013 Epigastric pain: Secondary | ICD-10-CM | POA: Insufficient documentation

## 2015-05-10 DIAGNOSIS — M79644 Pain in right finger(s): Secondary | ICD-10-CM | POA: Insufficient documentation

## 2015-05-10 MED ORDER — DIVALPROEX SODIUM 250 MG PO DR TAB: 250.0000 mg | DELAYED_RELEASE_TABLET | Freq: Two times a day (BID) | ORAL | Status: DC

## 2015-05-10 NOTE — Assessment & Plan Note (Addendum)
Likely an element of GERD as patient drinks lots of caffeinated beverage and minimal water and pain is worse when she lies on her abdomen. Abdominal exam nonacute with mild epigastric discomfort. -Discussed significantly decreasing mountain dew intake and avoiding NSAIDs for now and other sodas and caffeine -Drink more water -Follow-up in 2-4 weeks if not improving for consideration of PPI.

## 2015-05-10 NOTE — Assessment & Plan Note (Addendum)
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 However patient has had improvement since mid April with Depakote. -Continue Depakote 250 mg twice daily (decreased last visit due to drowsy). Refilled. -Once patient has insurance, refer to neurology for further management and evaluation -PT, LFTs, CBC normal (except mildly elevated Hgb) at initiation of therapy. At f/u, consider repeat these labs and check valproate level.

## 2015-05-10 NOTE — Assessment & Plan Note (Signed)
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 Pt brought this up end of visit so did not fully evaluate but it appears to be arthritis on exam. Discussed option of compression vs heat therapy vs possible referral for hot wax therapy and ultimate option of surgery.  -Try heat and resting hand. -F/u 1-2 weeks for full eval if not improving.

## 2015-05-10 NOTE — Assessment & Plan Note (Addendum)
Patient reports compliance with Synthroid 125 g daily. -Continue medication. -Follow-up in about one month to recheck TSH and fatigue symptoms.

## 2015-06-14 ENCOUNTER — Encounter (HOSPITAL_COMMUNITY): Payer: Self-pay | Admitting: Emergency Medicine

## 2015-06-14 ENCOUNTER — Emergency Department (HOSPITAL_COMMUNITY)
Admission: EM | Admit: 2015-06-14 | Discharge: 2015-06-14 | Disposition: A | Discharge status: Home / Self Care | Payer: Self-pay | Attending: Emergency Medicine | Admitting: Emergency Medicine

## 2015-06-14 DIAGNOSIS — M1990 Unspecified osteoarthritis, unspecified site: Secondary | ICD-10-CM | POA: Insufficient documentation

## 2015-06-14 DIAGNOSIS — Z79899 Other long term (current) drug therapy: Secondary | ICD-10-CM | POA: Insufficient documentation

## 2015-06-14 DIAGNOSIS — Z9889 Other specified postprocedural states: Secondary | ICD-10-CM | POA: Insufficient documentation

## 2015-06-14 DIAGNOSIS — I10 Essential (primary) hypertension: Secondary | ICD-10-CM | POA: Insufficient documentation

## 2015-06-14 DIAGNOSIS — G40909 Epilepsy, unspecified, not intractable, without status epilepticus: Secondary | ICD-10-CM | POA: Insufficient documentation

## 2015-06-14 DIAGNOSIS — E079 Disorder of thyroid, unspecified: Secondary | ICD-10-CM | POA: Insufficient documentation

## 2015-06-14 DIAGNOSIS — M25441 Effusion, right hand: Secondary | ICD-10-CM | POA: Insufficient documentation

## 2015-06-14 DIAGNOSIS — M79641 Pain in right hand: Secondary | ICD-10-CM | POA: Insufficient documentation

## 2015-06-14 DIAGNOSIS — Z87891 Personal history of nicotine dependence: Secondary | ICD-10-CM | POA: Insufficient documentation

## 2015-06-14 MED ORDER — NAPROXEN 500 MG PO TABS: 500.0000 mg | ORAL_TABLET | Freq: Two times a day (BID) | ORAL | Status: DC

## 2015-06-14 NOTE — ED Provider Notes (Signed)
CSN: [id number]     Arrival date & time 06/14/15  1653 History   This chart was scribed for non-physician practitioner Joycie PeekBenjamin Vi Whitesel, PA-C working with Gilda Creasehristopher J Pollina, MD by Lyndel SafeKaitlyn Shelton, ED Scribe. This patient was seen in room TR01C/TR01C and the patient's care was started at 6:22 PM.    Chief Complaint  Patient presents with  . Hand Pain   The history is provided by the patient. No language interpreter was used.    HPI Comments: Jenna [name] is a 54 y.o. female, with a PMhx of arthritis, HTN, thyroid disease, and seizure disorder, who presents to the Emergency Department complaining of sudden onset, constant, moderate right hand pain and swelling onset 2 days without injury or trauma to right hand. She describes the pain as a tightness and reports the pain is worse in her thenar eminence and knuckles, with radiation of the pain to her right wrist. She has not tried any alleviating treatments or medication pta. Denies history of renal complications, GI bleeds, gout, RA, or psoriasis. Additionally denies numbness or weakness in right extremity.   Past Medical History  Diagnosis Date  . Hypertension   . Thyroid disease   . Seizures    Past Surgical History  Procedure Laterality Date  . Total thyroidectomy  2010    primary hyperparathyroidism  . Parathyroidectomy  2010    primary hyperparpthyroidism  . Uterine ablation  2009    menorrhagia and severe anemia   No family history on file. History  Substance Use Topics  . Smoking status: Former Smoker    Types: Cigarettes    Quit date: 06/13/2010  . Smokeless tobacco: Not on file  . Alcohol Use: No   OB History    No data available     Review of Systems  Musculoskeletal: Positive for joint swelling and arthralgias.  Neurological: Negative for weakness and numbness.  All other systems reviewed and are negative.    Allergies  Tramadol  Home Medications   Prior to Admission medications   Medication Sig  Start Date End Date Taking? Authorizing Provider  amLODipine (NORVASC) 10 MG tablet Take 1 tablet (10 mg total) by mouth daily. 12/14/14   Leona SingletonMaria T Thekkekandam, MD  calcitonin, salmon, (MIACALCIN/FORTICAL) 200 UNIT/ACT nasal spray Place 1 spray into alternate nostrils daily. 02/01/15   Leona SingletonMaria T Thekkekandam, MD  carbamide peroxide (DEBROX) 6.5 % otic solution Place 5 drops into both ears 2 (two) times daily. 12/14/14   Leona SingletonMaria T Thekkekandam, MD  cyclobenzaprine (FLEXERIL) 5 MG tablet Take 1 tablet (5 mg total) by mouth 3 (three) times daily as needed for muscle spasms. Patient not taking: Reported on 12/03/2014 08/16/14   Leona SingletonMaria T Thekkekandam, MD  divalproex (DEPAKOTE) 250 MG DR tablet Take 1 tablet (250 mg total) by mouth 2 (two) times daily. 05/10/15   Leona SingletonMaria T Thekkekandam, MD  fluconazole (DIFLUCAN) 150 MG tablet Take 1 tablet (150 mg total) by mouth once. Take 2nd tablet if symptoms not improved the next day. Patient not taking: Reported on 12/03/2014 07/27/14   Leona SingletonMaria T Thekkekandam, MD  HYDROcodone-acetaminophen (NORCO/VICODIN) 5-325 MG per tablet Take 1-2 tablets by mouth every 6 (six) hours as needed. 03/17/15   Heather Laisure, PA-C  levothyroxine (SYNTHROID, LEVOTHROID) 125 MCG tablet Take 125 mcg by mouth daily. Needs MD appointment 08/23/13   Leona SingletonMaria T Thekkekandam, MD  levothyroxine (SYNTHROID, LEVOTHROID) 125 MCG tablet TAKE ONE TABLET BY MOUTH ONCE DAILY BEFORE BREAKFAST 04/18/15   Leona SingletonMaria T Thekkekandam, MD  lisinopril (  PRINIVIL,ZESTRIL) 5 MG tablet Take 1 tablet (5 mg total) by mouth daily. 04/18/15   Leona Singleton, MD  naproxen (NAPROSYN) 500 MG tablet Take 1 tablet (500 mg total) by mouth 2 (two) times daily. 06/14/15   Chioke Noxon, PA-C   BP 167/109 mmHg  Pulse 73  Temp(Src) 98.2 F (36.8 C) (Oral)  Resp 18  Wt 246 lb 14.4 oz (111.993 kg)  SpO2 98% Physical Exam  Constitutional: She is oriented to person, place, and time. She appears well-developed and well-nourished. No distress.   HENT:  Head: Normocephalic.  Right Ear: External ear normal.  Left Ear: External ear normal.  Mouth/Throat: No oropharyngeal exudate.  Eyes: Pupils are equal, round, and reactive to light. Right eye exhibits no discharge. Left eye exhibits no discharge. No scleral icterus.  Neck: No JVD present.  Cardiovascular: Normal rate, regular rhythm and normal heart sounds.   Pulmonary/Chest: Effort normal and breath sounds normal. No respiratory distress.  Musculoskeletal: She exhibits no edema.  Right hand; FROM; grip strength decreased secondary to pain; no obvious lesions or deformities; no over erythema or warmth.   Lymphadenopathy:    She has no cervical adenopathy.  Neurological: She is alert and oriented to person, place, and time.  Skin: Skin is warm. No rash noted. No erythema. No pallor.  Psychiatric: She has a normal mood and affect. Her behavior is normal.  Nursing note and vitals reviewed.   ED Course  Procedures  DIAGNOSTIC STUDIES: Oxygen Saturation is 98% on RA, normal by my interpretation.    COORDINATION OF CARE: 6:24 PM Discussed treatment plan with pt. Pt acknowledges and agrees to plan.   Labs Review Labs Reviewed - No data to display  Imaging Review No results found.   EKG Interpretation None     Meds given in ED:  Medications - No data to display  Discharge Medication List as of 06/14/2015  6:47 PM     Filed Vitals:   06/14/15 1732 06/14/15 1857  BP: 167/109 132/76  Pulse: 73 87  Temp: 98.2 F (36.8 C) 98 F (36.7 C)  TempSrc: Oral   Resp: 18 17  Weight: 246 lb 14.4 oz (111.993 kg)   SpO2: 98% 98%    MDM  Vitals stable - WNL -afebrile Pt resting comfortably in ED. PE--physical exam shows no evidence of vascular compromise, gout, hemarthrosis, septic joint. Discomfort likely secondary to arthritis. Discussed continued use of anti-inflammatory's, using ice. No evidence of other acute or emergent pathology at this time. I discussed all  relevant lab findings and imaging results with pt and they verbalized understanding. Discussed f/u with PCP within 48 hrs and return precautions, pt very amenable to plan.  Final diagnoses:  Right hand pain    I personally performed the services described in this documentation, which was scribed in my presence. The recorded information has been reviewed and is accurate.   Joycie Peek, PA-C 06/15/15 0004  Gilda Crease, MD 06/15/15 [phone]

## 2015-06-14 NOTE — ED Notes (Signed)
C/o R hand and wrist pain and swelling x 2 days.  No known injury.

## 2015-06-14 NOTE — Discharge Instructions (Signed)
You were evaluated in the ED for your right hand pain. There does not appear to be an emergent cause her symptoms at this time. You may use ice therapy as well as anti-inflammatory's to help with your discomfort. It is important for you to follow-up with primary care for further evaluation management of your symptoms. Return to ED for new or worsening symptoms.

## 2015-07-15 ENCOUNTER — Emergency Department (HOSPITAL_COMMUNITY)
Admission: EM | Admit: 2015-07-15 | Discharge: 2015-07-15 | Disposition: A | Discharge status: Home / Self Care | Payer: Self-pay | Attending: Emergency Medicine | Admitting: Emergency Medicine

## 2015-07-15 ENCOUNTER — Emergency Department (HOSPITAL_COMMUNITY): Payer: Self-pay

## 2015-07-15 ENCOUNTER — Encounter (HOSPITAL_COMMUNITY): Payer: Self-pay | Admitting: *Deleted

## 2015-07-15 DIAGNOSIS — W07XXXA Fall from chair, initial encounter: Secondary | ICD-10-CM | POA: Insufficient documentation

## 2015-07-15 DIAGNOSIS — Y9289 Other specified places as the place of occurrence of the external cause: Secondary | ICD-10-CM | POA: Insufficient documentation

## 2015-07-15 DIAGNOSIS — Y998 Other external cause status: Secondary | ICD-10-CM | POA: Insufficient documentation

## 2015-07-15 DIAGNOSIS — Z79899 Other long term (current) drug therapy: Secondary | ICD-10-CM | POA: Insufficient documentation

## 2015-07-15 DIAGNOSIS — S63501A Unspecified sprain of right wrist, initial encounter: Secondary | ICD-10-CM | POA: Insufficient documentation

## 2015-07-15 DIAGNOSIS — Y9389 Activity, other specified: Secondary | ICD-10-CM | POA: Insufficient documentation

## 2015-07-15 DIAGNOSIS — I10 Essential (primary) hypertension: Secondary | ICD-10-CM | POA: Insufficient documentation

## 2015-07-15 DIAGNOSIS — E079 Disorder of thyroid, unspecified: Secondary | ICD-10-CM | POA: Insufficient documentation

## 2015-07-15 DIAGNOSIS — Z87891 Personal history of nicotine dependence: Secondary | ICD-10-CM | POA: Insufficient documentation

## 2015-07-15 MED ORDER — HYDROCODONE-ACETAMINOPHEN 5-325 MG PO TABS: 1.0000 | ORAL_TABLET | Freq: Four times a day (QID) | ORAL | Status: DC | PRN

## 2015-07-15 NOTE — Discharge Instructions (Signed)
Cryotherapy °Cryotherapy means treatment with cold. Ice or gel packs can be used to reduce both pain and swelling. Ice is the most helpful within the first 24 to 48 hours after an injury or flare-up from overusing a muscle or joint. Sprains, strains, spasms, burning pain, shooting pain, and aches can all be eased with ice. Ice can also be used when recovering from surgery. Ice is effective, has very few side effects, and is safe for most people to use. °PRECAUTIONS  °Ice is not a safe treatment option for people with: °· Raynaud phenomenon. This is a condition affecting small blood vessels in the extremities. Exposure to cold may cause your problems to return. °· Cold hypersensitivity. There are many forms of cold hypersensitivity, including: °¨ Cold urticaria. Red, itchy hives appear on the skin when the tissues begin to warm after being iced. °¨ Cold erythema. This is a red, itchy rash caused by exposure to cold. °¨ Cold hemoglobinuria. Red blood cells break down when the tissues begin to warm after being iced. The hemoglobin that carry oxygen are passed into the urine because they cannot combine with blood proteins fast enough. °· Numbness or altered sensitivity in the area being iced. °If you have any of the following conditions, do not use ice until you have discussed cryotherapy with your caregiver: °· Heart conditions, such as arrhythmia, angina, or chronic heart disease. °· High blood pressure. °· Healing wounds or open skin in the area being iced. °· Current infections. °· Rheumatoid arthritis. °· Poor circulation. °· Diabetes. °Ice slows the blood flow in the region it is applied. This is beneficial when trying to stop inflamed tissues from spreading irritating chemicals to surrounding tissues. However, if you expose your skin to cold temperatures for too long or without the proper protection, you can damage your skin or nerves. Watch for signs of skin damage due to cold. °HOME CARE INSTRUCTIONS °Follow  these tips to use ice and cold packs safely. °· Place a dry or damp towel between the ice and skin. A damp towel will cool the skin more quickly, so you may need to shorten the time that the ice is used. °· For a more rapid response, add gentle compression to the ice. °· Ice for no more than 10 to 20 minutes at a time. The bonier the area you are icing, the less time it will take to get the benefits of ice. °· Check your skin after 5 minutes to make sure there are no signs of a poor response to cold or skin damage. °· Rest 20 minutes or more between uses. °· Once your skin is numb, you can end your treatment. You can test numbness by very lightly touching your skin. The touch should be so light that you do not see the skin dimple from the pressure of your fingertip. When using ice, most people will feel these normal sensations in this order: cold, burning, aching, and numbness. °· Do not use ice on someone who cannot communicate their responses to pain, such as small children or people with dementia. °HOW TO MAKE AN ICE PACK °Ice packs are the most common way to use ice therapy. Other methods include ice massage, ice baths, and cryosprays. Muscle creams that cause a cold, tingly feeling do not offer the same benefits that ice offers and should not be used as a substitute unless recommended by your caregiver. °To make an ice pack, do one of the following: °· Place crushed ice or a   bag of frozen vegetables in a sealable plastic bag. Squeeze out the excess air. Place this bag inside another plastic bag. Slide the bag into a pillowcase or place a damp towel between your skin and the bag.  Mix 3 parts water with 1 part rubbing alcohol. Freeze the mixture in a sealable plastic bag. When you remove the mixture from the freezer, it will be slushy. Squeeze out the excess air. Place this bag inside another plastic bag. Slide the bag into a pillowcase or place a damp towel between your skin and the bag. SEEK MEDICAL CARE  IF:  You develop white spots on your skin. This may give the skin a blotchy (mottled) appearance.  Your skin turns blue or pale.  Your skin becomes waxy or hard.  Your swelling gets worse. MAKE SURE YOU:   Understand these instructions.  Will watch your condition.  Will get help right away if you are not doing well or get worse. Document Released: 07/15/2011 Document Revised: 04/04/2014 Document Reviewed: 07/15/2011 [facility] Patient Information 2015 [place], [place]. This information is not intended to replace advice given to you by your health care provider. Make sure you discuss any questions you have with your health care provider. Your x-ray shows that you do not have any fractures.  You've been placed in a splint for support given a prescription for pain medication and a referral to the orthopedic hand surgeon if needed

## 2015-07-15 NOTE — ED Notes (Signed)
Pt complains of 10/10 pain in her right wrist after falling today at 5pm. Pt states the pain starts in her right wrist and radiates up her arm to her shoulder.

## 2015-07-15 NOTE — ED Provider Notes (Signed)
CSN: [id number]     Arrival date & time 07/15/15  2011 History  This chart was scribed for non-physician practitioner Earley Favor, NP, working with Mancel Bale, MD, by Tanda Rockers, ED Scribe. This patient was seen in room WTR7/WTR7 and the patient's care was started at 8:38 PM.  Chief Complaint  Patient presents with  . Wrist Pain   The history is provided by the patient. No language interpreter was used.     HPI Comments: Jenna [name] is a 54 y.o. female who presents to the Emergency Department complaining of sudden onset, right hand pain radiating up to right arm s/p falling out of a chair earlier today. Pt states that she tried to catch herself with her hand, causing the pain. Denies LOC. No weakness, numbness, tingling, or any other associated symptoms.   Past Medical History  Diagnosis Date  . Hypertension   . Thyroid disease   . Seizures    Past Surgical History  Procedure Laterality Date  . Total thyroidectomy  2010    primary hyperparathyroidism  . Parathyroidectomy  2010    primary hyperparpthyroidism  . Uterine ablation  2009    menorrhagia and severe anemia   No family history on file. Social History  Substance Use Topics  . Smoking status: Former Smoker    Types: Cigarettes    Quit date: 06/13/2010  . Smokeless tobacco: None  . Alcohol Use: No   OB History    No data available     Review of Systems  Constitutional: Negative for activity change.  HENT: Negative.   Eyes: Negative.   Respiratory: Negative.   Cardiovascular: Negative.   Gastrointestinal: Negative.   Endocrine: Negative.   Genitourinary: Negative.   Musculoskeletal: Positive for joint swelling and arthralgias.  Skin: Negative for wound.  Neurological: Negative for syncope, weakness and numbness.  All other systems reviewed and are negative.   Allergies  Tramadol  Home Medications   Prior to Admission medications   Medication Sig Start Date End Date Taking? Authorizing  Provider  amLODipine (NORVASC) 10 MG tablet Take 1 tablet (10 mg total) by mouth daily. 12/14/14   Leona Singleton, MD  calcitonin, salmon, (MIACALCIN/FORTICAL) 200 UNIT/ACT nasal spray Place 1 spray into alternate nostrils daily. 02/01/15   Leona Singleton, MD  carbamide peroxide (DEBROX) 6.5 % otic solution Place 5 drops into both ears 2 (two) times daily. 12/14/14   Leona Singleton, MD  cyclobenzaprine (FLEXERIL) 5 MG tablet Take 1 tablet (5 mg total) by mouth 3 (three) times daily as needed for muscle spasms. Patient not taking: Reported on 12/03/2014 08/16/14   Leona Singleton, MD  divalproex (DEPAKOTE) 250 MG DR tablet Take 1 tablet (250 mg total) by mouth 2 (two) times daily. 05/10/15   Leona Singleton, MD  fluconazole (DIFLUCAN) 150 MG tablet Take 1 tablet (150 mg total) by mouth once. Take 2nd tablet if symptoms not improved the next day. Patient not taking: Reported on 12/03/2014 07/27/14   Leona Singleton, MD  HYDROcodone-acetaminophen (NORCO/VICODIN) 5-325 MG per tablet Take 1 tablet by mouth every 6 (six) hours as needed. 07/15/15   Earley Favor, NP  levothyroxine (SYNTHROID, LEVOTHROID) 125 MCG tablet Take 125 mcg by mouth daily. Needs MD appointment 08/23/13   Leona Singleton, MD  levothyroxine (SYNTHROID, LEVOTHROID) 125 MCG tablet TAKE ONE TABLET BY MOUTH ONCE DAILY BEFORE BREAKFAST 04/18/15   Leona Singleton, MD  lisinopril (PRINIVIL,ZESTRIL) 5 MG tablet Take 1 tablet (5 mg  total) by mouth daily. 04/18/15   Leona Singleton, MD  naproxen (NAPROSYN) 500 MG tablet Take 1 tablet (500 mg total) by mouth 2 (two) times daily. 06/14/15   Joycie Peek, PA-C   Triage Vitals: BP 148/86 mmHg  Pulse 83  Temp(Src) 98.7 F (37.1 C) (Oral)  Resp 18  SpO2 95%   Physical Exam  Constitutional: She is oriented to person, place, and time. She appears well-developed and well-nourished.  HENT:  Head: Normocephalic and atraumatic.  Eyes: Pupils are equal, round, and  reactive to light.  Neck: Normal range of motion.  Cardiovascular: Normal rate.   Pulmonary/Chest: Effort normal.  Musculoskeletal: She exhibits tenderness. She exhibits no edema.       Right wrist: She exhibits decreased range of motion, tenderness, swelling and deformity. She exhibits no effusion and no crepitus.       Arms: Neurological: She is alert and oriented to person, place, and time.  Skin: Skin is warm.  Psychiatric: She has a normal mood and affect.  Nursing note and vitals reviewed.   ED Course  Procedures (including critical care time)  DIAGNOSTIC STUDIES: Oxygen Saturation is 95% on RA, normal by my interpretation.    COORDINATION OF CARE: 8:40 PM-Discussed treatment plan which includes DG R Hand with pt at bedside and pt agreed to plan.   Labs Review Labs Reviewed - No data to display  Imaging Review Dg Wrist Complete Right  07/15/2015   CLINICAL DATA:  Wrist and hand pain following fall from chair, initial encounter  EXAM: RIGHT WRIST - COMPLETE 3+ VIEW  COMPARISON:  None.  FINDINGS: There is no evidence of fracture or dislocation. There is no evidence of arthropathy or other focal bone abnormality. Soft tissues are unremarkable.  IMPRESSION: No acute abnormality noted.   Electronically Signed   By: Alcide Clever M.D.   On: 07/15/2015 21:05      EKG Interpretation None    x-rays been review.  There is no acute fracture.  Patient has been placed in a splint for comfort given prescription for Vicodin as well as a referral to the orthopedic hand surgeon if needed  MDM   Final diagnoses:  Wrist sprain, right, initial encounter   I personally performed the services described in this documentation, which was scribed in my presence. The recorded information has been reviewed and is accurate.     Earley Favor, NP 07/15/15 1914  Mancel Bale, MD 07/16/15 0001

## 2015-07-23 ENCOUNTER — Emergency Department (HOSPITAL_COMMUNITY)
Admission: EM | Admit: 2015-07-23 | Discharge: 2015-07-23 | Disposition: A | Discharge status: Home / Self Care | Payer: Self-pay | Attending: Emergency Medicine | Admitting: Emergency Medicine

## 2015-07-23 ENCOUNTER — Encounter (HOSPITAL_COMMUNITY): Payer: Self-pay | Admitting: *Deleted

## 2015-07-23 ENCOUNTER — Emergency Department (HOSPITAL_COMMUNITY): Payer: Self-pay

## 2015-07-23 DIAGNOSIS — IMO0001 Reserved for inherently not codable concepts without codable children: Secondary | ICD-10-CM

## 2015-07-23 DIAGNOSIS — I10 Essential (primary) hypertension: Secondary | ICD-10-CM | POA: Insufficient documentation

## 2015-07-23 DIAGNOSIS — Z87891 Personal history of nicotine dependence: Secondary | ICD-10-CM | POA: Insufficient documentation

## 2015-07-23 DIAGNOSIS — E079 Disorder of thyroid, unspecified: Secondary | ICD-10-CM | POA: Insufficient documentation

## 2015-07-23 DIAGNOSIS — S6991XA Unspecified injury of right wrist, hand and finger(s), initial encounter: Secondary | ICD-10-CM | POA: Insufficient documentation

## 2015-07-23 DIAGNOSIS — M25561 Pain in right knee: Secondary | ICD-10-CM

## 2015-07-23 DIAGNOSIS — Y998 Other external cause status: Secondary | ICD-10-CM | POA: Insufficient documentation

## 2015-07-23 DIAGNOSIS — S8991XA Unspecified injury of right lower leg, initial encounter: Secondary | ICD-10-CM | POA: Insufficient documentation

## 2015-07-23 DIAGNOSIS — R030 Elevated blood-pressure reading, without diagnosis of hypertension: Secondary | ICD-10-CM | POA: Insufficient documentation

## 2015-07-23 DIAGNOSIS — W1839XA Other fall on same level, initial encounter: Secondary | ICD-10-CM | POA: Insufficient documentation

## 2015-07-23 DIAGNOSIS — G43909 Migraine, unspecified, not intractable, without status migrainosus: Secondary | ICD-10-CM | POA: Insufficient documentation

## 2015-07-23 DIAGNOSIS — Y9289 Other specified places as the place of occurrence of the external cause: Secondary | ICD-10-CM | POA: Insufficient documentation

## 2015-07-23 DIAGNOSIS — Z79899 Other long term (current) drug therapy: Secondary | ICD-10-CM | POA: Insufficient documentation

## 2015-07-23 DIAGNOSIS — Y9389 Activity, other specified: Secondary | ICD-10-CM | POA: Insufficient documentation

## 2015-07-23 MED ORDER — NAPROXEN 500 MG PO TABS: 500.0000 mg | ORAL_TABLET | Freq: Two times a day (BID) | ORAL | Status: DC

## 2015-07-23 NOTE — ED Notes (Signed)
Pt in c/o right knee pain since a fall last week, ambulatory to room, no distress noted

## 2015-07-23 NOTE — ED Notes (Signed)
Declined W/C at D/C and was escorted to lobby by RN. 

## 2015-07-23 NOTE — ED Provider Notes (Signed)
CSN: [id number]     Arrival date & time 07/23/15  1226 History  This chart was scribed for Renne Crigler, PA-C, working with Pricilla Loveless, MD by Elon Spanner, ED Scribe. This patient was seen in room TR04C/TR04C and the patient's care was started at 1:03 PM.   Chief Complaint  Patient presents with  . Knee Pain    The history is provided by the patient. No language interpreter was used.    HPI Comments: Jenna [name] is a 54 y.o. female who presents to the Emergency Department complaining of constant, moderate right knee pain onset gradually over the past 8 days after she fell backwards, catching herself with her right hand, and twisting her knee.  Patient is able to ambulate.  She was seen in the ED after the fall where she received negative imaging of the right wrist and was discharged home with a wrist splint.  There is some continued pain in the right wrist.     Past Medical History  Diagnosis Date  . Hypertension   . Thyroid disease   . Seizures    Past Surgical History  Procedure Laterality Date  . Total thyroidectomy  2010    primary hyperparathyroidism  . Parathyroidectomy  2010    primary hyperparpthyroidism  . Uterine ablation  2009    menorrhagia and severe anemia   History reviewed. No pertinent family history. Social History  Substance Use Topics  . Smoking status: Former Smoker    Types: Cigarettes    Quit date: 06/13/2010  . Smokeless tobacco: None  . Alcohol Use: No   OB History    No data available     Review of Systems  Constitutional: Negative for fever and activity change.  Musculoskeletal: Positive for arthralgias. Negative for back pain, joint swelling, gait problem and neck pain.  Skin: Negative for wound.  Neurological: Negative for weakness and numbness.      Allergies  Tramadol  Home Medications   Prior to Admission medications   Medication Sig Start Date End Date Taking? Authorizing Provider  amLODipine (NORVASC) 10 MG tablet  Take 1 tablet (10 mg total) by mouth daily. 12/14/14   Leona Singleton, MD  calcitonin, salmon, (MIACALCIN/FORTICAL) 200 UNIT/ACT nasal spray Place 1 spray into alternate nostrils daily. 02/01/15   Leona Singleton, MD  carbamide peroxide (DEBROX) 6.5 % otic solution Place 5 drops into both ears 2 (two) times daily. 12/14/14   Leona Singleton, MD  cyclobenzaprine (FLEXERIL) 5 MG tablet Take 1 tablet (5 mg total) by mouth 3 (three) times daily as needed for muscle spasms. Patient not taking: Reported on 12/03/2014 08/16/14   Leona Singleton, MD  divalproex (DEPAKOTE) 250 MG DR tablet Take 1 tablet (250 mg total) by mouth 2 (two) times daily. 05/10/15   Leona Singleton, MD  fluconazole (DIFLUCAN) 150 MG tablet Take 1 tablet (150 mg total) by mouth once. Take 2nd tablet if symptoms not improved the next day. Patient not taking: Reported on 12/03/2014 07/27/14   Leona Singleton, MD  HYDROcodone-acetaminophen (NORCO/VICODIN) 5-325 MG per tablet Take 1 tablet by mouth every 6 (six) hours as needed. 07/15/15   Earley Favor, NP  levothyroxine (SYNTHROID, LEVOTHROID) 125 MCG tablet Take 125 mcg by mouth daily. Needs MD appointment 08/23/13   Leona Singleton, MD  levothyroxine (SYNTHROID, LEVOTHROID) 125 MCG tablet TAKE ONE TABLET BY MOUTH ONCE DAILY BEFORE BREAKFAST 04/18/15   Leona Singleton, MD  lisinopril (PRINIVIL,ZESTRIL) 5 MG tablet Take  1 tablet (5 mg total) by mouth daily. 04/18/15   Leona Singleton, MD  naproxen (NAPROSYN) 500 MG tablet Take 1 tablet (500 mg total) by mouth 2 (two) times daily. 06/14/15   Benjamin Cartner, PA-C   BP 184/108 mmHg  Pulse 76  Temp(Src) 97.6 F (36.4 C) (Oral)  Resp 20  Ht 5\' 4"  (1.626 m)  Wt 243 lb (110.224 kg)  BMI 41.69 kg/m2  SpO2 100%   Physical Exam  Constitutional: She is oriented to person, place, and time. She appears well-developed and well-nourished. No distress.  HENT:  Head: Normocephalic and atraumatic.  Eyes:  Conjunctivae and EOM are normal. Pupils are equal, round, and reactive to light.  Neck: Normal range of motion. Neck supple. No tracheal deviation present.  Cardiovascular: Normal rate.  Exam reveals no decreased pulses.   Pulses:      Dorsalis pedis pulses are 2+ on the right side.  Pulmonary/Chest: Effort normal. No respiratory distress.  Musculoskeletal: Normal range of motion. She exhibits tenderness. She exhibits no edema.       Right shoulder: Normal.       Right elbow: Normal.      Right wrist: She exhibits tenderness. She exhibits normal range of motion and no bony tenderness.       Right hip: Normal.       Right knee: She exhibits normal range of motion, no swelling and no effusion. Tenderness found. Lateral joint line tenderness noted.       Right ankle: Normal.       Right forearm: Normal.       Right hand: Normal.  Neurological: She is alert and oriented to person, place, and time. No sensory deficit.  Motor, sensation, and vascular distal to the injury is fully intact.   Skin: Skin is warm and dry.  Psychiatric: She has a normal mood and affect. Her behavior is normal.  Nursing note and vitals reviewed.   ED Course  Procedures (including critical care time)  DIAGNOSTIC STUDIES: Oxygen Saturation is 100% on RA, normal by my interpretation.    COORDINATION OF CARE:  1:15 PM Discussed plans to obtain imaging of the knee.  Patient acknowledges and agrees with plan.    Labs Review Labs Reviewed - No data to display  Imaging Review Dg Knee Complete 4 Views Right  07/23/2015   CLINICAL DATA:  Fall 8 days ago. Right knee injury and pain. Initial encounter.  EXAM: RIGHT KNEE - COMPLETE 4+ VIEW  COMPARISON:  03/17/2015  FINDINGS: There is no evidence of fracture, dislocation, or joint effusion. Mild medial joint space narrowing noted, as well as early degenerative spurring of the patella. No other significant bone abnormality identified.  IMPRESSION: No acute findings.    Electronically Signed   By: Myles Rosenthal M.D.   On: 07/23/2015 14:49   I have personally reviewed and evaluated these images and lab results as part of my medical decision-making.   EKG Interpretation None      Vital signs reviewed and are as follows: Filed Vitals:   07/23/15 1528  BP: 174/104  Pulse: 75  Temp: 97.6 F (36.4 C)  Resp: 18   Patient provided with knee sleeve. She has to take NSAIDs at home for pain. Blood pressure is elevated in emergency department, however patient notes that her husband is currently in the hospital and she has not been home today to take her blood pressure medications.  Patient was counseled on RICE protocol and told to  rest injury, use ice for no longer than 15 minutes every hour, compress the area, and elevate above the level of their heart as much as possible to reduce swelling. Questions answered. Patient verbalized understanding.    Encouraged PCP follow-up if not improved in one week.   MDM   Final diagnoses:  Right knee pain  Elevated blood pressure   Patient with right knee pain after a fall. Patient was initially seen after this fall with wrist pain, negative imaging, sent home with splint. She continues to have some mild wrist pain but also has knee pain. X-ray performed and is negative. Suspect strain/sprain. Will treat conservatively with rice protocol, compression.  I personally performed the services described in this documentation, which was scribed in my presence. The recorded information has been reviewed and is accurate.    Renne Crigler, PA-C 07/23/15 1557  Pricilla Loveless, MD 07/31/15 [phone]

## 2015-07-23 NOTE — Discharge Instructions (Signed)
Please read and follow all provided instructions.  Your diagnoses today include:  1. Right knee pain   2. Elevated blood pressure     Tests performed today include:  An x-ray of the affected area - does NOT show any broken bones  Vital signs. See below for your results today.   Medications prescribed:   Naproxen - anti-inflammatory pain medication  Do not exceed  naproxen every 12 hours, take with food  You have been prescribed an anti-inflammatory medication or NSAID. Take with food. Take smallest effective dose for the shortest duration needed for your pain. Stop taking if you experience stomach pain or vomiting.   Take any prescribed medications only as directed.  Home care instructions:   Follow any educational materials contained in this packet  Follow R.I.C.E. Protocol:  R - rest your injury   I  - use ice on injury without applying directly to skin  C - compress injury with bandage or splint  E - elevate the injury as much as possible  Follow-up instructions: Please follow-up with your primary care provider if you continue to have significant pain in 1 week. In this case you may have a more severe injury that requires further care.   Return instructions:   Please return if your toes or feet are numb or tingling, appear gray or blue, or you have severe pain (also elevate the leg and loosen splint or wrap if you were given one)  Please return to the Emergency Department if you experience worsening symptoms.   Please return if you have any other emergent concerns.  Additional Information:  Your vital signs today were: BP 184/108 mmHg   Pulse 76   Temp(Src) 97.6 F (36.4 C) (Oral)   Resp 20   Ht  (1.626 m)   Wt 243 lb (110.224 kg)   BMI 41.69 kg/m2   SpO2 100% If your blood pressure (BP) was elevated above 135/85 this visit, please have this repeated by your doctor within one month. --------------

## 2015-08-25 ENCOUNTER — Ambulatory Visit (INDEPENDENT_AMBULATORY_CARE_PROVIDER_SITE_OTHER): Payer: Self-pay | Admitting: Family Medicine

## 2015-08-25 VITALS — BP 167/97 | HR 70 | Temp 98.7°F | Wt 246.0 lb

## 2015-08-25 DIAGNOSIS — R1031 Right lower quadrant pain: Secondary | ICD-10-CM

## 2015-08-25 NOTE — Progress Notes (Signed)
    Subjective   Jenna [name] is a 54 y.o. female that presents for a same day visit  1. Right lower abdominal pain: Symptoms started two days ago. Pain described as sharp. Sometimes it radiates to her left side. Pain usually occurs when walking or sitting down. She has not taken any medication. She has not found anything that causes the pain. No fevers, nausea or vomiting. She has had some lightheadedness. She had an episode of blood on her stool and tissue. She has a history of hemorrhoids. No vaginal bleeding or discharge.  ROS Per HPI  Social History  Substance Use Topics  . Smoking status: Former Smoker    Types: Cigarettes    Quit date: 06/13/2010  . Smokeless tobacco: Not on file  . Alcohol Use: No    Allergies  Allergen Reactions  . Tramadol     "triggers seizures"    Objective   BP 167/97 mmHg  Pulse 70  Temp(Src) 98.7 F (37.1 C) (Oral)  Wt 246 lb (111.585 kg)  General: Well appearing, no distress Gastrointestinal: Mild LLQ and RLQ tenderness. No rebound or guarding. Bowel sounds normal. Genitourinary: No CMT, however, does have some discomfort. Difficult to feel uterus/ovaries secondary to body habitus. GC/Chlamydia declined by patient Assessment and Plan   No orders of the defined types were placed in this encounter.    Abdominal pain: patient concerned about appendicitis. Does not fit picture. Patient also mentioned having to lift her husband more than previous which may be straining her muscles. This is possible as well. Pelvic exam does not make me highly suspicious for PID, although patient is declining GC/Chlamydia testing.  Reassurance regarding concern for appendicitis  Return precautions discussed  Heating pad if she is sore to see if it helps.

## 2015-08-25 NOTE — Patient Instructions (Signed)
Thank you for coming to see me today. It was a pleasure. Today we talked about:   Abdominal pain: this may be related to your muscle or your ovary. Either way, your symptoms may improve on their own. You can take over the counter Ibuprofen as needed. You can also use a heating pad in the area if it helps  Please make an appointment to see Gayla Doss for follow-up.  If you have any questions or concerns, please do not hesitate to call the office at [phone].  Sincerely,  Jacquelin Hawking, MD

## 2015-08-29 ENCOUNTER — Other Ambulatory Visit: Payer: Self-pay | Admitting: Family Medicine

## 2015-08-30 NOTE — Telephone Encounter (Signed)
Pt called and needs a refill on her Divalproex called in. jw

## 2015-08-30 NOTE — Telephone Encounter (Signed)
Refill request from pt. Will forward to PCP for review. Adams,Latoya, CMA. 

## 2015-08-31 NOTE — Telephone Encounter (Signed)
Please call and have her make appointment to discuss seizures and Depakote prior to needing next fill. 1 month supple given today.

## 2015-08-31 NOTE — Telephone Encounter (Signed)
Patient has an appt on 09/28/2015. Jazmin Hartsell,CMA  

## 2015-09-28 ENCOUNTER — Encounter: Payer: Self-pay | Admitting: Family Medicine

## 2015-09-28 ENCOUNTER — Ambulatory Visit (INDEPENDENT_AMBULATORY_CARE_PROVIDER_SITE_OTHER): Payer: Self-pay | Admitting: Family Medicine

## 2015-09-28 VITALS — BP 149/99 | HR 83 | Temp 98.6°F | Ht 66.5 in | Wt 247.0 lb

## 2015-09-28 DIAGNOSIS — R1013 Epigastric pain: Secondary | ICD-10-CM

## 2015-10-06 NOTE — Progress Notes (Signed)
Appointment canceled as pain had resolved

## 2015-10-12 ENCOUNTER — Other Ambulatory Visit: Payer: Self-pay | Admitting: Family Medicine

## 2015-10-24 ENCOUNTER — Encounter: Payer: Self-pay | Admitting: Family Medicine

## 2015-12-14 ENCOUNTER — Ambulatory Visit (INDEPENDENT_AMBULATORY_CARE_PROVIDER_SITE_OTHER): Payer: BLUE CROSS/BLUE SHIELD | Admitting: Internal Medicine

## 2015-12-14 ENCOUNTER — Encounter: Payer: Self-pay | Admitting: Internal Medicine

## 2015-12-14 VITALS — BP 142/78 | HR 90 | Temp 98.8°F

## 2015-12-14 DIAGNOSIS — L298 Other pruritus: Secondary | ICD-10-CM

## 2015-12-14 DIAGNOSIS — M25511 Pain in right shoulder: Secondary | ICD-10-CM

## 2015-12-14 DIAGNOSIS — N898 Other specified noninflammatory disorders of vagina: Secondary | ICD-10-CM

## 2015-12-14 LAB — POCT WET PREP (WET MOUNT): Clue Cells Wet Prep Whiff POC: NEGATIVE

## 2015-12-14 MED ORDER — FLUCONAZOLE 150 MG PO TABS: 150.0000 mg | ORAL_TABLET | Freq: Once | ORAL | Status: DC

## 2015-12-14 MED ORDER — DICLOFENAC SODIUM 75 MG PO TBEC: 75.0000 mg | DELAYED_RELEASE_TABLET | Freq: Two times a day (BID) | ORAL | Status: DC

## 2015-12-14 MED ORDER — CYCLOBENZAPRINE HCL 10 MG PO TABS
10.0000 mg | ORAL_TABLET | Freq: Three times a day (TID) | ORAL | Status: DC | PRN
Start: 2015-12-14 — End: 2015-12-17

## 2015-12-14 NOTE — Progress Notes (Signed)
Subjective:    Jenna [name] - 54 y.o. female MRN [id number]  Date of birth: 1961-05-08  HPI  Jenna [name] is here for shoulder pain and vaginal pruritis.  Right Shoulder Pain:  -started yesterday and has worsened today -reports moving furniture 2 days ago  -no fevers, nausea/vomiting, swelling, or redness of joint reported  -limiting her range of motion  -did not feel any type of pop or tearing pain will moving the furniture -denies any recent trauma to the shoulder   Vaginal Pruritis:  -for one week duration -itching on outside and inside of labia majora  -went to [place] last week and reports swimming, staying in wet bathing suits for extended period of time  -denies new discharge or vaginal bleeding  -denies change in detergents, clothing, body wash, or new douching  -declines STD testing  -has been applying Vaseline without relief    -  reports that she quit smoking about 5 years ago. Her smoking use included Cigarettes. She does not have any smokeless tobacco history on file. - Review of Systems: Per HPI. - Past Medical History: Patient Active Problem List   Diagnosis Date Noted  . Shoulder pain, right 12/14/2015  . Vaginal itching 12/14/2015  . Pain of right thumb 05/10/2015  . Abdominal pain, epigastric 05/10/2015  . Vertebral compression fracture (HCC) 12/18/2014  . Excessive cerumen in right ear canal 12/14/2014  . Knee pain 08/16/2014  . Breast pain, left 07/28/2014  . LLQ abdominal pain 07/28/2014  . Neck pain on right side 05/17/2014  . Intertrigo 11/03/2013  . Right hand pain 10/20/2012  . Health care maintenance 10/20/2012  . Seizure (HCC) 04/07/2012  . Carpal tunnel syndrome on both sides 12/06/2011  . Plantar fasciitis 08/30/2011  . DeQuervain's disease (tenosynovitis) 04/22/2011  . Shoulder pain, left 04/19/2011  . Depressive illness 01/22/2011  . ARTHRALGIA 12/28/2010  . 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 Primary  hyperparathyroidism (HCC) 05/25/2010  . HYPERTENSION, BENIGN 01/06/2009  . OBESITY, NOS 01/29/2007   - Medications: reviewed and updated Current Outpatient Prescriptions  Medication Sig Dispense Refill  . amLODipine (NORVASC) 10 MG tablet Take 1 tablet (10 mg total) by mouth daily. 90 tablet 1  . calcitonin, salmon, (MIACALCIN/FORTICAL) 200 UNIT/ACT nasal spray Place 1 spray into alternate nostrils daily. 3.7 mL 12  . carbamide peroxide (DEBROX) 6.5 % otic solution Place 5 drops into both ears 2 (two) times daily. 15 mL 0  . cyclobenzaprine (FLEXERIL) 10 MG tablet Take 1 tablet (10 mg total) by mouth 3 (three) times daily as needed for muscle spasms. 30 tablet 0  . diclofenac (VOLTAREN) 75 MG EC tablet Take 1 tablet (75 mg total) by mouth 2 (two) times daily. 30 tablet 0  . divalproex (DEPAKOTE) 250 MG DR tablet Take 1 tablet (250 mg total) by mouth 2 (two) times daily. 180 tablet 0  . fluconazole (DIFLUCAN) 150 MG tablet Take 1 tablet (150 mg total) by mouth once. Take second tablet if symptoms not resolved 3 days later. 2 tablet 0  . HYDROcodone-acetaminophen (NORCO/VICODIN) 5-325 MG per tablet Take 1 tablet by mouth every 6 (six) hours as needed. 10 tablet 0  . levothyroxine (SYNTHROID, LEVOTHROID) 125 MCG tablet Take 125 mcg by mouth daily. Needs MD appointment    . levothyroxine (SYNTHROID, LEVOTHROID) 125 MCG tablet TAKE ONE TABLET BY MOUTH ONCE DAILY BEFORE BREAKFAST 60 tablet 2  . lisinopril (PRINIVIL,ZESTRIL) 5 MG tablet Take 1 tablet (5 mg total) by mouth daily. 30 tablet 5  .  naproxen (NAPROSYN) 500 MG tablet Take 1 tablet (500 mg total) by mouth 2 (two) times daily. 15 tablet 0   No current facility-administered medications for this visit.    Objective:   Physical Exam BP 142/78 mmHg  Pulse 90  Temp(Src) 98.8 F (37.1 C) (Oral)  SpO2 93% Gen: NAD, alert, cooperative with exam, well-appearing CV: RRR, good S1/S2, no murmur, radial pulses intact  Shoulders: Right shoulder  passive and active ROM diminished in adduction. Strength 4/5 in R UE. Distal strength 5/5 in R UE. Positive empty can test. No erythema, edema, or increased warmth in the R shoulder. Pain to palpation of lateral deltoid.  GU: External genitalia normal without excoriations, lesions, or rash noted. No erythema of the external genitalia. Wet prep performed. Mild amounts of white discharge noted.      Assessment & Plan:   Shoulder pain, right Given history suspect related to a deltoid muscle strain or possible rotator cuff tendinopathy. No red flags for a septic joint. Not concerned for adhesive given short time course from initial pain to presentation at clinic. Rx given for Flexeril and Diclofenac. Recommended ice for the next 48 hours followed by heat. Gentle shoulder exercises demonstrated to patient. Instructed patient to call clinic in 2 weeks if symptoms not improving at all. Would then recommend ordering shoulder Xray and further work up from there.   Vaginal itching Moderate yeast seen on the wet prep. Rx for Diflucan given. Instructed patient to take second tablet if symptoms not improved in 72 hours. Also recommended OTC yeast creams for more immediate relief today. Instructed patient to wear loose clothing and cotton underwear. Avoid prolonged wearing of wet swimsuits in the future.      Jenna [name], D.O. 12/14/2015, 5:13 PM PGY-1, [facility]

## 2015-12-14 NOTE — Patient Instructions (Signed)
Thank you for coming to see me today. It was a pleasure. Today we talked about:   Shoulder Pain: Take Flexeril and Diclofenac for your pain. Apply ice for the next 48 hours for 20 minutes every 1-2 hours while awake. After 48 hours switch to heat. Don't lift anything heavy. Do the gentle exercises we discussed. If you start having fevers or the shoulder becomes red, swollen, and hot please come back immediately. If the pain is not improved in 2 weeks call the clinic. We will order an X-ray at that time.   Vaginal Itching: Take the Diflucan for your yeast infection. You can also ask the pharmacy about over the counter topical yeast creams that may help with itching as well. Wear loose clothing and cotton underwear.   Please follow-up with Dr. Gayla DossJoyner as needed.   If you have any questions or concerns, please do not hesitate to call the office at [phone].  Take Care,   Jenna Sirenatherine Chera Slivka, DO

## 2015-12-14 NOTE — Assessment & Plan Note (Signed)
Given history suspect related to a deltoid muscle strain or possible rotator cuff tendinopathy. No red flags for a septic joint. Not concerned for adhesive given short time course from initial pain to presentation at clinic. Rx given for Flexeril and Diclofenac. Recommended ice for the next 48 hours followed by heat. Gentle shoulder exercises demonstrated to patient. Instructed patient to call clinic in 2 weeks if symptoms not improving at all. Would then recommend ordering shoulder Xray and further work up from there.

## 2015-12-14 NOTE — Assessment & Plan Note (Addendum)
Moderate yeast seen on the wet prep. Rx for Diflucan given. Instructed patient to take second tablet if symptoms not improved in 72 hours. Also recommended OTC yeast creams for more immediate relief today. Instructed patient to wear loose clothing and cotton underwear. Avoid prolonged wearing of wet swimsuits in the future.

## 2015-12-17 ENCOUNTER — Encounter (HOSPITAL_COMMUNITY): Payer: Self-pay | Admitting: Emergency Medicine

## 2015-12-17 ENCOUNTER — Observation Stay (HOSPITAL_COMMUNITY)
Admission: EM | Admit: 2015-12-17 | Discharge: 2015-12-18 | Disposition: A | Discharge status: Home / Self Care | Payer: BLUE CROSS/BLUE SHIELD | Attending: Family Medicine | Admitting: Family Medicine

## 2015-12-17 ENCOUNTER — Other Ambulatory Visit: Payer: Self-pay

## 2015-12-17 ENCOUNTER — Emergency Department (HOSPITAL_COMMUNITY): Payer: BLUE CROSS/BLUE SHIELD

## 2015-12-17 DIAGNOSIS — R079 Chest pain, unspecified: Secondary | ICD-10-CM | POA: Diagnosis not present

## 2015-12-17 DIAGNOSIS — E669 Obesity, unspecified: Secondary | ICD-10-CM | POA: Diagnosis present

## 2015-12-17 DIAGNOSIS — R51 Headache: Secondary | ICD-10-CM | POA: Insufficient documentation

## 2015-12-17 DIAGNOSIS — R569 Unspecified convulsions: Secondary | ICD-10-CM | POA: Insufficient documentation

## 2015-12-17 DIAGNOSIS — S46011A Strain of muscle(s) and tendon(s) of the rotator cuff of right shoulder, initial encounter: Secondary | ICD-10-CM | POA: Insufficient documentation

## 2015-12-17 DIAGNOSIS — M25512 Pain in left shoulder: Secondary | ICD-10-CM

## 2015-12-17 DIAGNOSIS — I10 Essential (primary) hypertension: Secondary | ICD-10-CM | POA: Diagnosis not present

## 2015-12-17 DIAGNOSIS — R55 Syncope and collapse: Secondary | ICD-10-CM | POA: Diagnosis not present

## 2015-12-17 DIAGNOSIS — Z6841 Body Mass Index (BMI) 40.0 and over, adult: Secondary | ICD-10-CM | POA: Insufficient documentation

## 2015-12-17 DIAGNOSIS — H538 Other visual disturbances: Secondary | ICD-10-CM | POA: Diagnosis not present

## 2015-12-17 DIAGNOSIS — E210 Primary hyperparathyroidism: Secondary | ICD-10-CM | POA: Diagnosis present

## 2015-12-17 DIAGNOSIS — E890 Postprocedural hypothyroidism: Secondary | ICD-10-CM | POA: Diagnosis not present

## 2015-12-17 DIAGNOSIS — Z7984 Long term (current) use of oral hypoglycemic drugs: Secondary | ICD-10-CM | POA: Diagnosis not present

## 2015-12-17 DIAGNOSIS — Z885 Allergy status to narcotic agent status: Secondary | ICD-10-CM | POA: Diagnosis not present

## 2015-12-17 DIAGNOSIS — Y9289 Other specified places as the place of occurrence of the external cause: Secondary | ICD-10-CM | POA: Diagnosis not present

## 2015-12-17 DIAGNOSIS — Y9389 Activity, other specified: Secondary | ICD-10-CM | POA: Insufficient documentation

## 2015-12-17 DIAGNOSIS — E1165 Type 2 diabetes mellitus with hyperglycemia: Secondary | ICD-10-CM | POA: Diagnosis not present

## 2015-12-17 DIAGNOSIS — Y998 Other external cause status: Secondary | ICD-10-CM | POA: Diagnosis not present

## 2015-12-17 DIAGNOSIS — X500XXA Overexertion from strenuous movement or load, initial encounter: Secondary | ICD-10-CM | POA: Insufficient documentation

## 2015-12-17 DIAGNOSIS — E119 Type 2 diabetes mellitus without complications: Secondary | ICD-10-CM | POA: Insufficient documentation

## 2015-12-17 DIAGNOSIS — Z87891 Personal history of nicotine dependence: Secondary | ICD-10-CM | POA: Insufficient documentation

## 2015-12-17 LAB — URINALYSIS, ROUTINE W REFLEX MICROSCOPIC
BILIRUBIN URINE: NEGATIVE
HGB URINE DIPSTICK: NEGATIVE
KETONES UR: NEGATIVE mg/dL
Leukocytes, UA: NEGATIVE
Nitrite: NEGATIVE
PROTEIN: NEGATIVE mg/dL
Specific Gravity, Urine: 1.036 — ABNORMAL HIGH (ref 1.005–1.030)
pH: 5.5 (ref 5.0–8.0)

## 2015-12-17 LAB — BASIC METABOLIC PANEL
ANION GAP: 12 (ref 5–15)
BUN: 7 mg/dL (ref 6–20)
CALCIUM: 9.4 mg/dL (ref 8.9–10.3)
CO2: 27 mmol/L (ref 22–32)
CREATININE: 1.11 mg/dL — AB (ref 0.44–1.00)
Chloride: 100 mmol/L — ABNORMAL LOW (ref 101–111)
GFR calc non Af Amer: 56 mL/min — ABNORMAL LOW (ref >60)
Glucose, Bld: 362 mg/dL — ABNORMAL HIGH (ref 65–99)
Potassium: 3.6 mmol/L (ref 3.5–5.1)
SODIUM: 139 mmol/L (ref 135–145)

## 2015-12-17 LAB — CBG MONITORING, ED: Glucose-Capillary: 336 mg/dL — ABNORMAL HIGH (ref 65–99)

## 2015-12-17 LAB — CBC
HCT: 43.8 % (ref 36.0–46.0)
HEMOGLOBIN: 14.8 g/dL (ref 12.0–15.0)
MCH: 27.3 pg (ref 26.0–34.0)
MCHC: 33.8 g/dL (ref 30.0–36.0)
MCV: 80.7 fL (ref 78.0–100.0)
PLATELETS: 195 10*3/uL (ref 150–400)
RBC: 5.43 MIL/uL — AB (ref 3.87–5.11)
RDW: 15.2 % (ref 11.5–15.5)
WBC: 6 10*3/uL (ref 4.0–10.5)

## 2015-12-17 LAB — URINE MICROSCOPIC-ADD ON

## 2015-12-17 LAB — HEPATIC FUNCTION PANEL
ALK PHOS: 61 U/L (ref 38–126)
ALT: 16 U/L (ref 14–54)
AST: 21 U/L (ref 15–41)
Albumin: 3.7 g/dL (ref 3.5–5.0)
BILIRUBIN DIRECT: 0.3 mg/dL (ref 0.1–0.5)
BILIRUBIN INDIRECT: 0.2 mg/dL — AB (ref 0.3–0.9)
BILIRUBIN TOTAL: 0.5 mg/dL (ref 0.3–1.2)
TOTAL PROTEIN: 7.1 g/dL (ref 6.5–8.1)

## 2015-12-17 LAB — GLUCOSE, CAPILLARY: Glucose-Capillary: 260 mg/dL — ABNORMAL HIGH (ref 65–99)

## 2015-12-17 LAB — TSH: TSH: 52.877 u[IU]/mL — AB (ref 0.350–4.500)

## 2015-12-17 LAB — TROPONIN I: Troponin I: <0.03 ng/mL (ref <0.031)

## 2015-12-17 LAB — I-STAT TROPONIN, ED: Troponin i, poc: 0 ng/mL (ref 0.00–0.08)

## 2015-12-17 LAB — VALPROIC ACID LEVEL: Valproic Acid Lvl: 27 ug/mL — ABNORMAL LOW (ref 50.0–100.0)

## 2015-12-17 MED ORDER — LISINOPRIL 5 MG PO TABS
5.0000 mg | ORAL_TABLET | Freq: Every day | ORAL | Status: DC
  Administered 2015-12-18: 5 mg via ORAL
  Filled 2015-12-17: qty 1

## 2015-12-17 MED ORDER — SODIUM CHLORIDE 0.9 % IJ SOLN
3.0000 mL | Freq: Two times a day (BID) | INTRAMUSCULAR | Status: DC
  Administered 2015-12-17 – 2015-12-18 (×2): 3 mL via INTRAVENOUS

## 2015-12-17 MED ORDER — DIVALPROEX SODIUM 250 MG PO DR TAB
250.0000 mg | DELAYED_RELEASE_TABLET | Freq: Two times a day (BID) | ORAL | Status: DC
  Administered 2015-12-17 – 2015-12-18 (×2): 250 mg via ORAL
  Filled 2015-12-17 (×2): qty 1

## 2015-12-17 MED ORDER — HEPARIN SODIUM (PORCINE) 5000 UNIT/ML IJ SOLN
5000.0000 [IU] | Freq: Three times a day (TID) | INTRAMUSCULAR | Status: DC
  Filled 2015-12-17: qty 1

## 2015-12-17 MED ORDER — SODIUM CHLORIDE 0.9 % IV BOLUS (SEPSIS)
1000.0000 mL | Freq: Once | INTRAVENOUS | Status: AC
  Administered 2015-12-17: 1000 mL via INTRAVENOUS

## 2015-12-17 MED ORDER — LEVOTHYROXINE SODIUM 75 MCG PO TABS
175.0000 ug | ORAL_TABLET | Freq: Every day | ORAL | Status: DC
  Administered 2015-12-18: 175 ug via ORAL
  Filled 2015-12-17: qty 1

## 2015-12-17 MED ORDER — ACETAMINOPHEN 325 MG PO TABS
650.0000 mg | ORAL_TABLET | Freq: Four times a day (QID) | ORAL | Status: DC | PRN
  Administered 2015-12-17: 650 mg via ORAL
  Filled 2015-12-17: qty 2

## 2015-12-17 MED ORDER — AMLODIPINE BESYLATE 10 MG PO TABS
10.0000 mg | ORAL_TABLET | Freq: Every day | ORAL | Status: DC
  Administered 2015-12-18: 10 mg via ORAL
  Filled 2015-12-17: qty 1

## 2015-12-17 MED ORDER — ASPIRIN EC 81 MG PO TBEC
81.0000 mg | DELAYED_RELEASE_TABLET | Freq: Every day | ORAL | Status: DC
  Administered 2015-12-17 – 2015-12-18 (×2): 81 mg via ORAL
  Filled 2015-12-17 (×2): qty 1

## 2015-12-17 MED ORDER — LEVOTHYROXINE SODIUM 25 MCG PO TABS: 125.0000 ug | ORAL_TABLET | Freq: Every day | ORAL | Status: DC

## 2015-12-17 NOTE — ED Notes (Signed)
Admitting at bedside 

## 2015-12-17 NOTE — ED Provider Notes (Signed)
CSN: [id number]     Arrival date & time 12/17/15  1348 History   First MD Initiated Contact with Patient 12/17/15 1456     Chief Complaint  Patient presents with  . Dizziness  . Headache  . Arm Pain     (Consider location/radiation/quality/duration/timing/severity/associated sxs/prior Treatment) Patient is a 55 y.o. female presenting with dizziness, headaches, and arm pain.  Dizziness Associated symptoms: headaches   Headache Associated symptoms: dizziness and myalgias   Arm Pain Associated symptoms include headaches.   She complains of lightheadedness . She has had 2 near syncopal events in the past 2 days. She's been treated with diclofenac and cyclobenzaprine prescribed 12/14/2015 for right arm pain. She feels she injured her right arm while moving furniture a few days ago. She stopped those 2 medicines but had another near syncopal event today. She becomes lightheaded with worse with standing. She denies headache. No other associated symptoms. Right arm pain is worse with moving her arm improved with remaining still. She denies chest pain denies shortness of breath. Denies headache  Past Medical History  Diagnosis Date  . Hypertension   . Thyroid disease   . Seizures [facility])    Past Surgical History  Procedure Laterality Date  . Total thyroidectomy  2010    primary hyperparathyroidism  . Parathyroidectomy  2010    primary hyperparpthyroidism  . Uterine ablation  2009    menorrhagia and severe anemia   No family history on file. Social History  Substance Use Topics  . Smoking status: Former Smoker    Types: Cigarettes    Quit date: 06/13/2010  . Smokeless tobacco: None  . Alcohol Use: No   ex-smoker quit 6 years ago no illicit drug use OB History    No data available     Review of Systems  Musculoskeletal: Positive for myalgias.       Right arm pain  Neurological: Positive for dizziness, light-headedness and headaches.      Allergies  Tramadol  Home  Medications   Prior to Admission medications   Medication Sig Start Date End Date Taking? Authorizing Provider  amLODipine (NORVASC) 10 MG tablet Take 1 tablet (10 mg total) by mouth daily. 12/14/14  Yes Leona Singleton, MD  cyclobenzaprine (FLEXERIL) 10 MG tablet Take 1 tablet (10 mg total) by mouth 3 (three) times daily as needed for muscle spasms. 12/14/15  Yes Arvilla Market, DO  diclofenac (VOLTAREN) 75 MG EC tablet Take 1 tablet (75 mg total) by mouth 2 (two) times daily. 12/14/15  Yes Arvilla Market, DO  divalproex (DEPAKOTE) 250 MG DR tablet Take 1 tablet (250 mg total) by mouth 2 (two) times daily. 10/12/15  Yes Jamal Collin, MD  levothyroxine (SYNTHROID, LEVOTHROID) 125 MCG tablet TAKE ONE TABLET BY MOUTH ONCE DAILY BEFORE BREAKFAST 04/18/15  Yes Leona Singleton, MD  lisinopril (PRINIVIL,ZESTRIL) 5 MG tablet Take 1 tablet (5 mg total) by mouth daily. 04/18/15  Yes Leona Singleton, MD  calcitonin, salmon, (MIACALCIN/FORTICAL) 200 UNIT/ACT nasal spray Place 1 spray into alternate nostrils daily. Patient not taking: Reported on 12/17/2015 02/01/15   Leona Singleton, MD  carbamide peroxide [facility]) 6.5 % otic solution Place 5 drops into both ears 2 (two) times daily. Patient not taking: Reported on 12/17/2015 12/14/14   Leona Singleton, MD  fluconazole (DIFLUCAN) 150 MG tablet Take 1 tablet (150 mg total) by mouth once. Take second tablet if symptoms not resolved 3 days later. Patient not taking: Reported on  12/17/2015 12/14/15   Arvilla Market, DO   BP 130/81 mmHg  Pulse 88  Temp(Src) 98.6 F (37 C) (Oral)  Resp 21  Ht 5\' 6"  (1.676 m)  Wt 247 lb 5 oz (112.18 kg)  BMI 39.94 kg/m2  SpO2 92% Physical Exam  Constitutional: She is oriented to person, place, and time. She appears well-developed and well-nourished.  HENT:  Head: Normocephalic and atraumatic.  Eyes: Conjunctivae are normal. Pupils are equal, round, and reactive to light.   Neck: Neck supple. No tracheal deviation present. No thyromegaly present.  Cardiovascular: Normal rate and regular rhythm.   No murmur heard. Pulmonary/Chest: Effort normal and breath sounds normal.  Abdominal: Soft. Bowel sounds are normal. She exhibits no distension. There is no tenderness.  Obese  Musculoskeletal: Normal range of motion. She exhibits no edema or tenderness.  Neurological: She is alert and oriented to person, place, and time. No cranial nerve deficit. Coordination normal.  Gait normal  Skin: Skin is warm and dry. No rash noted.  Psychiatric: She has a normal mood and affect.  Nursing note and vitals reviewed.   ED Course  Procedures (including critical care time) Labs Review Labs Reviewed  CBC - Abnormal; Notable for the following:    RBC 5.43 (*)    All other components within normal limits  BASIC METABOLIC PANEL  URINALYSIS, ROUTINE W REFLEX MICROSCOPIC (NOT AT [facility])  HEPATIC FUNCTION PANEL  I-STAT TROPOININ, ED  CBG MONITORING, ED    Imaging Review Dg Chest Port 1 View  12/17/2015  CLINICAL DATA:  Weakness since yesterday.  Headache.  RIGHT arm pain EXAM: PORTABLE CHEST 1 VIEW COMPARISON:  10/17/2010 FINDINGS: Normal cardiac silhouette. Mild RIGHT basilar atelectasis. No effusion, infiltrate, pneumothorax. IMPRESSION: Mild basilar atelectasis similar to comparison exam. Electronically Signed   By: Genevive Bi M.D.   On: 12/17/2015 14:53   I have personally reviewed and evaluated these images and lab results as part of my medical decision-making.   EKG Interpretation   Date/Time:  Sunday December 17 2015 14:01:52 EST Ventricular Rate:  92 PR Interval:  146 QRS Duration: 88 QT Interval:  328 QTC Calculation: 405 R Axis:   78 Text Interpretation:  Normal sinus rhythm ST \\T \ T wave abnormality,  consider inferior ischemia ST \\T \ T wave abnormality, consider  anterolateral ischemia Abnormal ECG Confirmed by ZAMMIT  MD, JOSEPH  [phone]) on 12/17/2015  2:14:08 PM     Results for orders placed or performed during the hospital encounter of 12/17/15  Basic metabolic panel  Result Value Ref Range   Sodium 139 135 - 145 mmol/L   Potassium 3.6 3.5 - 5.1 mmol/L   Chloride 100 (L) 101 - 111 mmol/L   CO2 27 22 - 32 mmol/L   Glucose, Bld 362 (H) 65 - 99 mg/dL   BUN 7 6 - 20 mg/dL   Creatinine, Ser 6.04 (H) 0.44 - 1.00 mg/dL   Calcium 9.4 8.9 - 54.0 mg/dL   GFR calc non Af Amer 56 (L) >60 mL/min   GFR calc Af Amer >60 >60 mL/min   Anion gap 12 5 - 15  CBC  Result Value Ref Range   WBC 6.0 4.0 - 10.5 K/uL   RBC 5.43 (H) 3.87 - 5.11 MIL/uL   Hemoglobin 14.8 12.0 - 15.0 g/dL   HCT 98.1 19.1 - 47.8 %   MCV 80.7 78.0 - 100.0 fL   MCH 27.3 26.0 - 34.0 pg   MCHC 33.8 30.0 - 36.0 g/dL  RDW 15.2 11.5 - 15.5 %   Platelets 195 150 - 400 K/uL  Hepatic function panel  Result Value Ref Range   Total Protein 7.1 6.5 - 8.1 g/dL   Albumin 3.7 3.5 - 5.0 g/dL   AST 21 15 - 41 U/L   ALT 16 14 - 54 U/L   Alkaline Phosphatase 61 38 - 126 U/L   Total Bilirubin 0.5 0.3 - 1.2 mg/dL   Bilirubin, Direct 0.3 0.1 - 0.5 mg/dL   Indirect Bilirubin 0.2 (L) 0.3 - 0.9 mg/dL  CBG monitoring, ED  Result Value Ref Range   Glucose-Capillary 336 (H) 65 - 99 mg/dL   Dg Chest Port 1 View  12/17/2015  CLINICAL DATA:  Weakness since yesterday.  Headache.  RIGHT arm pain EXAM: PORTABLE CHEST 1 VIEW COMPARISON:  10/17/2010 FINDINGS: Normal cardiac silhouette. Mild RIGHT basilar atelectasis. No effusion, infiltrate, pneumothorax. IMPRESSION: Mild basilar atelectasis similar to comparison exam. Electronically Signed   By: Genevive BiStewart  Edmunds M.D.   On: 12/17/2015 14:53     MDM  Patient with hyperglycemia noted to be lightheaded on standing. Maybe mildly volume depleted. I spoke with Dr.Mayo plan 23 hour observation, telemetry, monitor glucose,iv hdration Final diagnoses:  None   Dx #1 near syncope #2 hyperglycemia     Doug SouSam Jodine Muchmore, MD 12/17/15 [phone]

## 2015-12-17 NOTE — ED Notes (Signed)
Pt c/o dizziness, right arm pain, headache onset yesterday. Pt recently started on cyclobenzaprine and diclofenac on the 12th of this month.

## 2015-12-17 NOTE — ED Notes (Signed)
Not in  Room yet still on pod a

## 2015-12-17 NOTE — H&P (Signed)
Family Medicine Teaching [facility] Admission History and Physical Service Pager: [phone]  Patient name: Jenna [name] Medical record number: [id number] Date of birth: [birth year] Age: 55 y.o. Gender: female  Primary Care Provider: Wenda Low, MD Consultants: None Code Status: Full  Chief Complaint: Dizziness  Assessment and Plan: Jenna [name] is a 55 y.o. female presenting with near syncope. PMH is significant for HTN, depression, compression fracture, postsurgical hypothyroidism.  Dizziness/Pre-syncope : Pt with multiple episodes of dizziness and blurred vision. No LOC. The dizziness could be a side effect of the Flexeril, because the dizziness started after she began taking Flexeril. She could also have orthostatic hypotension, as her dizziness is worse when she stands up. She had hyperglycemia to 362 on admission, which can also cause dizziness. She could be having seizures, given her history, but she denies any abnormal body movements, bladder/bowel incontinence, or confusion after the episodes. Another differential is cardiac arrhythmia, although she had a regular rate and rhythm on exam. She could also have ACS, given her EKG which shows ST depression in leads II, aVF, V3-V5, however, her i-stat troponin is 0.00. Stroke is less likely because she has a completely normal neuro exam. - Admit to telemetry, attending Dr. Jennette Kettle - s/p 1L NS bolus in the ED - Will stop Flexeril, because it may be causing her dizziness and she states it is not helping her shoulder pain. - Orthostatic vitals to rule out orthostatic hypotension - Hyperglycemia work-up as below - Trend troponins x 3 and AM EKG to rule out ACS - Cardiac monitoring for arrhythmias - Vitals per unit routine - Repeat CBC and BMP in the AM  Hyperglycemia: Blood glucose to 362 in the ED. She does not have a history of diabetes. - Will order HgbA1c - Depending on HgbA1c, will consider adding CBGs qACHS and SSI.    Right shoulder pain: Likely acute strain of the supraspinatus after moving furniture  - Tylenol and K-pad for pain  Hypertension: BP 146/83 on admission - Continue home Norvasc 10mg  daily and Lisinopril 5mg  daily  Hx of seizures: Last seizure ~3 months ago. - Continue home Depakote 250mg  bid - Has not seen neurologist in some time since her previous neurologist left. Need outpatient neuro follow-up  Postsurgical hypothyroidism: Pt underwent total thyroidectomy in 2010. TSH on admission was 52. - Pt taking Synthroid at home - Will increase Synthroid to daily - Recommend follow-up as an outpatient  FEN/GI: Heart healthy carb-modified diet Prophylaxis: Heparin sq  Disposition: Admit to telemetry, attending Dr. Jennette Kettle.  History of Present Illness:  Jenna [name] is a 55 y.o. female presenting with dizziness that started yesterday. She was in [place] and began feeling "dizzy" and "lightheaded". She "felt like she was going to pass out". She did not feel like the room was spinning. She also noted blurry vision that "lasted a second". The dizziness lasted for 5 minutes. The dizziness has been on and off since then. She thinks the dizziness is caused by the Flexeril that she started taking 2 days ago for a muscle strain in her shoulder. She has not had any dizziness until she started taking this medication. She has had about 10 episodes of dizziness since yesterday. She has not had any loss of consciousness or falls. No palpitations, no shortness of breath, no chest pain. She does have a history of seizures, which her last seizure being 3-4 months ago. She does not usually have any dizziness with the seizures. She has not had any  abnormal body movements, bowel/bladder incontinence, or confusion. She ate and drank like normal yesterday.   She has had right shoulder pain for the last week. The shoulder pain started when she was moving furniture at the beginning of the week. She was  seen in our clinic on 1/12 and was prescribed Voltaren tablets and Flexeril. The shoulder pain has not gotten any better since then.   She has had a headache that started yesterday. The headache feels "throbbing" and is located in the temples and in the back. The headaches are related to the dizziness. The headache is constant. Hasn't tried any medications at home.    She endorses a little bit of chest pain in the ED. The pain is located in the center of her chest. The pain was only present for a short period of time and felt like "gas pain". She is not currently having any chest pain.  In the ED, CMP was unremarkable, except for an elevated Cr to 1.11, which appears to be her baseline, and an elevated glucose to 362. CBC was normal. UA showed few bacteria, >1000 glucose, negative ketones, negative leukocytes, negative nitrites, and 0-5 WBC. I-stat troponin was 0.00. EKG showed new ST depression in leads II, aVF, V3-V5. CXR showed bibasilar atelectasis, unchanged from previous exam.  Review Of Systems: Per HPI with the following additions: None Otherwise the remainder of the systems were negative.  Patient Active Problem List   Diagnosis Date Noted  . Near syncope 12/17/2015  . Shoulder pain, right 12/14/2015  . Vaginal itching 12/14/2015  . Pain of right thumb 05/10/2015  . Abdominal pain, epigastric 05/10/2015  . Vertebral compression fracture (HCC) 12/18/2014  . Excessive cerumen in right ear canal 12/14/2014  . Knee pain 08/16/2014  . Breast pain, left 07/28/2014  . LLQ abdominal pain 07/28/2014  . Neck pain on right side 05/17/2014  . Intertrigo 11/03/2013  . Right hand pain 10/20/2012  . Health care maintenance 10/20/2012  . Seizure (HCC) 04/07/2012  . Carpal tunnel syndrome on both sides 12/06/2011  . Plantar fasciitis 08/30/2011  . DeQuervain's disease (tenosynovitis) 04/22/2011  . Shoulder pain, left 04/19/2011  . Depressive illness 01/22/2011  . ARTHRALGIA 12/28/2010  .  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 Primary hyperparathyroidism (HCC) 05/25/2010  . HYPERTENSION, BENIGN 01/06/2009  . OBESITY, NOS 01/29/2007    Past Medical History: Past Medical History  Diagnosis Date  . Hypertension   . Thyroid disease   . Seizures (HCC)     Past Surgical History: Past Surgical History  Procedure Laterality Date  . Total thyroidectomy  2010    primary hyperparathyroidism  . Parathyroidectomy  2010    primary hyperparpthyroidism  . Uterine ablation  2009    menorrhagia and severe anemia    Social History: Social History  Substance Use Topics  . Smoking status: Former Smoker    Types: Cigarettes    Quit date: 06/13/2010  . Smokeless tobacco: None  . Alcohol Use: No   Additional social history: None Please also refer to relevant sections of EMR.  Family History: No family history on file. Brother- Diabetes Mother- Diabetes Sister- Diabetes  Allergies and Medications: Allergies  Allergen Reactions  . Tramadol     "triggers seizures"   No current facility-administered medications on file prior to encounter.   Current Outpatient Prescriptions on File Prior to Encounter  Medication Sig Dispense Refill  . amLODipine (NORVASC) 10 MG tablet Take 1 tablet (10 mg total) by mouth daily. 90 tablet 1  .  cyclobenzaprine (FLEXERIL) 10 MG tablet Take 1 tablet (10 mg total) by mouth 3 (three) times daily as needed for muscle spasms. 30 tablet 0  . diclofenac (VOLTAREN) 75 MG EC tablet Take 1 tablet (75 mg total) by mouth 2 (two) times daily. 30 tablet 0  . divalproex (DEPAKOTE) 250 MG DR tablet Take 1 tablet (250 mg total) by mouth 2 (two) times daily. 180 tablet 0  . levothyroxine (SYNTHROID, LEVOTHROID) 125 MCG tablet TAKE ONE TABLET BY MOUTH ONCE DAILY BEFORE BREAKFAST 60 tablet 2  . lisinopril (PRINIVIL,ZESTRIL) 5 MG tablet Take 1 tablet (5 mg total) by mouth daily. 30 tablet 5  . calcitonin, salmon, (MIACALCIN/FORTICAL) 200 UNIT/ACT nasal  spray Place 1 spray into alternate nostrils daily. (Patient not taking: Reported on 12/17/2015) 3.7 mL 12  . carbamide peroxide (DEBROX) 6.5 % otic solution Place 5 drops into both ears 2 (two) times daily. (Patient not taking: Reported on 12/17/2015) 15 mL 0  . fluconazole (DIFLUCAN) 150 MG tablet Take 1 tablet (150 mg total) by mouth once. Take second tablet if symptoms not resolved 3 days later. (Patient not taking: Reported on 12/17/2015) 2 tablet 0    Objective: BP 129/80 mmHg  Pulse 82  Temp(Src) 98.6 F (37 C) (Oral)  Resp 16  Ht 5\' 6"  (1.676 m)  Wt 247 lb 5 oz (112.18 kg)  BMI 39.94 kg/m2  SpO2 91% Exam: General: Well-appearing female, laying in bed, in NAD, pleasant and conversational Eyes: PERRLA, EOMI ENTM: Oropharynx clear, MMM Neck: Supple Cardiovascular: RRR, no murmurs, no carotid bruits Respiratory: CTAB, normal work of breathing Abdomen: +BS, soft, non-distended, tenderness to palpation of the epigastric area MSK: Warm and well-perfused, no edema, moves all extremities  Right shoulder: no erythema or gross deformity, active and passive ROM limited by pain, decreased abduction, normal external and internal rotation, + empty can test Skin: No rashes or lesions Neuro: Awake, alert, normal sensation, smile symmetric, 5/5 muscle strength in upper and lower extremities bilaterally, CN 2-12 intact, no focal deficits Psych: Appropriate affect, normal behavior  Labs and Imaging: CBC BMET   Recent Labs Lab 12/17/15 1434  WBC 6.0  HGB 14.8  HCT 43.8  PLT 195    Recent Labs Lab 12/17/15 1434  NA 139  K 3.6  CL 100*  CO2 27  BUN 7  CREATININE 1.11*  GLUCOSE 362*  CALCIUM 9.4     UA: few bacteria, >1000 glucose, negative ketones, negative nitrites, 0-5 WBC I-stat troponin 0.00 EKG: mild ST depression in leads II, aVF, V3-V5 CXR: bibasilar atelectasis, unchanged from previous exam.  Campbell Stall, MD 12/17/2015, 4:10 PM PGY-1, [facility] FPTS Intern pager: [phone], text pages welcome  I have read and agree with the amended note as above.   Wenda Low, MD, PGY-3 10:05 PM

## 2015-12-17 NOTE — ED Notes (Signed)
Pt cbg 336

## 2015-12-18 DIAGNOSIS — E890 Postprocedural hypothyroidism: Secondary | ICD-10-CM | POA: Diagnosis not present

## 2015-12-18 DIAGNOSIS — R55 Syncope and collapse: Secondary | ICD-10-CM | POA: Diagnosis present

## 2015-12-18 DIAGNOSIS — E210 Primary hyperparathyroidism: Secondary | ICD-10-CM | POA: Diagnosis not present

## 2015-12-18 DIAGNOSIS — E669 Obesity, unspecified: Secondary | ICD-10-CM | POA: Diagnosis not present

## 2015-12-18 DIAGNOSIS — E119 Type 2 diabetes mellitus without complications: Secondary | ICD-10-CM

## 2015-12-18 DIAGNOSIS — I10 Essential (primary) hypertension: Secondary | ICD-10-CM | POA: Diagnosis not present

## 2015-12-18 LAB — BASIC METABOLIC PANEL
Anion gap: 7 (ref 5–15)
BUN: 8 mg/dL (ref 6–20)
CALCIUM: 9.3 mg/dL (ref 8.9–10.3)
CO2: 29 mmol/L (ref 22–32)
Chloride: 106 mmol/L (ref 101–111)
Creatinine, Ser: 1.16 mg/dL — ABNORMAL HIGH (ref 0.44–1.00)
GFR calc Af Amer: >60 mL/min (ref >60)
GFR, EST NON AFRICAN AMERICAN: 53 mL/min — AB (ref >60)
GLUCOSE: 353 mg/dL — AB (ref 65–99)
POTASSIUM: 4.1 mmol/L (ref 3.5–5.1)
SODIUM: 142 mmol/L (ref 135–145)

## 2015-12-18 LAB — TROPONIN I
Troponin I: <0.03 ng/mL (ref <0.031)
Troponin I: <0.03 ng/mL (ref <0.031)

## 2015-12-18 LAB — CBC
HCT: 39.2 % (ref 36.0–46.0)
Hemoglobin: 13.1 g/dL (ref 12.0–15.0)
MCH: 27.1 pg (ref 26.0–34.0)
MCHC: 33.4 g/dL (ref 30.0–36.0)
MCV: 81 fL (ref 78.0–100.0)
PLATELETS: 186 10*3/uL (ref 150–400)
RBC: 4.84 MIL/uL (ref 3.87–5.11)
RDW: 15.2 % (ref 11.5–15.5)
WBC: 6.6 10*3/uL (ref 4.0–10.5)

## 2015-12-18 LAB — HEMOGLOBIN A1C
HEMOGLOBIN A1C: 11.7 % — AB (ref 4.8–5.6)
Mean Plasma Glucose: 289 mg/dL

## 2015-12-18 MED ORDER — AMLODIPINE BESYLATE 10 MG PO TABS: 10.0000 mg | ORAL_TABLET | Freq: Every day | ORAL | Status: AC

## 2015-12-18 MED ORDER — LEVOTHYROXINE SODIUM 100 MCG PO TABS: 125.0000 ug | ORAL_TABLET | Freq: Every day | ORAL | Status: DC

## 2015-12-18 MED ORDER — METFORMIN HCL 500 MG PO TABS: 500.0000 mg | ORAL_TABLET | Freq: Every day | ORAL | Status: DC

## 2015-12-18 MED ORDER — GLIPIZIDE 10 MG PO TABS: 10.0000 mg | ORAL_TABLET | Freq: Every day | ORAL | Status: DC

## 2015-12-18 MED ORDER — ASPIRIN 81 MG PO TBEC: 81.0000 mg | DELAYED_RELEASE_TABLET | Freq: Every day | ORAL | Status: AC

## 2015-12-18 NOTE — Discharge Instructions (Signed)
You were admitted for dizziness.  You were found to be newly diagnosed with Diabetes.  You are being discharged on 2 medications to help manage this.  Common side effect of the medications may be bloating and/or diarrhea.  These tend to resolve after about 2 weeks of taking the medications daily.  You have a follow up appointment at the [facility].  Please make sure that you go to this appointment.  Diabetes Mellitus and Food It is important for you to manage your blood sugar (glucose) level. Your blood glucose level can be greatly affected by what you eat. Eating healthier foods in the appropriate amounts throughout the day at about the same time each day will help you control your blood glucose level. It can also help slow or prevent worsening of your diabetes mellitus. Healthy eating may even help you improve the level of your blood pressure and reach or maintain a healthy weight.  General recommendations for healthful eating and cooking habits include:  Eating meals and snacks regularly. Avoid going long periods of time without eating to lose weight.  Eating a diet that consists mainly of plant-based foods, such as fruits, vegetables, nuts, legumes, and whole grains.  Using low-heat cooking methods, such as baking, instead of high-heat cooking methods, such as deep frying. Work with your dietitian to make sure you understand how to use the Nutrition Facts information on food labels. HOW CAN FOOD AFFECT ME? Carbohydrates Carbohydrates affect your blood glucose level more than any other type of food. Your dietitian will help you determine how many carbohydrates to eat at each meal and teach you how to count carbohydrates. Counting carbohydrates is important to keep your blood glucose at a healthy level, especially if you are using insulin or taking certain medicines for diabetes mellitus. Alcohol Alcohol can cause sudden decreases in blood glucose (hypoglycemia), especially if you use  insulin or take certain medicines for diabetes mellitus. Hypoglycemia can be a life-threatening condition. Symptoms of hypoglycemia (sleepiness, dizziness, and disorientation) are similar to symptoms of having too much alcohol.  If your health care provider has given you approval to drink alcohol, do so in moderation and use the following guidelines:  Women should not have more than one drink per day, and men should not have more than two drinks per day. One drink is equal to:  12 oz of beer.  5 oz of wine.  1 oz of hard liquor.  Do not drink on an empty stomach.  Keep yourself hydrated. Have water, diet soda, or unsweetened iced tea.  Regular soda, juice, and other mixers might contain a lot of carbohydrates and should be counted. WHAT FOODS ARE NOT RECOMMENDED? As you make food choices, it is important to remember that all foods are not the same. Some foods have fewer nutrients per serving than other foods, even though they might have the same number of calories or carbohydrates. It is difficult to get your body what it needs when you eat foods with fewer nutrients. Examples of foods that you should avoid that are high in calories and carbohydrates but low in nutrients include:  Trans fats (most processed foods list trans fats on the Nutrition Facts label).  Regular soda.  Juice.  Candy.  Sweets, such as cake, pie, doughnuts, and cookies.  Fried foods. WHAT FOODS CAN I EAT? Eat nutrient-rich foods, which will nourish your body and keep you healthy. The food you should eat also will depend on several factors, including:  The  calories you need.  The medicines you take.  Your weight.  Your blood glucose level.  Your blood pressure level.  Your cholesterol level. You should eat a variety of foods, including:  Protein.  Lean cuts of meat.  Proteins low in saturated fats, such as fish, egg whites, and beans. Avoid processed meats.  Fruits and vegetables.  Fruits and  vegetables that may help control blood glucose levels, such as apples, mangoes, and yams.  Dairy products.  Choose fat-free or low-fat dairy products, such as milk, yogurt, and cheese.  Grains, bread, pasta, and rice.  Choose whole grain products, such as multigrain bread, whole oats, and brown rice. These foods may help control blood pressure.  Fats.  Foods containing healthful fats, such as nuts, avocado, olive oil, canola oil, and fish. DOES EVERYONE WITH DIABETES MELLITUS HAVE THE SAME MEAL PLAN? Because every person with diabetes mellitus is different, there is not one meal plan that works for everyone. It is very important that you meet with a dietitian who will help you create a meal plan that is just right for you.   This information is not intended to replace advice given to you by your health care provider. Make sure you discuss any questions you have with your health care provider.   Document Released: 08/15/2005 Document Revised: 12/09/2014 Document Reviewed: 10/15/2013 Else[employer] Interactive Patient Education [employer]2016 [employer]

## 2015-12-18 NOTE — Progress Notes (Signed)
Family Medicine Teaching Service Daily Progress Note Intern Pager: [phone]  Patient name: Jenna [name] Medical record number: [id number] Date of birth: Mar 09, 1961 Age: 55 y.o. Gender: female  Primary Care Provider: Wenda Low, MD Consultants: None Code Status: Full  Pt Overview and Major Events to Date:  1/15: Admitted to FMTS for near syncopal events  Assessment and Plan: Jenna [name] is a 55 y.o. female presenting with near syncope. PMH is significant for HTN, depression, compression fracture, postsurgical hypothyroidism.  Dizziness/Pre-syncope: Likely medication side effect in the setting of hyperglycemia and hypothyroidism. Orthostatic vitals were negative. Troponins negative x 2. EKG on admission showed ST depressions in leads II, aVF, V3-V5, which has resolved on EKG this morning. Has been in normal sinus rhythm overnight. - Hyperglycemia work-up as below - Cardiac monitoring for arrhythmias - Vitals per unit routine - Up with assistance  Hyperglycemia: Blood glucose to 362 in the ED. She does not have a history of diabetes. - Hemoglobin A1c pending - Depending on HgbA1c, will consider adding CBGs qACHS and SSI.   Right shoulder pain: Likely acute strain of the supraspinatus after moving furniture  - Tylenol and K-pad for pain  Hypertension: BPs ranging from 129-163/80-90 overnight. - Continue home Norvasc 10mg  daily and Lisinopril 5mg  daily  Hx of seizures: Last seizure ~3 months ago. - Continue home Depakote 250mg  bid - Has not seen neurologist in some time since her previous neurologist left. 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 TSH on admission was 52. - Pt states she only takes Synthroid 1-2 times per week. We will keep her at her current dose of daily and encourage her to take this medication every day. - Recommend follow-up as an outpatient  FEN/GI: Heart healthy carb-modified  diet Prophylaxis: Heparin sq  Disposition: Anticipate discharge home today.  Subjective:  Pt states she has been out of bed multiple times to use the bathroom and has not had any episodes of dizziness or weakness. She states she only takes her Synthroid 1-2 times per week. She has no concerns this morning.  Objective: Temp:  [97.6 F (36.4 C)-98.6 F (37 C)] 98.2 F (36.8 C) (01/16 0535) Pulse Rate:  [65-88] 67 (01/16 0535) Resp:  [15-22] 19 (01/16 0535) BP: (129-163)/(80-96) 150/94 mmHg (01/16 0535) SpO2:  [91 %-96 %] 95 % (01/16 0535) Weight:  [247 lb 5 oz (112.18 kg)-248 lb 14.4 oz (112.9 kg)] 248 lb 14.4 oz (112.9 kg) (01/16 0535) Physical Exam: General: Well-appearing female, laying in bed, in NAD, conversational, tearful when I tell her we are concerned that she has diabetes. Eyes: PERRLA, EOMI ENTM: Oropharynx clear, MMM Neck: Supple Cardiovascular: RRR, no murmurs, no carotid bruits Respiratory: CTAB, normal work of breathing Abdomen: +BS, soft, non-distended, tenderness to palpation of the epigastric area MSK: Warm and well-perfused, no edema, moves all extremities Skin: No rashes or lesions Neuro: Awake, alert, CN 2-12 intact, no focal deficits Psych: Appropriate affect, normal behavior  Laboratory:  Recent Labs Lab 12/17/15 1434 12/18/15 0210  WBC 6.0 6.6  HGB 14.8 13.1  HCT 43.8 39.2  PLT 195 186    Recent Labs Lab 12/17/15 1434 12/18/15 0210  NA 139 142  K 3.6 4.1  CL 100* 106  CO2 27 29  BUN 7 8  CREATININE 1.11* 1.16*  CALCIUM 9.4 9.3  PROT 7.1  --   BILITOT 0.5  --   ALKPHOS 61  --   ALT 16  --   AST  21  --   GLUCOSE 362* 353*   UA: few bacteria, >1000 glucose, negative ketones, negative nitrites, 0-5 WBC I-stat troponin 0.00 EKG: mild ST depression in leads II, aVF, V3-V5  Imaging/Diagnostic Tests: CXR: bibasilar atelectasis, unchanged from previous exam.  Campbell StallKaty Dodd Desteni Piscopo, MD 12/18/2015, 6:49 AM PGY-1, [facility] FPTS Intern pager: [phone], text pages welcome

## 2015-12-18 NOTE — Progress Notes (Signed)
Pt has orders to be discharged. Discharge instructions given and pt has no additional questions at this time. Medication regimen reviewed and pt educated. Pt verbalized understanding and has no additional questions. Telemetry box removed. IV removed and site in good condition. Pt stable and waiting for transportation.   Angelena Sand RN 

## 2015-12-18 NOTE — Progress Notes (Addendum)
Inpatient Diabetes Program Recommendations  AACE/ADA: New Consensus Statement on Inpatient Glycemic Control (2015)  Target Ranges:  Prepandial:   less than 140 mg/dL      Peak postprandial:   less than 180 mg/dL (1-2 hours)      Critically ill patients:  140 - 180 mg/dL   Review of Glycemic Control  Consult received for new-onset DM2.  Spoke with RN who reports patient has already been discharged. Coordinator not able to see patient.   OP education ordered with cosign needed.   Thank you  Piedad ClimesGina Shell Blanchette BSN, RN,CDE Inpatient Diabetes Coordinator [phone] (team pager)

## 2015-12-19 NOTE — Discharge Summary (Signed)
Family Medicine Teaching [facility] Discharge Summary  Patient name: Jenna [name] Medical record number: [id number] Date of birth: [birth year] Age: 55 y.o. Gender: female Date of Admission: 12/17/2015  Date of Discharge: 12/18/15 Admitting Physician: Nestor Ramp, MD  Primary Care Provider: Wenda Low, MD Consultants: None  Indication for Hospitalization: Presyncope  Discharge Diagnoses/Problem List:  Type 2 Diabetes Mellitus Hypertension Postsurgical hypothyroidism Primary hyperparathyroidism Right rotator cuff strain  Disposition: Home  Discharge Condition: Stable  Discharge Exam:  General: Well-appearing female, laying in bed, in NAD, conversational, tearful when talking about her new diagnosis of diabetes. Eyes: PERRLA, EOMI ENTM: Oropharynx clear, MMM Neck: Supple Cardiovascular: RRR, no murmurs, no carotid bruits Respiratory: CTAB, normal work of breathing Abdomen: +BS, soft, non-distended, tenderness to palpation of the epigastric area MSK: Warm and well-perfused, no edema, moves all extremities Skin: No rashes or lesions Neuro: Awake, alert, CN 2-12 intact, no focal deficits Psych: Appropriate affect, normal behavior  Brief Hospital Course:  Jenna [name] is a 55 year old female who presented to the ED with multiple episodes of dizziness and blurred vision over the last 2 days. In the ED, she had an elevated glucose to 362 and UA showed >1000 glucose. I-stat troponin was negative but EKG showed new ST depression in leads II, aVF, V3-V5. She was admitted for presyncope work-up.  Jenna [name] noted that the dizzy episodes began after she started taking Flexeril for a right rotator cuff strain, so we stopped her Flexeril. Orthostatic vitals were negative. To rule out ACS, we trended troponins x 3, which were all negative. We repeated her EKG with showed resolution of the ST depression seen in the ED.   Given her hyperglycemia in the ED, we ordered a hemoglobin  A1c. She did not have a diagnosis of diabetes prior to admission. Her hemoglobin A1c was 11.7%. After a long discussion with Jenna [name] and her husband, she was started on Metformin  qd and Glipizide  daily. She was adamant about not starting insulin at this time. We also checked a TSH, which was 52. We initially increased her Synthroid dose to , but Jenna [name] stated that she has only been taking the Synthroid 1-2 times per week. We kept her dose at and encouraged her to take the medication daily.   Overall, we thought her presyncope was multifactorial in origin. The Flexeril, hyperglycemia, and hypothyroidism were all likely contributors. On the day of discharge, she was able to ambulate around her room without any dizziness. She was discharged home with close PCP follow-up.  Issues for Follow Up:  1. Patient stated that she was interested in talking with a nutritionist about learning to control her blood sugars through diet. Please refer her to Dr. Gerilyn Pilgrim. 2. Patient's first EKG showed new ST depression in leads II, aVF, V3-V5. Troponins were negative. Given her risk factors, please consider a cardiac stress test as an outpatient.  3. For her new diagnosis of diabetes, Jenna [name] was started on Metformin  daily. Please increase this dose as tolerated. She was also started on Glipizide. Please make sure she is taking this. 4. Jenna [name] was only taking her Synthroid 1-2 times per week. Please make sure she has started taking it daily. Recommend repeat TSH as an outpatient at some point in the future. 5. Patient has a history of seizures, for which she takes Depakote. Because Depakote can cause weight gain and hyperglycemia, we recommend switching to another agent such as Keppra. Please make sure Ms.  [name] schedules an appointment with her Neurologist.  Significant Procedures: None  Significant Labs and Imaging:   Recent Labs Lab 12/17/15 1434 12/18/15 0210  WBC 6.0 6.6   HGB 14.8 13.1  HCT 43.8 39.2  PLT 195 186    Recent Labs Lab 12/17/15 1434 12/18/15 0210  NA 139 142  K 3.6 4.1  CL 100* 106  CO2 27 29  GLUCOSE 362* 353*  BUN 7 8  CREATININE 1.11* 1.16*  CALCIUM 9.4 9.3  ALKPHOS 61  --   AST 21  --   ALT 16  --   ALBUMIN 3.7  --    UA: few bacteria, >1000 glucose, negative ketones, negative nitrites, 0-5 WBC I-stat troponin 0.00 TSH 52 HgbA1c: 11.7% EKG: mild ST depression in leads II, aVF, V3-V5  CXR: bibasilar atelectasis, unchanged from previous exam.  Results/Tests Pending at Time of Discharge: None  Discharge Medications:    Medication List    TAKE these medications        amLODipine 10 MG tablet  Commonly known as:  NORVASC  Take 1 tablet (10 mg total) by mouth daily.     aspirin 81 MG EC tablet  Take 1 tablet (81 mg total) by mouth daily.     divalproex 250 MG DR tablet  Commonly known as:  DEPAKOTE  Take 1 tablet (250 mg total) by mouth 2 (two) times daily.     glipiZIDE 10 MG tablet  Commonly known as:  GLUCOTROL  Take 1 tablet (10 mg total) by mouth daily before breakfast.     levothyroxine 125 MCG tablet  Commonly known as:  SYNTHROID, LEVOTHROID  TAKE ONE TABLET BY MOUTH ONCE DAILY BEFORE BREAKFAST     lisinopril 5 MG tablet  Commonly known as:  PRINIVIL,ZESTRIL  Take 1 tablet (5 mg total) by mouth daily.     metFORMIN 500 MG tablet  Commonly known as:  GLUCOPHAGE  Take 1 tablet (500 mg total) by mouth daily with breakfast.        Discharge Instructions: Please refer to Patient Instructions section of EMR for full details.  Patient was counseled important signs and symptoms that should prompt return to medical care, changes in medications, dietary instructions, activity restrictions, and follow up appointments.   Follow-Up Appointments: Follow-up Information    Follow up with Wenda Low, MD On 12/21/2015.   Specialty:  Family Medicine   Why:  hospital follow-up appointment at 11:00am    Contact information:   [address] [place] [address] [phone]       Campbell Stall, MD 12/19/2015, 7:02 PM PGY-1, [facility]

## 2015-12-21 ENCOUNTER — Ambulatory Visit (INDEPENDENT_AMBULATORY_CARE_PROVIDER_SITE_OTHER): Payer: BLUE CROSS/BLUE SHIELD | Admitting: Family Medicine

## 2015-12-21 ENCOUNTER — Encounter: Payer: Self-pay | Admitting: Family Medicine

## 2015-12-21 VITALS — BP 150/88 | HR 81 | Temp 98.3°F | Wt 250.3 lb

## 2015-12-21 DIAGNOSIS — E119 Type 2 diabetes mellitus without complications: Secondary | ICD-10-CM

## 2015-12-21 DIAGNOSIS — I10 Essential (primary) hypertension: Secondary | ICD-10-CM | POA: Diagnosis not present

## 2015-12-21 DIAGNOSIS — E890 Postprocedural hypothyroidism: Secondary | ICD-10-CM

## 2015-12-21 DIAGNOSIS — R569 Unspecified convulsions: Secondary | ICD-10-CM | POA: Diagnosis not present

## 2015-12-21 MED ORDER — GLUCOSE BLOOD VI STRP: ORAL_STRIP | Status: AC

## 2015-12-21 MED ORDER — ONETOUCH ULTRASOFT LANCETS MISC: Status: AC

## 2015-12-21 MED ORDER — ONETOUCH ULTRA 2 W/DEVICE KIT: 1.0000 | PACK | Freq: Once | Status: AC

## 2015-12-21 NOTE — Progress Notes (Signed)
  Patient name: Jenna [name] MRN [id number]  Date of birth: Nov 16, 1961  CC & HPI:  Jenna [name] is a 55 y.o. female presenting today for  Hospital follow-up for her new onset diabetes.  She reports compliance with metformin 500 mg daily, glipizide 10 mg daily since discharge.  Note some diarrhea with metformin.  She is not able to check her blood sugars that she does not meter.  She reports compliance with her levothyroxine 125 g since hospital discharge.  She has many questions today about diabetic diet and need to reduce sugar intake. Currently drinking reg Mtn dew, trying to switch to juice and diet drinks. Reports eating a lot of bread and rice.   ROS: See HPI   Medical & Surgical Hx:  Reviewed  Medications & Allergies: Reviewed  Social History: Reviewed:   Objective Findings:  Vitals: BP 158/101 mmHg  Pulse 85  Temp(Src) 98.3 F (36.8 C) (Oral)  Wt 250 lb 4.8 oz (113.535 kg)  Gen: NAD CV: RRR w/o m/r/g, pulses +2 b/l Resp: CTAB w/ normal respiratory effort  Assessment & Plan:   HYPERTENSION, BENIGN  Blood pressure mildly elevated initially, then decrease on recheck.  -  Continue lisinopril 5 mg daily and Norvasc 10 mg daily.  We'll likely need to increase lisinopril at next visit -  Follow-up in 2-4 weeks  HYPOTHYROIDISM, POSTSURGICAL  Reports compliance with levothyroxine since discharge from the hospital.  -  Continue levothyroxine 125 g daily.  Stressed the importance of taking this on an empty stomach -  We'll recheck TSH in 3 months  Type 2 diabetes mellitus without complication, without long-term current use of insulin (HCC)  Does not have a meters was not checking blood sugars since discharge hospital.  Reports some diarrhea with metformin.  -  Continue glipizide 10 mg daily -  Continue metformin 500 mg every morning.  Advised her to call in 1 week total and he notes the diarrhea has subsided, will titrate up as tolerated.  If no improvement will switch to ER  formula -  Prescribed One Touch meter and supplies.  Advised to check blood sugars every morning -  She will call to report blood sugars in 1 week -  Follow-up for diabetes in 2-4 weeks -  Currently scheduled for diabetes education classes throughout the month of  February -  Will need referral to ophthalmologist at next visit -  Continue ASA 81 mg daily -  We'll need to calculate ASCVD risk at next visit for possible statin -  Long discussion on a diabetic diet and decreasing carbohydrate and regular soda intake.  Provided with diabetic diet information  Seizure  Advised her to call and schedule one with her neurologist to discuss switching Depakote as it can be associated with weight gain and hyperglycemia. -  She will call if she needs a new referral

## 2015-12-21 NOTE — Assessment & Plan Note (Addendum)
Does not have a meters was not checking blood sugars since discharge hospital.  Reports some diarrhea with metformin.  -  Continue glipizide 10 mg daily -  Continue metformin 500 mg every morning.  Advised her to call in 1 week total and he notes the diarrhea has subsided, will titrate up as tolerated.  If no improvement will switch to ER formula -  Prescribed One Touch meter and supplies.  Advised to check blood sugars every morning -  She will call to report blood sugars in 1 week -  Follow-up for diabetes in 2-4 weeks -  Currently scheduled for diabetes education classes throughout the month of  February -  Will need referral to ophthalmologist at next visit -  Continue ASA 81 mg daily -  We'll need to calculate ASCVD risk at next visit for possible statin -  Long discussion on a diabetic diet and decreasing carbohydrate and regular soda intake.  Provided with diabetic diet information

## 2015-12-21 NOTE — Assessment & Plan Note (Signed)
Reports compliance with levothyroxine since discharge from the hospital.  -  Continue levothyroxine 125 g daily.  Stressed the importance of taking this on an empty stomach -  We'll recheck TSH in 3 months

## 2015-12-21 NOTE — Assessment & Plan Note (Signed)
Advised her to call and schedule one with her neurologist to discuss switching Depakote as it can be associated with weight gain and hyperglycemia. -  She will call if she needs a new referral

## 2015-12-21 NOTE — Patient Instructions (Addendum)
It was great seeing you today.   1.  Continue taking metformin 500 mg every morning.  Call and let me know how you're doing with this medication and if the diarrhea is improving.  Our goal is to increase this medication up to 2 pills twice a day.  2.  Continue taking glipizide 10 mg every day.  3.  Start checking her blood sugar first thing in the morning before eating or taking your medications.  4.  Call your neurologist to schedule appointment to discuss switching  Depakote to another seizure medication due to its effects on weight gain and blood sugar.  Let me know if you need a referral for this.  5.  Continue taking your levothyroxine on an empty stomachevery day at least 30 minutes prior to eating.    Please bring all your medications to every doctors visit  Sign up for My Chart to have easy access to your labs results, and communication with your Primary care physician.  Next Appointment  Please make an appointment with Dr Gayla Doss in 3-4 weeks   I look forward to talking with you again at our next visit. If you have any questions or concerns before then, please call the clinic at [phone].  Take Care,   Dr Wenda Low

## 2015-12-21 NOTE — Assessment & Plan Note (Signed)
Blood pressure mildly elevated initially, then decrease on recheck.  -  Continue lisinopril 5 mg daily and Norvasc 10 mg daily.  We'll likely need to increase lisinopril at next visit -  Follow-up in 2-4 weeks

## 2016-01-04 ENCOUNTER — Ambulatory Visit: Payer: BLUE CROSS/BLUE SHIELD

## 2016-01-11 ENCOUNTER — Ambulatory Visit: Payer: BLUE CROSS/BLUE SHIELD

## 2016-01-13 ENCOUNTER — Other Ambulatory Visit: Payer: Self-pay | Admitting: Family Medicine

## 2016-01-15 ENCOUNTER — Encounter: Payer: Self-pay | Admitting: Family Medicine

## 2016-01-15 ENCOUNTER — Ambulatory Visit (INDEPENDENT_AMBULATORY_CARE_PROVIDER_SITE_OTHER): Payer: BLUE CROSS/BLUE SHIELD | Admitting: Family Medicine

## 2016-01-15 VITALS — BP 151/82 | HR 101 | Temp 98.2°F | Wt 244.0 lb

## 2016-01-15 DIAGNOSIS — E119 Type 2 diabetes mellitus without complications: Secondary | ICD-10-CM

## 2016-01-15 DIAGNOSIS — I10 Essential (primary) hypertension: Secondary | ICD-10-CM | POA: Diagnosis not present

## 2016-01-15 MED ORDER — METFORMIN HCL 500 MG PO TABS: 1000.0000 mg | ORAL_TABLET | Freq: Two times a day (BID) | ORAL | Status: DC

## 2016-01-15 MED ORDER — GLIPIZIDE 10 MG PO TABS: 10.0000 mg | ORAL_TABLET | Freq: Every day | ORAL | Status: DC

## 2016-01-15 NOTE — Assessment & Plan Note (Signed)
Her blood pressure remains mildly elevated today.  She cannot recall if she is taking amlodipine, but reports continued take lisinopril 5 mg daily.  - She will call and let me know which blood pressure medications she is taking.  - Follow-up in 2-3 weeks - We'll need to recheck creatinine at that time

## 2016-01-15 NOTE — Patient Instructions (Signed)
It was great seeing you today.  Your blood pressure and blood sugar remain elevated.   1. Increased your metformin to 1 pill twice a day.  After one week, increase to 2 pills ( ) twice a day.  2. Call and let me know what his blood pressure medications you are currently taking.  3. Bring your diabetes meter and all medications to your next appointment   Please bring all your medications to every doctors visit  Sign up for My Chart to have easy access to your labs results, and communication with your Primary care physician.  Next Appointment  Please make an appointment with Dr Gayla Doss in 2-3 weeks.    I look forward to talking with you again at our next visit. If you have any questions or concerns before then, please call the clinic at [phone].  Take Care,   Dr Wenda Low

## 2016-01-15 NOTE — Assessment & Plan Note (Addendum)
She continues to take only 500 mg metformin once daily.  GI issues have resolved.  - Increase metformin to 500 twice a day 1 week, followed by 1000 twice a day - Follow-up in 2-3 weeks for diabetes - In the meantime, she is scheduled to start her diabetes education classes - advised to schedule ophthalmology appointment - Check lipid panel at follow-up appointment

## 2016-01-15 NOTE — Progress Notes (Signed)
  Patient name: Jenna [name] MRN [id number]  Date of birth: January 24, 1961  CC & HPI:  Jenna [name] is a 55 y.o. female presenting today for DM, HTN, HLD.   DIABETES  Lab Results  Component Value Date   HGBA1C 11.7* 12/17/2015    Blood Sugar Ranges: 140-170  Symptoms of Hypoglycemia? no  Comorbid Symptoms:  No Neuropathy: No Vision problems  Medication Side Effects: NO Diarrhea  CHRONIC HYPERTENSION BP Readings from Last 3 Encounters:  01/15/16 151/82  12/21/15 150/88  12/18/15 145/79    Disease Monitoring  Blood pressure range outside clinc: No  Chest pain: no   Dyspnea: no   Claudication: no  Medication Side Effects: No cough,lightheadedness, rash)    Preventitive Healthcare:  Diet: still drinking regular  Salt Restriction: < 1500 mg daily  ROS: See HPI    Objective Findings:  Vitals: BP 151/82 mmHg  Pulse 101  Temp(Src) 98.2 F (36.8 C) (Oral)  Wt 244 lb (110.678 kg)  Gen: NAD CV: RRR w/o m/r/g, pulses +2 b/l Resp: CTAB w/ normal respiratory effort  Assessment & Plan:   Type 2 diabetes mellitus without complication, without long-term current use of insulin (HCC) She continues to take only 500 mg metformin once daily.  GI issues have resolved.  - Increase metformin to 500 twice a day 1 week, followed by 1000 twice a day - Follow-up in 2-3 weeks for diabetes - In the meantime, she is scheduled to start her diabetes education classes - advised to schedule ophthalmology appointment - Check lipid panel at follow-up appointment  HYPERTENSION, BENIGN Her blood pressure remains mildly elevated today.  She cannot recall if she is taking amlodipine, but reports continued take lisinopril 5 mg daily.  - She will call and let me know which blood pressure medications she is taking.  - Follow-up in 2-3 weeks - We'll need to recheck creatinine at that time

## 2016-01-16 ENCOUNTER — Ambulatory Visit: Payer: BLUE CROSS/BLUE SHIELD

## 2016-01-16 NOTE — Telephone Encounter (Signed)
Please call she needs to make an appointment with her neurologist for Depakote refills. I have refilled her medication for 1 month.

## 2016-01-17 NOTE — Telephone Encounter (Signed)
LM for patient with message from MD ok per DPR.  Jazmin Hartsell,CMA  

## 2016-01-18 ENCOUNTER — Ambulatory Visit: Payer: BLUE CROSS/BLUE SHIELD

## 2016-01-23 ENCOUNTER — Ambulatory Visit: Payer: BLUE CROSS/BLUE SHIELD

## 2016-01-30 ENCOUNTER — Ambulatory Visit: Payer: BLUE CROSS/BLUE SHIELD

## 2016-02-05 ENCOUNTER — Encounter: Payer: Self-pay | Admitting: Family Medicine

## 2016-02-05 ENCOUNTER — Ambulatory Visit (INDEPENDENT_AMBULATORY_CARE_PROVIDER_SITE_OTHER): Payer: BLUE CROSS/BLUE SHIELD | Admitting: Family Medicine

## 2016-02-05 VITALS — BP 145/80 | HR 91 | Temp 98.1°F | Wt 247.0 lb

## 2016-02-05 DIAGNOSIS — E1122 Type 2 diabetes mellitus with diabetic chronic kidney disease: Secondary | ICD-10-CM

## 2016-02-05 DIAGNOSIS — E890 Postprocedural hypothyroidism: Secondary | ICD-10-CM

## 2016-02-05 DIAGNOSIS — E119 Type 2 diabetes mellitus without complications: Secondary | ICD-10-CM

## 2016-02-05 DIAGNOSIS — I10 Essential (primary) hypertension: Secondary | ICD-10-CM | POA: Diagnosis not present

## 2016-02-05 LAB — COMPLETE METABOLIC PANEL WITH GFR
ALBUMIN: 3.8 g/dL (ref 3.6–5.1)
ALK PHOS: 60 U/L (ref 33–130)
ALT: 19 U/L (ref 6–29)
AST: 18 U/L (ref 10–35)
BUN: 11 mg/dL (ref 7–25)
CALCIUM: 9.2 mg/dL (ref 8.6–10.4)
CHLORIDE: 107 mmol/L (ref 98–110)
CO2: 27 mmol/L (ref 20–31)
Creat: 1.11 mg/dL — ABNORMAL HIGH (ref 0.50–1.05)
GFR, EST NON AFRICAN AMERICAN: 57 mL/min — AB (ref >=60)
GFR, Est African American: 66 mL/min (ref >=60)
Glucose, Bld: 235 mg/dL — ABNORMAL HIGH (ref 65–99)
POTASSIUM: 3.6 mmol/L (ref 3.5–5.3)
SODIUM: 142 mmol/L (ref 135–146)
Total Bilirubin: 0.3 mg/dL (ref 0.2–1.2)
Total Protein: 6.7 g/dL (ref 6.1–8.1)

## 2016-02-05 LAB — LIPID PANEL
CHOL/HDL RATIO: 2.9 ratio (ref <=5.0)
CHOLESTEROL: 140 mg/dL (ref 125–200)
HDL: 49 mg/dL (ref >=46)
LDL Cholesterol: 37 mg/dL (ref <130)
Triglycerides: 268 mg/dL — ABNORMAL HIGH (ref <150)
VLDL: 54 mg/dL — AB (ref <30)

## 2016-02-05 LAB — TSH: TSH: 5.03 mIU/L — ABNORMAL HIGH

## 2016-02-05 MED ORDER — METFORMIN HCL 500 MG PO TABS: 1000.0000 mg | ORAL_TABLET | Freq: Two times a day (BID) | ORAL | Status: DC

## 2016-02-05 MED ORDER — LISINOPRIL 10 MG PO TABS: 10.0000 mg | ORAL_TABLET | Freq: Every day | ORAL | Status: DC

## 2016-02-05 NOTE — Assessment & Plan Note (Signed)
Blood pressure elevated today -  Increase lisinopril 10 mg daily -  Continue Norvasc 10 mg daily -  Continue to check blood pressure at home and bring meter to follow-up appointment in 2 weeks with Dr. Raymondo BandKoval

## 2016-02-05 NOTE — Progress Notes (Signed)
  Patient name: Jenna [name] MRN [id number]  Date of birth: 08-14-1961  CC & HPI:  Jenna [name] is a 55 y.o. female presenting today for DM, HTN, Thyroid.   DIABETES  Lab Results  Component Value Date   HGBA1C 11.7* 12/17/2015    Blood Sugar Ranges: 140-160  Symptoms of Hypoglycemia? no  Comorbid Symptoms:  no Neuropathy: no Vision problems  Medication Side Effects: Denies GI symptoms  CHRONIC HYPERTENSION BP Readings from Last 3 Encounters:  02/05/16 145/80  01/15/16 151/82  12/21/15 150/88    Disease Monitoring  Blood pressure range outside clinc: 140-150  Chest pain: no   Dyspnea: no   Claudication: no  Medication Side Effects: No rash, angioedema,lightheadedness,LE edema  HLD Lab Results  Component Value Date   LDLCALC 71 02/24/2009    Medication Compliance: not on statin  Medication compliance/financial difficulties: no, did not increase metformin to 1000mg  BID  Hypothyroidism -  Reports compliance with Synthroid 125 daily; However, she does take this with breakfast.  Usually eats cereal but doesn't drink milk  Smoking History Noted  Objective Findings:  Vitals: BP 145/80 mmHg  Pulse 91  Temp(Src) 98.1 F (36.7 C) (Oral)  Wt 247 lb (112.038 kg)  Gen: NAD; Obese HEENT:  Thyroid nonpalpable CV: RRR w/o m/r/g, pulses +2 b/l Resp: CTAB w/ normal respiratory effort  Assessment & Plan:   HYPERTENSION, BENIGN  Blood pressure elevated today -  Increase lisinopril 10 mg daily -  Continue Norvasc 10 mg daily -  Continue to check blood pressure at home and bring meter to follow-up appointment in 2 weeks with Dr. Raymondo BandKoval  HYPOTHYROIDISM, POSTSURGICAL  Continue Synthroid 125 daily.  Recommended not taking this with breakfast that she eats cereal (Milk w/ Ca2+) - check TSH  DM type 2 (diabetes mellitus, type 2) (HCC)  Again, recommended Increasing metformin to 1000 mg twice a day -  Continue glipizide 10 mg daily -  Advised to check blood sugars  every morning and 2 hour postprandial - f/u with Dr Raymondo BandKoval in 2 weeks - Check Lipid panel

## 2016-02-05 NOTE — Patient Instructions (Signed)
It was great seeing you today.   1. Diabetes 1.  I recommend you call and schedule appointment with the diabetes educator 2.  Continue checking your blood sugar every morning.  3.  also check your blood sugar 2 hours after your Meals 4.  Increase metformin to 1000 mg 2 pills twice a day 5.  Bring your meter to your next appointment 2. High blood pressure 1.  continue taking your blood pressure 1-2 times a day at home and bring her meter to your next appointment 2.  Increase lisinopril to 10 mg once a day 3. Thyroid 1.  Take your levothyroxine on an empty stomach without other medications and do not eat within 30 minutes of taking the medication   Please bring all your medications to every doctors visit  Sign up for My Chart to have easy access to your labs results, and communication with your Primary care physician.  Next Appointment  Make appointment with Dr Raymondo BandKoval in 2 weeks for diabetes and blood pressure  Please call to make an appointment with Dr Gayla DossJoyner in 1 month   I look forward to talking with you again at our next visit. If you have any questions or concerns before then, please call the clinic at [phone].  Take Care,   Dr Wenda LowJames Johnpaul Gillentine

## 2016-02-05 NOTE — Assessment & Plan Note (Signed)
Again, recommended Increasing metformin to 1000 mg twice a day -  Continue glipizide 10 mg daily -  Advised to check blood sugars every morning and 2 hour postprandial - f/u with Dr Raymondo BandKoval in 2 weeks - Check Lipid panel

## 2016-02-05 NOTE — Assessment & Plan Note (Signed)
Continue Synthroid 125 daily.  Recommended not taking this with breakfast that she eats cereal (Milk w/ Ca2+) - check TSH

## 2016-02-06 ENCOUNTER — Encounter: Payer: Self-pay | Admitting: Family Medicine

## 2016-02-20 ENCOUNTER — Ambulatory Visit: Payer: BLUE CROSS/BLUE SHIELD | Admitting: Pharmacist

## 2016-02-21 ENCOUNTER — Other Ambulatory Visit: Payer: Self-pay | Admitting: Family Medicine

## 2016-02-23 ENCOUNTER — Ambulatory Visit (INDEPENDENT_AMBULATORY_CARE_PROVIDER_SITE_OTHER): Payer: BLUE CROSS/BLUE SHIELD | Admitting: Obstetrics and Gynecology

## 2016-02-23 ENCOUNTER — Encounter: Payer: Self-pay | Admitting: Pharmacist

## 2016-02-23 ENCOUNTER — Encounter: Payer: Self-pay | Admitting: Obstetrics and Gynecology

## 2016-02-23 ENCOUNTER — Ambulatory Visit (INDEPENDENT_AMBULATORY_CARE_PROVIDER_SITE_OTHER): Payer: BLUE CROSS/BLUE SHIELD | Admitting: Pharmacist

## 2016-02-23 VITALS — BP 160/99 | HR 74 | Temp 98.1°F | Wt 237.0 lb

## 2016-02-23 VITALS — BP 146/85 | HR 77 | Wt 237.0 lb

## 2016-02-23 DIAGNOSIS — E1122 Type 2 diabetes mellitus with diabetic chronic kidney disease: Secondary | ICD-10-CM

## 2016-02-23 DIAGNOSIS — M7582 Other shoulder lesions, left shoulder: Secondary | ICD-10-CM

## 2016-02-23 DIAGNOSIS — I10 Essential (primary) hypertension: Secondary | ICD-10-CM | POA: Diagnosis not present

## 2016-02-23 MED ORDER — IBUPROFEN 600 MG PO TABS: 600.0000 mg | ORAL_TABLET | Freq: Three times a day (TID) | ORAL | Status: DC | PRN

## 2016-02-23 MED ORDER — CRADLE ARM SLING MISC: Status: DC

## 2016-02-23 MED ORDER — GLIPIZIDE 10 MG PO TABS: 10.0000 mg | ORAL_TABLET | Freq: Two times a day (BID) | ORAL | Status: AC

## 2016-02-23 NOTE — Assessment & Plan Note (Signed)
Hypertension longstanding currently uncontrolled.  Patient reports adherence with medication. Control is suboptimal likely due to stress. Lisinopril was increased at last PCP appointment. Will continue this and reassess at next pharmacy clinic appointment

## 2016-02-23 NOTE — Assessment & Plan Note (Signed)
Diabetes newly diagnosed currently uncontrolled. Patient denies hypoglycemic events and is able to verbalize appropriate hypoglycemia management plan. Patient reports adherence with medication. Control is suboptimal due to subtherapeutic medication regimen and social stressors. Increased glipizide from 10mg  once a day to 10mg  bid. Encouraged the patient to try to check blood sugar at least once a day, exercise, and continue to work on her diet. Next A1C anticipated April 2017.   Provided lancets for CBG checking.

## 2016-02-23 NOTE — Patient Instructions (Addendum)
Please avoid heavy lifting at this time as muscle are inflamed. Will contact your husbands PCP about home health aide to help with lifting and moving.   Wear sling and use medication as needed  See below for other details  Follow-up in 2 weeks if not improved   Rotator Cuff Tendinitis Rotator cuff tendinitis is inflammation of the tough, cord-like bands that connect muscle to bone (tendons) in your rotator cuff. Your rotator cuff is the collection of all the muscles and tendons that connect your arm to your shoulder. Your rotator cuff holds the head of your upper arm bone (humerus) in the cup (fossa) of your shoulder blade (scapula). CAUSES Rotator cuff tendinitis is usually caused by overusing the joint involved.  SIGNS AND SYMPTOMS  Deep ache in the shoulder also felt on the outside upper arm over the shoulder muscle.  Point tenderness over the area that is injured.  Pain comes on gradually and becomes worse with lifting the arm to the side (abduction) or turning it inward (internal rotation).  May lead to a chronic tear: When a rotator cuff tendon becomes inflamed, it runs the risk of losing its blood supply, causing some tendon fibers to die. This increases the risk that the tendon can fray and partially or completely tear. DIAGNOSIS Rotator cuff tendinitis is diagnosed by taking a medical history, performing a physical exam, and reviewing results of imaging exams. The medical history is useful to help determine the type of rotator cuff injury. The physical exam will include looking at the injured shoulder, feeling the injured area, and watching you do range-of-motion exercises. X-ray exams are typically done to rule out other causes of shoulder pain, such as fractures. MRI is the imaging exam usually used for significant shoulder injuries. Sometimes a dye study called CT arthrogram is done, but it is not as widely used as MRI. In some institutions, special ultrasound tests may also be  used to aid in the diagnosis. TREATMENT  Less Severe Cases  Use of a sling to rest the shoulder for a short period of time. Prolonged use of the sling can cause stiffness, weakness, and loss of motion of the shoulder joint.  Anti-inflammatory medicines, such as ibuprofen or naproxen sodium, may be prescribed. More Severe Cases  Physical therapy.  Use of steroid injections into the shoulder joint.  Surgery. HOME CARE INSTRUCTIONS   Use a sling or splint until the pain decreases. Prolonged use of the sling can cause stiffness, weakness, and loss of motion of the shoulder joint.  Apply ice to the injured area:  Put ice in a plastic bag.  Place a towel between your skin and the bag.  Leave the ice on for 20 minutes, 2-3 times a day.  Try to avoid use other than gentle range of motion while your shoulder is painful. Use the shoulder and exercise only as directed by your health care provider. Stop exercises or range of motion if pain or discomfort increases, unless directed otherwise by your health care provider.  Only take over-the-counter or prescription medicines for pain, discomfort, or fever as directed by your health care provider.  If you were given a shoulder sling and straps (immobilizer), do not remove it except as directed, or until you see a health care provider for a follow-up exam. If you need to remove it, move your arm as little as possible or as directed.  You may want to sleep on several pillows at night to lessen swelling and pain.  SEEK IMMEDIATE MEDICAL CARE IF:   Your shoulder pain increases or new pain develops in your arm, hand, or fingers and is not relieved with medicines.  You have new, unexplained symptoms, especially increased numbness in the hands or loss of strength.  You develop any worsening of the problems that brought you in for care.  Your arm, hand, or fingers are numb or tingling.  Your arm, hand, or fingers are swollen, painful, or turn white  or blue. MAKE SURE YOU:  Understand these instructions.  Will watch your condition.  Will get help right away if you are not doing well or get worse.   This information is not intended to replace advice given to you by your health care provider. Make sure you discuss any questions you have with your health care provider.   Document Released: 02/08/2004 Document Revised: 12/09/2014 Document Reviewed: 06/30/2013 Elsevier Interactive Patient Education [employer]

## 2016-02-23 NOTE — Progress Notes (Signed)
Subjective:   Patient ID: Jenna [name], female    DOB: 1961/10/22, 55 y.o.   MRN: [id number]  Patient presents for Same Day Appointment  Chief Complaint  Patient presents with  . Shoulder Pain    Left x 1 wk no injuries/falls    HPI: Shoulder Pain: Patient complaints of left shoulder pain. This is evaluated as a personal injury. The pain is described as aching, sharp and shooting.  The onset of the pain was gradual, starting about 1 week ago.  The pain occurs continuously and worsened by activity. Location is anterior, posterior, axillary. No history of dislocation. Symptoms are aggravated by work at or above shoulder height. Symptoms are diminished by  rest. Of note, patient has been primary caregiver for husband who recently had BKA and also recent diagnosis of terminal cancer. Patient has been doing a lot of lifting and transferring patient. She is right hand dominant. Denies injuries or falls. Has been using OTC Aleve for pain relief.   Symptoms Redness: no Stiffness: mild Swelling: no Fever: no Weakness: yes Rash: no   Past Medical History  Diagnosis Date  . Hypertension   . Thyroid disease   . Seizures [facility])    Past Surgical History  Procedure Laterality Date  . Total thyroidectomy  2010    primary hyperparathyroidism  . Parathyroidectomy  2010    primary hyperparpthyroidism  . Uterine ablation  2009    menorrhagia and severe anemia    Current outpatient prescriptions:  .  amLODipine (NORVASC) 10 MG tablet, Take 1 tablet (10 mg total) by mouth daily., Disp: 90 tablet, Rfl: 1 .  aspirin EC 81 MG EC tablet, Take 1 tablet (81 mg total) by mouth daily., Disp: 30 tablet, Rfl: 0 .  Blood Glucose Monitoring Suppl (ONE TOUCH ULTRA 2) w/Device KIT, 1 Device by Does not apply route once., Disp: 1 each, Rfl: 0 .  divalproex (DEPAKOTE) 250 MG DR tablet, TAKE ONE TABLET BY MOUTH TWICE DAILY, Disp: 60 tablet, Rfl: 0 .  glipiZIDE (GLUCOTROL) 10 MG tablet, Take 1 tablet (10 mg  total) by mouth 2 (two) times daily with a meal., Disp: 60 tablet, Rfl: 2 .  glucose blood test strip, Checked her blood sugar up to 3 times a day as needed, Disp: 100 each, Rfl: 12 .  Lancets (ONETOUCH ULTRASOFT) lancets, Checked her blood sugar up to 3 times a day as needed, Disp: 100 each, Rfl: 12 .  levothyroxine (SYNTHROID, LEVOTHROID) 125 MCG tablet, TAKE ONE TABLET BY MOUTH ONCE DAILY BEFORE  BREAKFAST, Disp: 90 tablet, Rfl: 0 .  lisinopril (PRINIVIL,ZESTRIL) 10 MG tablet, Take 1 tablet (10 mg total) by mouth daily., Disp: 32 tablet, Rfl: 1 .  metFORMIN (GLUCOPHAGE) 500 MG tablet, Take 2 tablets (1,000 mg total) by mouth 2 (two) times daily with a meal., Disp: 120 tablet, Rfl: 1  Allergies  Allergen Reactions  . Tramadol Other (See Comments)    triggers seizures and dizziness  . Cyclobenzaprine Other (See Comments)    dizziness   Reports that she quit smoking about 5 years ago. Her smoking use included Cigarettes. She does not have any smokeless tobacco history on file. She reports that she does not drink alcohol or use illicit drugs.  Past medical history, surgical, family, and social history reviewed and updated in the EMR as appropriate.  Objective:  BP 150/88 mmHg  Pulse 73  Temp(Src) 98.1 F (36.7 C) (Oral)  Wt 237 lb (107.502 kg) Vitals and nursing note reviewed  Physical Exam  L. Shoulder: Inspection reveals no abnormalities, atrophy or asymmetry. Tenderness to palpation along tender points anteriorly and posteriorly ROM is limited in all planes beyond 90 degrees Rotator cuff strength decreased throughout Signs of impingement with positive Neer and Hawkin's tests, empty can. Speeds and Yergason's tests normal. Normal scapular function observed. No apprehension sign.   Assessment & Plan:  1. Rotator cuff tendinitis, left Rotator cuff tendinitis on the left most likely secondary to overuse. 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 Discussed conservative measures with patient including rest, arm icing, arm sling. Rx for ibuprofen given. Patient to return to clinic if she would like to try steroid injection. Handout given.   Meds ordered this encounter  Medications  . Elastic Bandages & Supports (CRADLE ARM SLING) MISC    Sig: Use sling on arm as discussed with provider.    Dispense:  1 each    Refill:  0  . ibuprofen (ADVIL,MOTRIN) 600 MG tablet    Sig: Take 1 tablet (600 mg total) by mouth every 8 (eight) hours as needed.    Dispense:  30 tablet    Refill:  Pryor, DO 02/23/2016, 10:17 AM PGY-2, [place]

## 2016-02-23 NOTE — Patient Instructions (Addendum)
Thank you for coming to your appointment today!  Start taking your glipizide 10 mg tablet twice a day, 1 tablet in the morning and 1 tablet in the evening. Try to check blood sugar at least once a day. Please bring your blood sugar numbers with you to your next appointment.  Continue to work out and work on [employer] diet!  Follow-up with Pharmacy Clinic in one month.

## 2016-02-23 NOTE — Progress Notes (Signed)
S:    Patient arrives to clinic today feeling down and emotional d/t recent news of her husband having cancer. Patient reports that in general March is a hard month because it is the anniversary of many family deaths. Presents for diabetes evaluation, education, and management at the request of Dr. Gayla DossJoyner. Patient was referred on 02/05/16.  Patient was last seen by Primary Care Provider on 02/05/16.   Patient reports Diabetes was diagnosed this year.   Patient reports adherence with medications.  Current diabetes medications include: Current hypertension medications include:   Patient denies hypoglycemic events.  Patient reported dietary habits: Eats 2 meals/day Breakfast: eggs, bacon or sausage egg biscuit  Dinner: greens, chicken Snacks: fruit cups, jello Drinks: diet soda, water, and tea, more of the soda Patient reports she would like to work more on her diet.  Patient reported exercise habits: 2 days a week exercise with a trainer.   Patient reports nocturia.  Patient reports neuropathy. In her feet Patient denies visual changes. Patient reports self foot exams.    O:  Lab Results  Component Value Date   HGBA1C 11.7* 12/17/2015   Filed Vitals:   02/23/16 0920  BP: 146/85  Pulse: 77    Home CBG: pt reports elevated readings with no low readings   A/P: Diabetes newly diagnosed currently uncontrolled. Patient denies hypoglycemic events and is able to verbalize appropriate hypoglycemia management plan. Patient reports adherence with medication. Control is suboptimal due to subtherapeutic medication regimen and social stressors. Increased glipizide from 10mg  once a day to 10mg  bid. Encouraged the patient to try to check blood sugar at least once a day, exercise, and continue to work on her diet. Next A1C anticipated April 2017.    Hypertension longstanding currently uncontrolled.  Patient reports adherence with medication. Control is suboptimal likely due to stress. Lisinopril  was increased at last PCP appointment. Will continue this and reassess at next pharmacy clinic appointment  Written patient instructions provided.  Total time in face to face counseling 35 minutes.   Follow up in Pharmacist Clinic Visit in one month.   Patient seen with Philmore PaliPatrick Kurunwune, PharmD Candidate, and Lisette GrinderAlyson Leonard, PharmD Resident.

## 2016-02-23 NOTE — Progress Notes (Signed)
Patient ID: Jenna [name], female   DOB: [birth year], 55 y.o.   MRN: [id number] Reviewed: Agree with Dr. Macky LowerKoval's documentation and management.

## 2016-03-07 ENCOUNTER — Emergency Department (HOSPITAL_COMMUNITY)
Admission: EM | Admit: 2016-03-07 | Discharge: 2016-03-07 | Disposition: A | Discharge status: Home / Self Care | Payer: BLUE CROSS/BLUE SHIELD | Attending: Emergency Medicine | Admitting: Emergency Medicine

## 2016-03-07 ENCOUNTER — Encounter (HOSPITAL_COMMUNITY): Payer: Self-pay | Admitting: Emergency Medicine

## 2016-03-07 DIAGNOSIS — I10 Essential (primary) hypertension: Secondary | ICD-10-CM | POA: Insufficient documentation

## 2016-03-07 DIAGNOSIS — M79602 Pain in left arm: Secondary | ICD-10-CM

## 2016-03-07 DIAGNOSIS — Z79899 Other long term (current) drug therapy: Secondary | ICD-10-CM | POA: Diagnosis not present

## 2016-03-07 DIAGNOSIS — Z87891 Personal history of nicotine dependence: Secondary | ICD-10-CM | POA: Insufficient documentation

## 2016-03-07 DIAGNOSIS — M25522 Pain in left elbow: Secondary | ICD-10-CM | POA: Diagnosis present

## 2016-03-07 DIAGNOSIS — R61 Generalized hyperhidrosis: Secondary | ICD-10-CM | POA: Insufficient documentation

## 2016-03-07 DIAGNOSIS — Z7984 Long term (current) use of oral hypoglycemic drugs: Secondary | ICD-10-CM | POA: Diagnosis not present

## 2016-03-07 DIAGNOSIS — M25512 Pain in left shoulder: Secondary | ICD-10-CM | POA: Insufficient documentation

## 2016-03-07 DIAGNOSIS — E079 Disorder of thyroid, unspecified: Secondary | ICD-10-CM | POA: Insufficient documentation

## 2016-03-07 DIAGNOSIS — E119 Type 2 diabetes mellitus without complications: Secondary | ICD-10-CM | POA: Insufficient documentation

## 2016-03-07 DIAGNOSIS — Z7982 Long term (current) use of aspirin: Secondary | ICD-10-CM | POA: Diagnosis not present

## 2016-03-07 MED ORDER — METHOCARBAMOL 500 MG PO TABS: 500.0000 mg | ORAL_TABLET | Freq: Two times a day (BID) | ORAL | Status: DC

## 2016-03-07 MED ORDER — ACETAMINOPHEN 500 MG PO TABS: 500.0000 mg | ORAL_TABLET | Freq: Four times a day (QID) | ORAL | Status: DC | PRN

## 2016-03-07 NOTE — ED Notes (Signed)
Pt reports left arm pain from helping taking care of her husband. Pt alert x4. NAD at this time.

## 2016-03-07 NOTE — ED Notes (Signed)
C/o left shoulder and left elbow pain. No known injury. [facility]

## 2016-03-07 NOTE — Discharge Instructions (Signed)

## 2016-03-07 NOTE — ED Provider Notes (Signed)
CSN: [id number]     Arrival date & time 03/07/16  1307 History  By signing my name below, I, Stephania Fragmin, attest that this documentation has been prepared under the direction and in the presence of Domenic Moras, PA-C. Electronically Signed: Stephania Fragmin, ED Scribe. 03/07/2016. 2:58 PM.    Chief Complaint  Patient presents with  . Arm Pain   The history is provided by the patient. No language interpreter was used.    HPI Comments: Jenna [name] is a 55 y.o. female with a history of NIDDM, hypertension, thyroid disease, and seizures, who presents to the Emergency Department complaining of atraumatic, gradual-onset, constant, aching left shoulder that began 3 weeks ago and left elbow pain that is worse with movement. Patient is right-hand-dominant. 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 She states she has taken ibuprofen, with mild relief. She denies any fever.   Past Medical History  Diagnosis Date  . Hypertension   . Thyroid disease   . Seizures [facility])    Past Surgical History  Procedure Laterality Date  . Total thyroidectomy  2010    primary hyperparathyroidism  . Parathyroidectomy  2010    primary hyperparpthyroidism  . Uterine ablation  2009    menorrhagia and severe anemia   No family history on file. Social History  Substance Use Topics  . Smoking status: Former Smoker    Types: Cigarettes    Quit date: 06/13/2010  . Smokeless tobacco: None  . Alcohol Use: No   OB History    No data available     Review of Systems  Constitutional: Positive for diaphoresis. Negative for fever.  Musculoskeletal: Positive for arthralgias (left shoulder and left elbow).    Allergies  Tramadol and Cyclobenzaprine  Home Medications   Prior to Admission medications   Medication Sig Start Date End Date Taking? Authorizing Provider  amLODipine (NORVASC) 10 MG tablet Take 1 tablet (10 mg total) by mouth daily. 12/18/15    Mercy Riding, MD  aspirin EC 81 MG EC tablet Take 1 tablet (81 mg total) by mouth daily. 12/18/15   Ashly Windell Moulding, DO  Blood Glucose Monitoring Suppl (ONE TOUCH ULTRA 2) w/Device KIT 1 Device by Does not apply route once. 12/21/15   Olam Idler, MD  divalproex (DEPAKOTE) 250 MG DR tablet TAKE ONE TABLET BY MOUTH TWICE DAILY 01/16/16   Olam Idler, MD  Elastic Bandages & Supports (CRADLE ARM Waka) MISC Use sling on arm as discussed with provider. 02/23/16   Katheren Shams, DO  glipiZIDE (GLUCOTROL) 10 MG tablet Take 1 tablet (10 mg total) by mouth 2 (two) times daily with a meal. 02/23/16   Zenia Resides, MD  glucose blood test strip Checked her blood sugar up to 3 times a day as needed 12/21/15   Olam Idler, MD  ibuprofen (ADVIL,MOTRIN) 600 MG tablet Take 1 tablet (600 mg total) by mouth every 8 (eight) hours as needed. 02/23/16   Katheren Shams, DO  Lancets (ONETOUCH ULTRASOFT) lancets Checked her blood sugar up to 3 times a day as needed 12/21/15   Olam Idler, MD  levothyroxine (SYNTHROID, LEVOTHROID) 125 MCG tablet TAKE ONE TABLET BY MOUTH ONCE DAILY BEFORE  BREAKFAST 02/21/16   Olam Idler, MD  lisinopril (PRINIVIL,ZESTRIL) 10 MG tablet Take 1 tablet (10 mg total) by mouth daily. 02/05/16   Olam Idler, MD  metFORMIN (GLUCOPHAGE) 500 MG tablet  Take 2 tablets (1,000 mg total) by mouth 2 (two) times daily with a meal. 02/05/16   Olam Idler, MD   BP 162/102 mmHg  Pulse 79  Temp(Src) 98.8 F (37.1 C) (Oral)  Resp 18  SpO2 95% Physical Exam  Constitutional: She is oriented to person, place, and time. She appears well-developed and well-nourished. No distress.  HENT:  Head: Normocephalic and atraumatic.  Eyes: Conjunctivae and EOM are normal.  Neck: Neck supple. No tracheal deviation present.  Cardiovascular: Normal rate.   Pulmonary/Chest: Effort normal. No respiratory distress.  Musculoskeletal: She exhibits tenderness.  No significant midline tenderness, crepitus, or  step-offs. Tenderness to left trapezius muscle on palpation and left lateral deltoid. Tenderness to the epicondyle of the left elbow. Pain with left elbow flexion and extension; however, maintains FROM. No overlying skin changes. No crepitus. No obvious deformity. Radial pulses 2+. Forearm compartment soft. No rash. Decreased left shoulder flexion, extension, and adduction  Neurological: She is alert and oriented to person, place, and time.  Skin: Skin is warm and dry.  Psychiatric: She has a normal mood and affect. Her behavior is normal.  Nursing note and vitals reviewed.   ED Course  Procedures (including critical care time)  DIAGNOSTIC STUDIES: Oxygen Saturation is 95% on RA, adequate by my interpretation.    COORDINATION OF CARE: 2:58 PM - Suspect beginnings of arthritis. Low suspicion for ACS, radiculopathy, infectious joints or acute fx/dislocation.  Discussed treatment plan with pt at bedside which includes IcyHot, Tylenol, gentle massage, and regular rotation exercises. Will give orthopedic referral for follow-up for patient to use as needed. Pt verbalized understanding and agreed to plan.    MDM   Final diagnoses:  Left arm pain   BP 162/102 mmHg  Pulse 79  Temp(Src) 98.8 F (37.1 C) (Oral)  Resp 18  SpO2 95%  I personally performed the services described in this documentation, which was scribed in my presence. The recorded information has been reviewed and is accurate.       Domenic Moras, PA-C 03/07/16 [place], MD 03/08/16 [phone]

## 2016-04-23 ENCOUNTER — Ambulatory Visit (INDEPENDENT_AMBULATORY_CARE_PROVIDER_SITE_OTHER): Payer: BLUE CROSS/BLUE SHIELD | Admitting: Family Medicine

## 2016-04-23 ENCOUNTER — Encounter: Payer: Self-pay | Admitting: Family Medicine

## 2016-04-23 VITALS — BP 154/86 | HR 78 | Temp 98.4°F | Ht 64.0 in | Wt 240.0 lb

## 2016-04-23 DIAGNOSIS — I10 Essential (primary) hypertension: Secondary | ICD-10-CM

## 2016-04-23 DIAGNOSIS — F4321 Adjustment disorder with depressed mood: Secondary | ICD-10-CM | POA: Diagnosis not present

## 2016-04-23 DIAGNOSIS — R569 Unspecified convulsions: Secondary | ICD-10-CM | POA: Diagnosis not present

## 2016-04-23 DIAGNOSIS — E890 Postprocedural hypothyroidism: Secondary | ICD-10-CM | POA: Diagnosis not present

## 2016-04-23 MED ORDER — LISINOPRIL 10 MG PO TABS: 10.0000 mg | ORAL_TABLET | Freq: Every day | ORAL | Status: DC

## 2016-04-23 NOTE — Patient Instructions (Signed)
I have referred you to neurology; please let me know if you have not heard from them in the next 2 weeks.   I'm sorry you are going through such a difficult time. I know medications can be expensive. It is most important for you to take you thyroid medication and seizure medication. Please me know if you are unable to afford any of your medications.

## 2016-04-23 NOTE — Assessment & Plan Note (Signed)
BP control fair - refilled lisinopril  - f/u in 2 weeks for re-check

## 2016-04-23 NOTE — Assessment & Plan Note (Signed)
Stressed the importance of taking synthroid. Ask her to call if she has difficulties affording any of her medications.  - Met with social worker today to discuss resources.

## 2016-04-23 NOTE — Assessment & Plan Note (Signed)
Provided emotional support for the loss of her husband as well as the financial difficulties she is having now secondary to the loss of income.  - Met with social worker to discuss resources - Encourage meeting with [place], she will consider and may bring daughter to next visit.

## 2016-04-23 NOTE — Progress Notes (Signed)
  Patient name: Jenna [name] MRN [id number]  Date of birth: Jun 22, 1961  CC & HPI:  Jenna [name] is a 55 y.o. female presenting today for HTN and grieving. She is very emotional today after the passing of her husband in the past month. She finds it difficult to care for herself during this time, but says her daughter is at home and helping. She reports having two seizures since his passing. Has not been seen by neurology for several years but reports compliance with Depakote. Also reports compliance with synthroid and other medications except lisinopril. She is applying for disability as she does not have any income now that her husband is gone. Has been told his social security will not start for her until she is 57 (57yr from now).   Smoking History Noted  Objective Findings:  Vitals: BP 154/86 mmHg  Pulse 78  Temp(Src) 98.4 F (36.9 C) (Oral)  Ht '5\' 4"'$  (1.626 m)  Wt 240 lb (108.863 kg)  BMI 41.18 kg/m2  Gen: NAD; Obese CV: RRR w/o m/r/g, pulses +2 b/l Resp: CTAB w/ normal respiratory effort  Assessment & Plan:   HYPERTENSION, BENIGN BP control fair - refilled lisinopril  - f/u in 2 weeks for re-check  HYPOTHYROIDISM, POSTSURGICAL Stressed the importance of taking synthroid. Ask her to call if she has difficulties affording any of her medications.  - Met with social worker today to discuss resources.   Seizure Reports two seizures since husband's passing. Unclear seizures history with reports of pseudoseizures vs simple partial seizures. Reports [facility] in seizure activity [facility]  - Referred to Neurology  Grief Provided emotional support for the loss of her husband as well as the financial difficulties she is having now secondary to the loss of income.  - Met with social worker to discuss resources - Encourage meeting with [place] she will consider and may bring daughter to next visit.

## 2016-04-23 NOTE — Progress Notes (Signed)
Patient ID: Jenna [name], female   DOB: 09/30/1961, 55 y.o.   MRN: [id number]  CSW consult from Dr. Wenda LowJames Joyner due to psychosocial concerns, no income, unable to work,  getting behind with bills and grief associated with the recent death of her husband,.  Patient is tearful when talking with CSW.  Shared with CSW what she has tried as it related to the above concerns.  Patient's daughter lives with her and is her main support system.  States no concern with obtaining her medication at this time.CSW reviewed available resources and offered INT services. Patient is not interested, shared that she has information on hospice services for grief counseling.    CSW provided patient with a community resource card for local resources and reminded [facility] of [facility].   Jenna [name]. LCSWA Clinical Social Work,  [phone] 3:27 PM

## 2016-04-23 NOTE — Assessment & Plan Note (Signed)
Reports two seizures since husband's passing. Unclear seizures history with reports of pseudoseizures vs simple partial seizures. Reports improvement in seizure activity [facility].  - Referred to Neurology

## 2016-05-07 ENCOUNTER — Encounter: Payer: Self-pay | Admitting: Neurology

## 2016-05-07 ENCOUNTER — Ambulatory Visit (INDEPENDENT_AMBULATORY_CARE_PROVIDER_SITE_OTHER): Payer: BLUE CROSS/BLUE SHIELD | Admitting: Neurology

## 2016-05-07 VITALS — BP 114/86 | HR 72 | Ht 64.0 in | Wt 243.5 lb

## 2016-05-07 DIAGNOSIS — R569 Unspecified convulsions: Secondary | ICD-10-CM | POA: Diagnosis not present

## 2016-05-07 NOTE — Progress Notes (Signed)
Reason for visit: Seizures  Referring physician: Dr. Jonathon Resides Jenna [name] is a 55 y.o. female  History of present illness:  Ms. Crothers is a 55 year old right-handed black female with a history of seizures that she claims dates back about 12 years. The records indicate that the patient had a CT scan of the brain for new onset seizures in 2004. The patient indicates that she has 2 different types of seizures, she may have seizures that come out of sleep associated with generalized jerking, tongue biting and bowel and bladder incontinence. These seizures are not very frequent. She also has more frequent episodes of staring off, some with speech arrest. This occurs during the waking state. The patient will have 1 or 2 seizures a year, but her husband died on 03-21-16, and she has had 2 seizures since then. One occurred out of sleep, another while awake. The patient indicates that when she has seizures while asleep she will have soreness of her muscles afterwards, and soreness of her tongue. The patient denies any significant family history of seizures, she believes that one of her brothers may of had seizures as a child, however. She has been on low-dose Depakote. She does not operate a motor vehicle. She denies any focal numbness or weakness of the face, arms, or legs. She denies any significant balance issues. She comes to this office for an evaluation.  Past Medical History  Diagnosis Date  . Hypertension   . Thyroid disease   . Seizures ([place])   . Diabetes [facility])     Past Surgical History  Procedure Laterality Date  . Total thyroidectomy  2010    primary hyperparathyroidism  . Parathyroidectomy  2010    primary hyperparpthyroidism  . Uterine ablation  2009    menorrhagia and severe anemia    Family History  Problem Relation Age of Onset  . Diabetes Mother   . Heart attack Father   . Diabetes Brother     Social history:  reports that she quit smoking about 5 years ago. Her  smoking use included Cigarettes. She does not have any smokeless tobacco history on file. She reports that she does not drink alcohol or use illicit drugs.  Medications:  Prior to Admission medications   Medication Sig Start Date End Date Taking? Authorizing Provider  amLODipine (NORVASC) 10 MG tablet Take 1 tablet (10 mg total) by mouth daily. 12/18/15  Yes Mercy Riding, MD  aspirin EC 81 MG EC tablet Take 1 tablet (81 mg total) by mouth daily. 12/18/15  Yes Ashly M Gottschalk, DO  Blood Glucose Monitoring Suppl (ONE TOUCH ULTRA 2) w/Device KIT 1 Device by Does not apply route once. 12/21/15  Yes Olam Idler, MD  divalproex (DEPAKOTE) 250 MG DR tablet TAKE ONE TABLET BY MOUTH TWICE DAILY 01/16/16  Yes Olam Idler, MD  glipiZIDE (GLUCOTROL) 10 MG tablet Take 1 tablet (10 mg total) by mouth 2 (two) times daily with a meal. 02/23/16  Yes Zenia Resides, MD  glucose blood test strip Checked her blood sugar up to 3 times a day as needed 12/21/15  Yes Olam Idler, MD  ibuprofen (ADVIL,MOTRIN) 600 MG tablet Take 1 tablet (600 mg total) by mouth every 8 (eight) hours as needed. 02/23/16  Yes Katheren Shams, DO  Lancets (ONETOUCH ULTRASOFT) lancets Checked her blood sugar up to 3 times a day as needed 12/21/15  Yes Olam Idler, MD  levothyroxine (SYNTHROID, LEVOTHROID) 125 MCG tablet  TAKE ONE TABLET BY MOUTH ONCE DAILY BEFORE  BREAKFAST 02/21/16  Yes Olam Idler, MD  lisinopril (PRINIVIL,ZESTRIL) 10 MG tablet Take 1 tablet (10 mg total) by mouth daily. 04/23/16  Yes Olam Idler, MD  metFORMIN (GLUCOPHAGE) 500 MG tablet Take 2 tablets (1,000 mg total) by mouth 2 (two) times daily with a meal. 02/05/16  Yes Olam Idler, MD      Allergies  Allergen Reactions  . Tramadol Other (See Comments)    triggers seizures and dizziness  . Cyclobenzaprine Other (See Comments)    dizziness    ROS:  Out of a complete 14 system review of symptoms, the patient complains only of the following symptoms,  and all other reviewed systems are negative.  Weight gain Moles  Cough Blood in the stools, diarrhea  Easy bruising Feeling hot, cold, increased thirst Joint pain  Headache, dizziness, seizures Depression, not enough sleep, change in appetite    Blood pressure 114/86, pulse 72, height _0  (1.626 m), weight 243 lb 8 oz (110.451 kg).  Physical Exam  General: The patient is alert and cooperative at the time of the examination. The patient is moderately to markedly obese.   Eyes: Pupils are equal, round, and reactive to light. Discs are flat bilaterally.  Neck: The neck is supple, no carotid bruits are noted.  Respiratory: The respiratory examination is clear.  Cardiovascular: The cardiovascular examination reveals a regular rate and rhythm, no obvious murmurs or rubs are noted.  Skin: Extremities are without significant edema.  Neurologic Exam  Mental status: The patient is alert and oriented x 3 at the time of the examination. The patient has apparent normal recent and remote memory, with an apparently normal attention span and concentration ability.  Cranial nerves: Facial symmetry is present. There is good sensation of the face to pinprick and soft touch bilaterally. The strength of the facial muscles and the muscles to head turning and shoulder shrug are normal bilaterally. Speech is well enunciated, no aphasia or dysarthria is noted. Extraocular movements are full. Visual fields are full. The tongue is midline, and the patient has symmetric elevation of the soft palate. No obvious hearing deficits are noted.  Motor: The motor testing reveals 5 over 5 strength of all 4 extremities. Good symmetric motor tone is noted throughout.  Sensory: Sensory testing is intact to pinprick, soft touch, vibration sensation, and position sense on all 4 extremities. No evidence of extinction is noted.  Coordination: Cerebellar testing reveals good finger-nose-finger and heel-to-shin  bilaterally.  Gait and station: Gait is normal. Tandem gait is normal. Romberg is negative. No drift is seen.  Reflexes: Deep tendon reflexes are symmetric and normal bilaterally. Toes are downgoing bilaterally.   Assessment/Plan:  1. History of seizures  The patient has intractable seizures, but her dosing of the Depakote is quite low. The patient will undergo blood work today, the Depakote dosing will be increased if possible. The patient will have an EEG study, she is not to drive until further notice. She will follow-up through this office in 4 months.    Jill Alexanders MD 05/07/2016 7:10 PM  [facility] [address] [place], [place] [address]  Phone [phone] Fax [phone]

## 2016-05-08 ENCOUNTER — Telehealth: Payer: Self-pay | Admitting: Neurology

## 2016-05-08 LAB — COMPREHENSIVE METABOLIC PANEL
ALBUMIN: 4.3 g/dL (ref 3.5–5.5)
ALK PHOS: 75 IU/L (ref 39–117)
ALT: 15 IU/L (ref 0–32)
AST: 11 IU/L (ref 0–40)
Albumin/Globulin Ratio: 1.4 (ref 1.2–2.2)
BILIRUBIN TOTAL: 0.3 mg/dL (ref 0.0–1.2)
BUN / CREAT RATIO: 9 (ref 9–23)
BUN: 9 mg/dL (ref 6–24)
CO2: 25 mmol/L (ref 18–29)
Calcium: 9.5 mg/dL (ref 8.7–10.2)
Chloride: 101 mmol/L (ref 96–106)
Creatinine, Ser: 1.03 mg/dL — ABNORMAL HIGH (ref 0.57–1.00)
GFR calc Af Amer: 72 mL/min/{1.73_m2} (ref >59)
GFR calc non Af Amer: 62 mL/min/{1.73_m2} (ref >59)
GLUCOSE: 241 mg/dL — AB (ref 65–99)
Globulin, Total: 3 g/dL (ref 1.5–4.5)
POTASSIUM: 3.8 mmol/L (ref 3.5–5.2)
Sodium: 144 mmol/L (ref 134–144)
Total Protein: 7.3 g/dL (ref 6.0–8.5)

## 2016-05-08 LAB — CBC WITH DIFFERENTIAL/PLATELET
BASOS: 0 %
Basophils Absolute: 0 10*3/uL (ref 0.0–0.2)
EOS (ABSOLUTE): 0.1 10*3/uL (ref 0.0–0.4)
EOS: 1 %
HEMATOCRIT: 42.2 % (ref 34.0–46.6)
Hemoglobin: 14 g/dL (ref 11.1–15.9)
IMMATURE GRANS (ABS): 0 10*3/uL (ref 0.0–0.1)
IMMATURE GRANULOCYTES: 0 %
LYMPHS: 42 %
Lymphocytes Absolute: 2.9 10*3/uL (ref 0.7–3.1)
MCH: 26.3 pg — AB (ref 26.6–33.0)
MCHC: 33.2 g/dL (ref 31.5–35.7)
MCV: 79 fL (ref 79–97)
MONOS ABS: 0.4 10*3/uL (ref 0.1–0.9)
Monocytes: 6 %
NEUTROS ABS: 3.5 10*3/uL (ref 1.4–7.0)
NEUTROS PCT: 51 %
Platelets: 243 10*3/uL (ref 150–379)
RBC: 5.33 x10E6/uL — ABNORMAL HIGH (ref 3.77–5.28)
RDW: 16 % — ABNORMAL HIGH (ref 12.3–15.4)
WBC: 7 10*3/uL (ref 3.4–10.8)

## 2016-05-08 LAB — VALPROIC ACID LEVEL: Valproic Acid Lvl: <4 ug/mL — ABNORMAL LOW (ref 50–100)

## 2016-05-08 MED ORDER — DIVALPROEX SODIUM 500 MG PO DR TAB: 500.0000 mg | DELAYED_RELEASE_TABLET | Freq: Two times a day (BID) | ORAL | Status: AC

## 2016-05-08 NOTE — Telephone Encounter (Signed)
I called patient. The blood work is unremarkable but the Depakote level is essentially 0. We will need to go up on the dose taking 500 mg twice daily, the patient will contact she has any further seizures. She is not to drive until further notice.

## 2016-05-17 ENCOUNTER — Telehealth: Payer: Self-pay | Admitting: *Deleted

## 2016-05-17 NOTE — Telephone Encounter (Signed)
Pt office note faxed to [facility] on 05/17/2016.

## 2016-06-03 ENCOUNTER — Ambulatory Visit (INDEPENDENT_AMBULATORY_CARE_PROVIDER_SITE_OTHER): Payer: BLUE CROSS/BLUE SHIELD | Admitting: Neurology

## 2016-06-03 ENCOUNTER — Telehealth: Payer: Self-pay | Admitting: Neurology

## 2016-06-03 DIAGNOSIS — R569 Unspecified convulsions: Secondary | ICD-10-CM | POA: Diagnosis not present

## 2016-06-03 NOTE — Procedures (Signed)
    History:  Jenna [name] is a 55 year old patient with a history of seizures dating back about 12 years. The patient has some seizures that come out of sleep associated with generalized jerking, but she may have other episodes associated with staring and speech arrest. The patient is being evaluated for her epilepsy.  This is a routine EEG. No skull defects are noted. Medications include Norvasc, aspirin, Depakote, glipizide, ibuprofen, Synthroid, and lisinopril.   EEG classification: Normal awake  Description of the recording: The background rhythms of this recording consists of a fairly well modulated medium amplitude alpha rhythm of 8 Hz that is reactive to eye opening and closure. As the record progresses, the patient appears to remain in the waking state throughout the recording. Photic stimulation was performed, resulting in a bilateral and symmetric photic driving response. Hyperventilation was also performed, resulting in a minimal buildup of the background rhythm activities without significant slowing seen. At no time during the recording does there appear to be evidence of spike or spike wave discharges or evidence of focal slowing. EKG monitor shows no evidence of cardiac rhythm abnormalities with a heart rate of 78.  Impression: This is a normal EEG recording in the waking state. No evidence of ictal or interictal discharges are seen.

## 2016-06-03 NOTE — Telephone Encounter (Signed)
I called the patient. The EEG study was normal. The patient has been increased on the Depakote, if she is still having seizure episodes, she is to call our office so that we may make more adjustments on the dosing.

## 2016-06-09 ENCOUNTER — Encounter (HOSPITAL_COMMUNITY): Payer: Self-pay | Admitting: Emergency Medicine

## 2016-06-09 ENCOUNTER — Emergency Department (HOSPITAL_COMMUNITY): Payer: BLUE CROSS/BLUE SHIELD

## 2016-06-09 ENCOUNTER — Emergency Department (HOSPITAL_COMMUNITY)
Admission: EM | Admit: 2016-06-09 | Discharge: 2016-06-09 | Disposition: A | Discharge status: Home / Self Care | Payer: BLUE CROSS/BLUE SHIELD | Attending: Emergency Medicine | Admitting: Emergency Medicine

## 2016-06-09 DIAGNOSIS — R0789 Other chest pain: Secondary | ICD-10-CM | POA: Insufficient documentation

## 2016-06-09 DIAGNOSIS — Z87891 Personal history of nicotine dependence: Secondary | ICD-10-CM | POA: Diagnosis not present

## 2016-06-09 DIAGNOSIS — Z79899 Other long term (current) drug therapy: Secondary | ICD-10-CM | POA: Insufficient documentation

## 2016-06-09 DIAGNOSIS — Z7984 Long term (current) use of oral hypoglycemic drugs: Secondary | ICD-10-CM | POA: Diagnosis not present

## 2016-06-09 DIAGNOSIS — E119 Type 2 diabetes mellitus without complications: Secondary | ICD-10-CM | POA: Diagnosis not present

## 2016-06-09 DIAGNOSIS — Z7982 Long term (current) use of aspirin: Secondary | ICD-10-CM | POA: Insufficient documentation

## 2016-06-09 DIAGNOSIS — I10 Essential (primary) hypertension: Secondary | ICD-10-CM | POA: Diagnosis not present

## 2016-06-09 LAB — BASIC METABOLIC PANEL
ANION GAP: 8 (ref 5–15)
BUN: 10 mg/dL (ref 6–20)
CHLORIDE: 104 mmol/L (ref 101–111)
CO2: 27 mmol/L (ref 22–32)
Calcium: 9.1 mg/dL (ref 8.9–10.3)
Creatinine, Ser: 1.05 mg/dL — ABNORMAL HIGH (ref 0.44–1.00)
GFR calc non Af Amer: 60 mL/min — ABNORMAL LOW (ref >60)
GLUCOSE: 287 mg/dL — AB (ref 65–99)
POTASSIUM: 3.4 mmol/L — AB (ref 3.5–5.1)
Sodium: 139 mmol/L (ref 135–145)

## 2016-06-09 LAB — CBC
HEMATOCRIT: 39.7 % (ref 36.0–46.0)
HEMOGLOBIN: 13.4 g/dL (ref 12.0–15.0)
MCH: 26.4 pg (ref 26.0–34.0)
MCHC: 33.8 g/dL (ref 30.0–36.0)
MCV: 78.1 fL (ref 78.0–100.0)
Platelets: 206 10*3/uL (ref 150–400)
RBC: 5.08 MIL/uL (ref 3.87–5.11)
RDW: 15.3 % (ref 11.5–15.5)
WBC: 6.8 10*3/uL (ref 4.0–10.5)

## 2016-06-09 LAB — I-STAT TROPONIN, ED: TROPONIN I, POC: 0 ng/mL (ref 0.00–0.08)

## 2016-06-09 MED ORDER — METHOCARBAMOL 500 MG PO TABS: 500.0000 mg | ORAL_TABLET | Freq: Two times a day (BID) | ORAL | Status: AC

## 2016-06-09 MED ORDER — IBUPROFEN 400 MG PO TABS: 400.0000 mg | ORAL_TABLET | Freq: Four times a day (QID) | ORAL | Status: AC | PRN

## 2016-06-09 MED ORDER — KETOROLAC TROMETHAMINE 30 MG/ML IJ SOLN
15.0000 mg | Freq: Once | INTRAMUSCULAR | Status: AC
  Administered 2016-06-09: 15 mg via INTRAVENOUS
  Filled 2016-06-09: qty 1

## 2016-06-09 MED ORDER — LORAZEPAM 2 MG/ML IJ SOLN
0.5000 mg | Freq: Once | INTRAMUSCULAR | Status: AC
  Administered 2016-06-09: 0.5 mg via INTRAVENOUS
  Filled 2016-06-09: qty 1

## 2016-06-09 NOTE — ED Provider Notes (Signed)
4 days of centralized chest pain that radiates intermittently to left axilla and to her back. She describes the pain as burning. No further workup/evaluation was done because patient refused to be seen by this provider because this provider is "a young doctor." She states "[facility]. [facility]."  Jenna KrebsSamantha Nicole Shravya Wickwire, PA-C 06/09/16 1605  Vanetta MuldersScott Zackowski, MD 06/12/16 1355

## 2016-06-09 NOTE — ED Provider Notes (Signed)
CSN: [id number]     Arrival date & time 06/09/16  1445 History   First MD Initiated Contact with Patient 06/09/16 1542     Chief Complaint  Patient presents with  . Chest Pain     (Consider location/radiation/quality/duration/timing/severity/associated sxs/prior Treatment) HPI Comments: Patient here complaining of 4 days of left-sided pinpoint chest pain that has been intermittent and lasting for seconds to minutes. No associated fever, cough, diaphoresis, dyspnea. Pain is better with certain positions. No palpitations or leg pain. No pleuritic component to this. Pain feels like a burning at times. Nothing tried for this prior to arrival.  Patient is a 55 y.o. female presenting with chest pain. The history is provided by the patient and a relative.  Chest Pain   Past Medical History  Diagnosis Date  . Hypertension   . Thyroid disease   . Seizures ([place])   . Diabetes [facility])    Past Surgical History  Procedure Laterality Date  . Total thyroidectomy  2010    primary hyperparathyroidism  . Parathyroidectomy  2010    primary hyperparpthyroidism  . Uterine ablation  2009    menorrhagia and severe anemia   Family History  Problem Relation Age of Onset  . Diabetes Mother   . Heart attack Father   . Diabetes Brother    Social History  Substance Use Topics  . Smoking status: Former Smoker    Types: Cigarettes    Quit date: 06/13/2010  . Smokeless tobacco: None  . Alcohol Use: No   OB History    No data available     Review of Systems  Cardiovascular: Positive for chest pain.  All other systems reviewed and are negative.     Allergies  Tramadol and Cyclobenzaprine  Home Medications   Prior to Admission medications   Medication Sig Start Date End Date Taking? Authorizing Provider  amLODipine (NORVASC) 10 MG tablet Take 1 tablet (10 mg total) by mouth daily. 12/18/15  Yes Mercy Riding, MD  aspirin EC 81 MG EC tablet Take 1 tablet (81 mg total) by mouth daily. 12/18/15   Yes Ashly Windell Moulding, DO  divalproex (DEPAKOTE) 500 MG DR tablet Take 1 tablet (500 mg total) by mouth 2 (two) times daily. 05/08/16  Yes Kathrynn Ducking, MD  glipiZIDE (GLUCOTROL) 10 MG tablet Take 1 tablet (10 mg total) by mouth 2 (two) times daily with a meal. 02/23/16  Yes Zenia Resides, MD  levothyroxine (SYNTHROID, LEVOTHROID) 125 MCG tablet TAKE ONE TABLET BY MOUTH ONCE DAILY BEFORE  BREAKFAST 02/21/16  Yes Olam Idler, MD  lisinopril (PRINIVIL,ZESTRIL) 10 MG tablet Take 1 tablet (10 mg total) by mouth daily. 04/23/16  Yes Olam Idler, MD  metFORMIN (GLUCOPHAGE) 500 MG tablet Take 2 tablets (1,000 mg total) by mouth 2 (two) times daily with a meal. 02/05/16  Yes Olam Idler, MD  Blood Glucose Monitoring Suppl (ONE TOUCH ULTRA 2) w/Device KIT 1 Device by Does not apply route once. 12/21/15   Olam Idler, MD  glucose blood test strip Checked her blood sugar up to 3 times a day as needed 12/21/15   Olam Idler, MD  ibuprofen (ADVIL,MOTRIN) 600 MG tablet Take 1 tablet (600 mg total) by mouth every 8 (eight) hours as needed. Patient not taking: Reported on 06/09/2016 02/23/16   Katheren Shams, DO  Lancets [facility] ULTRASOFT) lancets Checked her blood sugar up to 3 times a day as needed 12/21/15   Olam Idler,  MD   BP 148/99 mmHg  Pulse 73  Temp(Src) 97.5 F (36.4 C) (Oral)  Resp 14  SpO2 92% Physical Exam  Constitutional: She is oriented to person, place, and time. She appears well-developed and well-nourished.  Non-toxic appearance. No distress.  HENT:  Head: Normocephalic and atraumatic.  Eyes: Conjunctivae, EOM and lids are normal. Pupils are equal, round, and reactive to light.  Neck: Normal range of motion. Neck supple. No tracheal deviation present. No thyroid mass present.  Cardiovascular: Normal rate, regular rhythm and normal heart sounds.  Exam reveals no gallop.   No murmur heard. Pulmonary/Chest: Effort normal and breath sounds normal. No stridor. No respiratory  distress. She has no decreased breath sounds. She has no wheezes. She has no rhonchi. She has no rales. She exhibits bony tenderness.    Abdominal: Soft. Normal appearance and bowel sounds are normal. She exhibits no distension. There is no tenderness. There is no rebound and no CVA tenderness.  Musculoskeletal: Normal range of motion. She exhibits no edema or tenderness.  Neurological: She is alert and oriented to person, place, and time. She has normal strength. No cranial nerve deficit or sensory deficit. GCS eye subscore is 4. GCS verbal subscore is 5. GCS motor subscore is 6.  Skin: Skin is warm and dry. No abrasion and no rash noted.  Psychiatric: She has a normal mood and affect. Her speech is normal and behavior is normal.  Nursing note and vitals reviewed.   ED Course  Procedures (including critical care time) Labs Review Labs Reviewed  BASIC METABOLIC PANEL - Abnormal; Notable for the following:    Potassium 3.4 (*)    Glucose, Bld 287 (*)    Creatinine, Ser 1.05 (*)    GFR calc non Af Amer 60 (*)    All other components within normal limits  CBC  I-STAT TROPOININ, ED    Imaging Review Dg Chest 2 View  06/09/2016  CLINICAL DATA:  55 year old female with some left-sided chest pain and shortness breath for the past 4 days. EXAM: CHEST  2 VIEW COMPARISON:  Chest x-ray 12/17/2015. FINDINGS: Linear opacities projecting over the region of the lingula, similar to the prior study from 10/17/2010, presumably areas of chronic scarring. No acute consolidative airspace disease. No pleural effusions. No evidence of pulmonary edema. Heart size is normal. Upper mediastinal contours are within normal limits. IMPRESSION: 1. No radiographic evidence of acute cardiopulmonary disease. Electronically Signed   By: Vinnie Langton M.D.   On: 06/09/2016 15:42   I have personally reviewed and evaluated these images and lab results as part of my medical decision-making.   EKG  Interpretation   Date/Time:  Sunday June 09 2016 15:30:17 EDT Ventricular Rate:  73 PR Interval:    QRS Duration: 95 QT Interval:  405 QTC Calculation: 447 R Axis:   44 Text Interpretation:  Sinus rhythm Borderline T abnormalities, anterior  leads Baseline wander in lead(s) II III aVF No significant change since  last tracing Confirmed by Osias Resnick  MD, Helmut Hennon ([id number]) on 06/09/2016 4:07:52  PM      MDM   Final diagnoses:  None    Patient with pinpoint chest pain and negative troponin here. Patients symptoms not consistent with ACS. Suspect muscular skeletal pain. Patient treated with Toradol as well as Ativan. Will follow-up with her Dr.    Lacretia Leigh, MD 06/09/16 [phone]

## 2016-06-09 NOTE — ED Notes (Signed)
Pt states that she has had central chest pain x 3-4 days. States that it feels like a 'burning.' Denies SOB. Alert and oriented.

## 2016-06-09 NOTE — Discharge Instructions (Signed)

## 2016-06-19 DIAGNOSIS — Z0271 Encounter for disability determination: Secondary | ICD-10-CM

## 2016-06-30 ENCOUNTER — Emergency Department (HOSPITAL_COMMUNITY): Payer: BLUE CROSS/BLUE SHIELD

## 2016-06-30 ENCOUNTER — Encounter (HOSPITAL_COMMUNITY): Payer: Self-pay

## 2016-06-30 ENCOUNTER — Emergency Department (HOSPITAL_COMMUNITY)
Admission: EM | Admit: 2016-06-30 | Discharge: 2016-06-30 | Disposition: A | Discharge status: Home / Self Care | Payer: BLUE CROSS/BLUE SHIELD | Attending: Emergency Medicine | Admitting: Emergency Medicine

## 2016-06-30 DIAGNOSIS — Z87891 Personal history of nicotine dependence: Secondary | ICD-10-CM | POA: Diagnosis not present

## 2016-06-30 DIAGNOSIS — Z7982 Long term (current) use of aspirin: Secondary | ICD-10-CM | POA: Diagnosis not present

## 2016-06-30 DIAGNOSIS — I10 Essential (primary) hypertension: Secondary | ICD-10-CM | POA: Diagnosis not present

## 2016-06-30 DIAGNOSIS — M79672 Pain in left foot: Secondary | ICD-10-CM | POA: Insufficient documentation

## 2016-06-30 DIAGNOSIS — E119 Type 2 diabetes mellitus without complications: Secondary | ICD-10-CM | POA: Diagnosis not present

## 2016-06-30 DIAGNOSIS — Z7984 Long term (current) use of oral hypoglycemic drugs: Secondary | ICD-10-CM | POA: Diagnosis not present

## 2016-06-30 DIAGNOSIS — Z79899 Other long term (current) drug therapy: Secondary | ICD-10-CM | POA: Insufficient documentation

## 2016-06-30 MED ORDER — IBUPROFEN 800 MG PO TABS: 800.0000 mg | ORAL_TABLET | Freq: Three times a day (TID) | ORAL | 0 refills | Status: AC

## 2016-06-30 NOTE — ED Provider Notes (Signed)
[facility] DEPT Provider Note   CSN: [id number] Arrival date & time: 06/30/16  1706  First Provider Contact:   First MD Initiated Contact with Jenna [name] 06/30/16 1923       By signing my name below, I, Hansel Feinstein, attest that this documentation has been prepared under the direction and in the presence of Jarron Curley, PA-C. Electronically Signed: Hansel Feinstein, ED Scribe. 06/30/16. 7:28 PM.    History   Chief Complaint Chief Complaint  Jenna [name] presents with  . Foot Pain    HPI Jenna [name] is a 55 y.o. female with h/o DM who presents to the Emergency Department complaining of moderate, throbbing left foot pain onset 2 days ago. Jenna [name] denies known recent injury trauma or fall, but states, "I may have stepped off my bed wrong a couple days ago." Jenna [name] states that pain is worsened with movement, ambulation and weight-bearing. No alleviating factors noted. Jenna [name] is ambulatory without difficulty. Jenna [name] states her DM is somewhat well controlled and her CBGs run in the 200s. Jenna [name] denies ankle pain, numbness, weakness, additional complaints.   The history is provided by the Jenna [name]. No language interpreter was used.    Past Medical History:  Diagnosis Date  . Diabetes ([place])   . Hypertension   . Seizures ([place])   . Thyroid disease     Jenna [name] Active Problem List   Diagnosis Date Noted  . Grief 04/23/2016  . DM type 2 (diabetes mellitus, type 2) ([place])   . Shoulder pain, right 12/14/2015  . Vaginal itching 12/14/2015  . Pain of right thumb 05/10/2015  . Abdominal pain, epigastric 05/10/2015  . Vertebral compression fracture ([place]) 12/18/2014  . Excessive cerumen in right ear canal 12/14/2014  . Knee pain 08/16/2014  . Breast pain, left 07/28/2014  . LLQ abdominal pain 07/28/2014  . Neck pain on right side 05/17/2014  . Intertrigo 11/03/2013  . Right hand pain 10/20/2012  . Health care maintenance 10/20/2012  . Seizure ([place]) 04/07/2012  . Carpal tunnel syndrome on both sides 12/06/2011  .  Plantar fasciitis 08/30/2011  . DeQuervain's disease (tenosynovitis) 04/22/2011  . Shoulder pain, left 04/19/2011  . Depressive illness 01/22/2011  . ARTHRALGIA 12/28/2010  . 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 Primary hyperparathyroidism ([place]) 05/25/2010  . HYPERTENSION, BENIGN 01/06/2009  . OBESITY, NOS 01/29/2007    Past Surgical History:  Procedure Laterality Date  . PARATHYROIDECTOMY  2010   primary hyperparpthyroidism  . TOTAL THYROIDECTOMY  2010   primary hyperparathyroidism  . uterine ablation  2009   menorrhagia and severe anemia    OB History    No data available       Home Medications    Prior to Admission medications   Medication Sig Start Date End Date Taking? Authorizing Provider  amLODipine (NORVASC) 10 MG tablet Take 1 tablet (10 mg total) by mouth daily. 12/18/15   Mercy Riding, MD  aspirin EC 81 MG EC tablet Take 1 tablet (81 mg total) by mouth daily. 12/18/15   Ashly Windell Moulding, DO  Blood Glucose Monitoring Suppl (ONE TOUCH ULTRA 2) w/Device KIT 1 Device by Does not apply route once. 12/21/15   Olam Idler, MD  divalproex (DEPAKOTE) 500 MG DR tablet Take 1 tablet (500 mg total) by mouth 2 (two) times daily. 05/08/16   Kathrynn Ducking, MD  glipiZIDE (GLUCOTROL) 10 MG tablet Take 1 tablet (10 mg total) by mouth 2 (two) times daily with a meal. 02/23/16   Zenia Resides, MD  glucose blood test strip Checked her blood sugar up to 3 times a day as needed 12/21/15   Olam Idler, MD  ibuprofen (ADVIL,MOTRIN) 400 MG tablet Take 1 tablet (400 mg total) by mouth every 6 (six) hours as needed. 06/09/16   Lacretia Leigh, MD  ibuprofen (ADVIL,MOTRIN) 800 MG tablet Take 1 tablet (800 mg total) by mouth 3 (three) times daily. 06/30/16   Nakeeta Sebastiani C Shaida Route, PA-C  Lancets (ONETOUCH ULTRASOFT) lancets Checked her blood sugar up to 3 times a day as needed 12/21/15   Olam Idler, MD  levothyroxine (SYNTHROID, LEVOTHROID) 125 MCG tablet TAKE ONE TABLET BY MOUTH ONCE DAILY  BEFORE  BREAKFAST 02/21/16   Olam Idler, MD  lisinopril (PRINIVIL,ZESTRIL) 10 MG tablet Take 1 tablet (10 mg total) by mouth daily. 04/23/16   Olam Idler, MD  metFORMIN (GLUCOPHAGE) 500 MG tablet Take 2 tablets (1,000 mg total) by mouth 2 (two) times daily with a meal. 02/05/16   Olam Idler, MD  methocarbamol (ROBAXIN) 500 MG tablet Take 1 tablet (500 mg total) by mouth 2 (two) times daily. 06/09/16   Lacretia Leigh, MD    Family History Family History  Problem Relation Age of Onset  . Diabetes Mother   . Heart attack Father   . Diabetes Brother     Social History Social History  Substance Use Topics  . Smoking status: Former Smoker    Types: Cigarettes    Quit date: 06/13/2010  . Smokeless tobacco: Not on file  . Alcohol use No     Allergies   Tramadol and Cyclobenzaprine   Review of Systems Review of Systems  Musculoskeletal: Positive for arthralgias (left foot).  Neurological: Negative for weakness and numbness.     Physical Exam Updated Vital Signs BP 165/87 (BP Location: Right Arm)   Pulse 73   Temp 98.7 F (37.1 C) (Oral)   Resp 20   SpO2 96%   Physical Exam  Constitutional: Jenna [name] appears well-developed and well-nourished.  HENT:  Head: Normocephalic.  Eyes: Conjunctivae are normal.  Neck: Neck supple.  Cardiovascular: Normal rate.   Pulmonary/Chest: Effort normal. No respiratory distress.  Musculoskeletal: Normal range of motion. Jenna [name] exhibits tenderness. Jenna [name] exhibits no edema or deformity.  Tenderness along the left lateral foot with no discernable swelling, erythema or increased warmth. ROM in the left ankle and toes is intact. No sensory deficits. Strength 5/5.   Neurological: Jenna [name] is alert.  Skin: Skin is warm and dry.  Psychiatric: Jenna [name] has a normal mood and affect. Her behavior is normal.  Nursing note and vitals reviewed.    ED Treatments / Results  Labs (all labs ordered are listed, but only abnormal results are displayed) Labs Reviewed -  No data to display  EKG  EKG Interpretation None       Radiology Dg Foot Complete Left  Result Date: 06/30/2016 CLINICAL DATA:  Left foot pain for several days, initial encounter EXAM: LEFT FOOT - COMPLETE 3+ VIEW COMPARISON:  05/16/2008 FINDINGS: There is no evidence of fracture or dislocation. No soft tissue swelling is seen. Mild tarsal degenerative changes are seen. IMPRESSION: No acute abnormality noted. Electronically Signed   By: Inez Catalina M.D.   On: 06/30/2016 17:59   Procedures Procedures (including critical care time)  DIAGNOSTIC STUDIES: Oxygen Saturation is 99% on RA, normal by my interpretation.    COORDINATION OF CARE: 7:25 PM Discussed treatment plan with Jenna [name] at bedside which includes XR, post op shoe, ice, antiinflammatories,  f/u with PCP and Jenna [name] agreed to plan.   Medications Ordered in ED Medications - No data to display   Initial Impression / Assessment and Plan / ED Course  I have reviewed the triage vital signs and the nursing notes.  Pertinent labs & imaging results that were available during my care of the Jenna [name] were reviewed by me and considered in my medical decision making (see chart for details).  Clinical Course    Wynetta Seith presents with left foot pain for the last 2 days.  Jenna [name] with possible foot sprain versus arthritic changes. No acute abnormalities on x-ray. Postop shoe and PCP follow-up. The Jenna [name] was given instructions for home care as well as return precautions. Jenna [name] voices understanding of these instructions, accepts the plan, and is comfortable with discharge.  Vitals:   06/30/16 1724 06/30/16 1800 06/30/16 1953  BP: (!) 164/112 157/92 165/87  Pulse: 78  73  Resp: 20  20  Temp: 98.7 F (37.1 C)    TempSrc: Oral    SpO2: 99%  96%     Final Clinical Impressions(s) / ED Diagnoses   Final diagnoses:  Foot pain, left    New Prescriptions Discharge Medication List as of 06/30/2016  7:41 PM    START taking  these medications   Details  !! ibuprofen (ADVIL,MOTRIN) 800 MG tablet Take 1 tablet (800 mg total) by mouth 3 (three) times daily., Starting Sun 06/30/2016, Print     !! - Potential duplicate medications found. Please discuss with provider.      I personally performed the services described in this documentation, which was scribed in my presence. The recorded information has been reviewed and is accurate.    Lorayne Bender, PA-C 06/30/16 2000    Quintella Reichert, MD 07/01/16 [phone]

## 2016-06-30 NOTE — ED Notes (Signed)
PT DISCHARGED. INSTRUCTIONS AND PRESCRIPTIONS GIVEN. AAOX4. PT IN NO APPARENT DISTRESS. THE OPPORTUNITY TO ASK QUESTIONS WAS PROVIDED. 

## 2016-06-30 NOTE — ED Notes (Signed)
Pt has not taken her bp meds today

## 2016-06-30 NOTE — Discharge Instructions (Signed)
You have been seen today for foot pain. Your imaging showed no abnormalities. Follow up with PCP as soon as possible. Use the postop shoe as needed for comfort. Follow the attached instructions for further care. Return to ED should symptoms worsen.  Ibuprofen, naproxen, or Tylenol for pain.

## 2016-06-30 NOTE — ED Triage Notes (Signed)
Pt here with left foot pain and swelling.  States has been sore for a while but worse the last 2 days.  Pt unsure of injury. Worse when walks.

## 2016-07-29 ENCOUNTER — Other Ambulatory Visit: Payer: Self-pay | Admitting: Family Medicine

## 2016-09-05 ENCOUNTER — Ambulatory Visit (INDEPENDENT_AMBULATORY_CARE_PROVIDER_SITE_OTHER): Payer: BLUE CROSS/BLUE SHIELD | Admitting: Family Medicine

## 2016-09-05 ENCOUNTER — Encounter: Payer: Self-pay | Admitting: Family Medicine

## 2016-09-05 DIAGNOSIS — E1122 Type 2 diabetes mellitus with diabetic chronic kidney disease: Secondary | ICD-10-CM

## 2016-09-05 DIAGNOSIS — M25561 Pain in right knee: Secondary | ICD-10-CM

## 2016-09-05 DIAGNOSIS — G8929 Other chronic pain: Secondary | ICD-10-CM | POA: Diagnosis not present

## 2016-09-05 LAB — POCT GLYCOSYLATED HEMOGLOBIN (HGB A1C): Hemoglobin A1C: 8.9

## 2016-09-05 MED ORDER — MELOXICAM 7.5 MG PO TABS: 7.5000 mg | ORAL_TABLET | Freq: Every day | ORAL | 0 refills | Status: AC

## 2016-09-05 NOTE — Patient Instructions (Signed)
It was great seeing you today! We have addressed the following issues today  1. We talked about your knee pain we started you on Mobic for knee pain. Use it for pain as needed. You wil also have an Xray of your right knee and we will follow up after in clinic.   If we did any lab work today, and the results require attention, either me or my nurse will get in touch with you. If everything is normal, you will get a letter in mail. If you don't hear from us in two weeks, please give us a call. Otherwise, I look forward to talking with you again at our next visit. If you have any questions or concerns before then, please call the clinic at [phone].  Please bring all your medications to every doctors visit   Sign up for My Chart to have easy access to your labs results, and communication with your Primary care physician.    Please check-out at the front desk before leaving the clinic.   Take Care,

## 2016-09-05 NOTE — Progress Notes (Signed)
   Subjective:    Patient ID: Jenna [name], female    DOB: 11-29-1961, 55 y.o.   MRN: [id number]   CC: Right knee pain   HPI: Patient is 55 yo female with a past medical history significant for HTN, DM2, hypothyroidism, arthralgia who present today with right knee pain.  Knee pain: Patient reports worsening right knee pain for the past few months. She rates the pain 10/10 and has been taking ibuprofen 400 as needed with minimal relief. Patient states that sometimes knee swell up and it is difficult for her to ambulate. She reports knee is warm to the  touch at times and stiff in the morning with decrease range of motion. Patient consider using a brace, but decided  to consult with a physician at first.    Smoking status reviewed   ROS: all other systems were reviewed and are negative other than in the HPI   Past medical history, surgical, family, and social history reviewed and updated in the EMR as appropriate.  Objective:  BP (!) 148/89   Pulse 73   Temp 98.1 F (36.7 C) (Oral)   Wt 242 lb (109.8 kg)   SpO2 93%   BMI 41.54 kg/m   Vitals and nursing note reviewed  General: NAD, pleasant, able to participate in exam Cardiac: RRR, normal heart sounds, no murmurs. 2+ radial and PT pulses bilaterally Respiratory: CTAB, normal effort, No wheezes, rales or rhonchi Abdomen: soft, nontender, nondistended, no hepatic or splenomegaly, +BS Extremities: no edema or cyanosis. WWP. Right knee with normal contour, no edema noted on exam, crepitus appreciated on active and passive flexion and extension, tender to palpation in epicondyles groove. Full ROM, gait unbalanced, slightly favoring left side. Skin: warm and dry, no rashes noted  Neuro: alert and oriented x4, no focal deficits Psych: Normal affect and mood   Assessment & Plan:    Right knee pain Patient with history of right knee, with DG of right knee in 03/2015 which showed minimal narrowing of the tibial femoral the joint  space. Patient with crepitus on exam, stiffness in the morning with some difficulty bearing weight. Inflammation noted, with minimal relief with NSAIDs. Based on clinical presentation, symptoms are consistent with arthritis of the knee. Option for managing pain were discussed with patient, however would like further imaging to appreciate progression of possible arthritis since 2016. --Order a complete DG of the right knee  --Start patient on 7.5mg  of Mobic for pain control --Will follow up in clinic after imaging, for reevalution and to discuss imaging results. --Have considered Tramadol as breakthrough pain, but patient is allergic.   Lovena NeighboursAbdoulaye Ronel Rodeheaver, MD Family Medicine Resident PGY-1

## 2016-09-06 NOTE — Assessment & Plan Note (Addendum)
Patient with history of right knee, with DG of right knee in 03/2015 which showed minimal narrowing of the tibial femoral the joint space. Patient with crepitus on exam, stiffness in the morning with some difficulty bearing weight. Inflammation noted, with minimal relief with NSAIDs. Based on clinical presentation, symptoms are consistent with arthritis of the knee. Option for managing pain were discussed with patient, however would like further imaging to appreciate progression of possible arthritis since 2016. --Order a complete DG of the right knee  --Start patient on 7.5mg  of Mobic for pain control --Will follow up in clinic after imaging, for reevalution and to discuss imaging results. --Have considered Tramadol as breakthrough pain, but patient is allergic.

## 2016-09-09 ENCOUNTER — Ambulatory Visit: Payer: BLUE CROSS/BLUE SHIELD | Admitting: Adult Health

## 2016-10-02 ENCOUNTER — Emergency Department (HOSPITAL_COMMUNITY): Payer: BLUE CROSS/BLUE SHIELD

## 2016-10-02 ENCOUNTER — Encounter (HOSPITAL_COMMUNITY): Payer: Self-pay

## 2016-10-02 ENCOUNTER — Emergency Department (HOSPITAL_COMMUNITY)
Admission: EM | Admit: 2016-10-02 | Discharge: 2016-10-02 | Disposition: A | Discharge status: Home / Self Care | Payer: BLUE CROSS/BLUE SHIELD | Attending: Emergency Medicine | Admitting: Emergency Medicine

## 2016-10-02 DIAGNOSIS — E039 Hypothyroidism, unspecified: Secondary | ICD-10-CM | POA: Insufficient documentation

## 2016-10-02 DIAGNOSIS — Z7984 Long term (current) use of oral hypoglycemic drugs: Secondary | ICD-10-CM | POA: Insufficient documentation

## 2016-10-02 DIAGNOSIS — M179 Osteoarthritis of knee, unspecified: Secondary | ICD-10-CM | POA: Insufficient documentation

## 2016-10-02 DIAGNOSIS — Z87891 Personal history of nicotine dependence: Secondary | ICD-10-CM | POA: Insufficient documentation

## 2016-10-02 DIAGNOSIS — M1711 Unilateral primary osteoarthritis, right knee: Secondary | ICD-10-CM

## 2016-10-02 DIAGNOSIS — Z7982 Long term (current) use of aspirin: Secondary | ICD-10-CM | POA: Insufficient documentation

## 2016-10-02 DIAGNOSIS — I10 Essential (primary) hypertension: Secondary | ICD-10-CM | POA: Insufficient documentation

## 2016-10-02 DIAGNOSIS — E119 Type 2 diabetes mellitus without complications: Secondary | ICD-10-CM | POA: Insufficient documentation

## 2016-10-02 DIAGNOSIS — Z79899 Other long term (current) drug therapy: Secondary | ICD-10-CM | POA: Insufficient documentation

## 2016-10-02 MED ORDER — ACETAMINOPHEN 500 MG PO TABS
1000.0000 mg | ORAL_TABLET | Freq: Once | ORAL | Status: AC
  Administered 2016-10-02: 1000 mg via ORAL
  Filled 2016-10-02: qty 2

## 2016-10-02 MED ORDER — NAPROXEN 250 MG PO TABS
500.0000 mg | ORAL_TABLET | Freq: Once | ORAL | Status: AC
  Administered 2016-10-02: 500 mg via ORAL
  Filled 2016-10-02: qty 2

## 2016-10-02 MED ORDER — NAPROXEN 500 MG PO TABS: 500.0000 mg | ORAL_TABLET | Freq: Two times a day (BID) | ORAL | 0 refills | Status: AC

## 2016-10-02 NOTE — Discharge Instructions (Signed)
Your xray shows arthritis of the knee. Take over the counter medicines for your pain. See an orthopedist for management of your knee pain.

## 2016-10-02 NOTE — ED Notes (Signed)
Pt is in stable condition upon d/c and ambulates from ED. Pt is extremely upset and states, "I guess you just don't do anything for people no more".

## 2016-10-02 NOTE — ED Provider Notes (Signed)
[facility] DEPT Provider Note   CSN: [id number] Arrival date & time: 10/02/16  1743  By signing my name below, I, Irene Pap, attest that this documentation has been prepared under the direction and in the presence of Margarita Mail, PA-C. Electronically Signed: Irene Pap, ED Scribe. 10/02/16. 7:02 PM.  History   Chief Complaint Chief Complaint  Patient presents with  . Knee Pain   The history is provided by the patient. No language interpreter was used.   HPI Comments: Bunnie Rehberg is a 55 y.o. female with a hx of DM and HTN who presents to the Emergency Department complaining of persistent, burning right knee pain onset several months ago. Pt reports associated intermittent swelling and "clicking" in the knee. She has worsening pain with weightbearing, bending the knee, and ambulation. Pt was told by her PCP to come to the ED for x-rays. Pt has taken ibuprofen 400 mg for the pain to no relief. She denies injury or trauma to the area, color change, wound, numbness, or weakness.   Review of [facility] does not show any recent prescriptions for narcotic pain medication for the patient.   Past Medical History:  Diagnosis Date  . Diabetes ([place])   . Hypertension   . Seizures ([place])   . Thyroid disease     Patient Active Problem List   Diagnosis Date Noted  . Grief 04/23/2016  . DM type 2 (diabetes mellitus, type 2) ([place])   . Shoulder pain, right 12/14/2015  . Vaginal itching 12/14/2015  . Pain of right thumb 05/10/2015  . Abdominal pain, epigastric 05/10/2015  . Vertebral compression fracture ([place]) 12/18/2014  . Excessive cerumen in right ear canal 12/14/2014  . Right knee pain 08/16/2014  . Breast pain, left 07/28/2014  . LLQ abdominal pain 07/28/2014  . Neck pain on right side 05/17/2014  . Intertrigo 11/03/2013  . Right hand pain 10/20/2012  . Health care maintenance 10/20/2012  . Seizure ([place]) 04/07/2012  . Carpal  tunnel syndrome on both sides 12/06/2011  . Plantar fasciitis 08/30/2011  . DeQuervain's disease (tenosynovitis) 04/22/2011  . Shoulder pain, left 04/19/2011  . Depressive illness 01/22/2011  . ARTHRALGIA 12/28/2010  . 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 Primary hyperparathyroidism ([place]) 05/25/2010  . HYPERTENSION, BENIGN 01/06/2009  . OBESITY, NOS 01/29/2007    Past Surgical History:  Procedure Laterality Date  . PARATHYROIDECTOMY  2010   primary hyperparpthyroidism  . TOTAL THYROIDECTOMY  2010   primary hyperparathyroidism  . uterine ablation  2009   menorrhagia and severe anemia    OB History    No data available       Home Medications    Prior to Admission medications   Medication Sig Start Date End Date Taking? Authorizing Provider  amLODipine (NORVASC) 10 MG tablet Take 1 tablet (10 mg total) by mouth daily. 12/18/15   Mercy Riding, MD  aspirin EC 81 MG EC tablet Take 1 tablet (81 mg total) by mouth daily. 12/18/15   Ashly Windell Moulding, DO  Blood Glucose Monitoring Suppl (ONE TOUCH ULTRA 2) w/Device KIT 1 Device by Does not apply route once. 12/21/15   Olam Idler, MD  divalproex (DEPAKOTE) 500 MG DR tablet Take 1 tablet (500 mg total) by mouth 2 (two) times daily. 05/08/16   Kathrynn Ducking, MD  glipiZIDE (GLUCOTROL) 10 MG tablet Take 1 tablet (10 mg total) by mouth 2 (two) times daily with a meal. 02/23/16   Zenia Resides, MD  glucose blood test strip Checked her blood sugar up to 3 times a day as needed 12/21/15   Olam Idler, MD  ibuprofen (ADVIL,MOTRIN) 400 MG tablet Take 1 tablet (400 mg total) by mouth every 6 (six) hours as needed. 06/09/16   Lacretia Leigh, MD  ibuprofen (ADVIL,MOTRIN) 800 MG tablet Take 1 tablet (800 mg total) by mouth 3 (three) times daily. 06/30/16   Shawn C Joy, PA-C  Lancets (ONETOUCH ULTRASOFT) lancets Checked her blood sugar up to 3 times a day as needed 12/21/15   Olam Idler, MD  levothyroxine (SYNTHROID, LEVOTHROID) 125 MCG  tablet TAKE ONE TABLET BY MOUTH ONCE DAILY BEFORE BREAKFAST 07/30/16   Steve Rattler, DO  lisinopril (PRINIVIL,ZESTRIL) 10 MG tablet Take 1 tablet (10 mg total) by mouth daily. 04/23/16   Olam Idler, MD  meloxicam (MOBIC) 7.5 MG tablet Take 1 tablet (7.5 mg total) by mouth daily. 09/05/16   Marjie Skiff, MD  metFORMIN (GLUCOPHAGE) 500 MG tablet Take 2 tablets (1,000 mg total) by mouth 2 (two) times daily with a meal. 02/05/16   Olam Idler, MD  methocarbamol (ROBAXIN) 500 MG tablet Take 1 tablet (500 mg total) by mouth 2 (two) times daily. 06/09/16   Lacretia Leigh, MD    Family History Family History  Problem Relation Age of Onset  . Diabetes Mother   . Heart attack Father   . Diabetes Brother     Social History Social History  Substance Use Topics  . Smoking status: Former Smoker    Types: Cigarettes    Quit date: 06/13/2010  . Smokeless tobacco: Not on file  . Alcohol use No     Allergies   Tramadol and Cyclobenzaprine   Review of Systems Review of Systems  Musculoskeletal: Positive for arthralgias and joint swelling.  Skin: Negative for color change and wound.  Neurological: Negative for weakness and numbness.   Physical Exam Updated Vital Signs BP 127/87 (BP Location: Right Arm)   Pulse 80   Temp 98.3 F (36.8 C) (Oral)   Resp 18   Wt 237 lb (107.5 kg)   SpO2 94%   BMI 40.68 kg/m   Physical Exam Physical Exam  Constitutional: Pt appears well-developed and well-nourished. No distress.  HENT:  Head: Normocephalic and atraumatic.  Eyes: Conjunctivae are normal.  Neck: Normal range of motion.  Cardiovascular: Normal rate, regular rhythm and intact distal pulses.   Capillary refill < 3 sec  Pulmonary/Chest: Effort normal and breath sounds normal.  Musculoskeletal: Pt exhibits tenderness. Pt exhibits no edema.  ROM: full ROM; ligaments are stable Neurological: Pt  is alert. Coordination normal.  Sensation 5/5 Strength 5/5 Skin: Skin is warm and dry.  Pt is not diaphoretic.  No tenting of the skin  Psychiatric: Pt has a normal mood and affect.  Nursing note and vitals reviewed.   ED Treatments / Results  DIAGNOSTIC STUDIES: Oxygen Saturation is 94% on RA, adequate by my interpretation.    COORDINATION OF CARE: 6:58 PM-Discussed treatment plan which includes xray  (CXR, CBC panel, CMP, UA) with pt at bedside and pt agreed to plan.    Labs (all labs ordered are listed, but only abnormal results are displayed) Labs Reviewed - No data to display  EKG  EKG Interpretation None       Radiology Dg Knee Complete 4 Views Right  Result Date: 10/02/2016 CLINICAL DATA:  Right knee pain for 3 months EXAM: RIGHT KNEE - COMPLETE 4+ VIEW COMPARISON:  07/23/2015 FINDINGS: No fracture or malalignment. Minimal superior and inferior patellar spurring. No large effusion. Mild narrowing of the medial compartment with spurring. IMPRESSION: Mild degenerative changes.  No acute osseous abnormality. Electronically Signed   By: Donavan Foil M.D.   On: 10/02/2016 20:03   Procedures Procedures (including critical care time)  Medications Ordered in ED Medications - No data to display   Initial Impression / Assessment and Plan / ED Course  I have reviewed the triage vital signs and the nursing notes.  Pertinent labs & imaging results that were available during my care of the patient were reviewed by me and considered in my medical decision making (see chart for details).  Clinical Course     Patient X-Ray negative for obvious fracture or dislocation. Pain managed in ED. Pt advised to follow up with orthopedics if symptoms persist. Patient given knee sleeve while in ED, conservative therapy recommended and discussed. Patient will be dc home & is agreeable with above plan.  Final Clinical Impressions(s) / ED Diagnoses   Final diagnoses:  None  I personally performed the services described in this documentation, which was scribed in my  presence. The recorded information has been reviewed and is accurate.     New Prescriptions New Prescriptions   No medications on file     Margarita Mail, PA-C 10/02/16 2013    Carmin Muskrat, MD 10/03/16 [phone]

## 2016-10-02 NOTE — ED Triage Notes (Signed)
Patient here with right knee pain for months, denies injury. Pain worse with any ambulation, states some intermittent swelling with same

## 2016-10-02 NOTE — ED Notes (Signed)
Pt is upset rt wait time and states she thought this was fast track, not slow track and she is ready to go and still in pain. Cammy CopaAbigail, PA informed.

## 2016-10-09 ENCOUNTER — Emergency Department (HOSPITAL_COMMUNITY)
Admission: EM | Admit: 2016-10-09 | Discharge: 2016-10-09 | Disposition: A | Discharge status: Home / Self Care | Payer: BLUE CROSS/BLUE SHIELD | Attending: Emergency Medicine | Admitting: Emergency Medicine

## 2016-10-09 ENCOUNTER — Encounter (HOSPITAL_COMMUNITY): Payer: Self-pay | Admitting: Emergency Medicine

## 2016-10-09 ENCOUNTER — Emergency Department (HOSPITAL_COMMUNITY): Payer: BLUE CROSS/BLUE SHIELD

## 2016-10-09 DIAGNOSIS — Y939 Activity, unspecified: Secondary | ICD-10-CM | POA: Insufficient documentation

## 2016-10-09 DIAGNOSIS — Z7982 Long term (current) use of aspirin: Secondary | ICD-10-CM | POA: Insufficient documentation

## 2016-10-09 DIAGNOSIS — Y999 Unspecified external cause status: Secondary | ICD-10-CM | POA: Insufficient documentation

## 2016-10-09 DIAGNOSIS — Y929 Unspecified place or not applicable: Secondary | ICD-10-CM | POA: Insufficient documentation

## 2016-10-09 DIAGNOSIS — Z87891 Personal history of nicotine dependence: Secondary | ICD-10-CM | POA: Insufficient documentation

## 2016-10-09 DIAGNOSIS — I10 Essential (primary) hypertension: Secondary | ICD-10-CM | POA: Insufficient documentation

## 2016-10-09 DIAGNOSIS — W228XXA Striking against or struck by other objects, initial encounter: Secondary | ICD-10-CM | POA: Insufficient documentation

## 2016-10-09 DIAGNOSIS — E119 Type 2 diabetes mellitus without complications: Secondary | ICD-10-CM | POA: Insufficient documentation

## 2016-10-09 DIAGNOSIS — Z7984 Long term (current) use of oral hypoglycemic drugs: Secondary | ICD-10-CM | POA: Insufficient documentation

## 2016-10-09 DIAGNOSIS — E039 Hypothyroidism, unspecified: Secondary | ICD-10-CM | POA: Insufficient documentation

## 2016-10-09 DIAGNOSIS — S6992XA Unspecified injury of left wrist, hand and finger(s), initial encounter: Secondary | ICD-10-CM

## 2016-10-09 NOTE — Discharge Instructions (Signed)
You have been seen today for a thumb injury. Your imaging showed no abnormalities. Follow up with PCP for any future management of this issue. Ibuprofen or naproxen for pain. Ice to reduce inflammation.

## 2016-10-09 NOTE — ED Triage Notes (Signed)
Patient states she hit her left thumb on table.  She has limited movement associated with pain.

## 2016-10-09 NOTE — ED Provider Notes (Signed)
[facility] DEPT Provider Note   CSN: [id number] Arrival date & time: 10/09/16  1240  By signing my name below, I, Soijett Blue, attest that this documentation has been prepared under the direction and in the presence of Monte Zinni, PA-C Electronically Signed: Soijett Blue, ED Scribe. 10/09/16. 1:36 PM.   History   Chief Complaint Chief Complaint  Patient presents with  . Finger Injury    left thumb    HPI Jenna [name] is a 55 y.o. female with a PMHx of DM, HTN, who presents to the Emergency Department complaining of left thumb injury occurring 2 days ago. Pt notes that she struck her left thumb on a table while ambulating and swinging her arms and would like to be evaluated. Pt reports that her left thumb pain is worsened with movement and alleviated with rest. She notes that she has tried aleve with no relief of her symptoms. She denies color change, wound, swelling, neuro deficits, and any other symptoms.    The history is provided by the patient. No language interpreter was used.    Past Medical History:  Diagnosis Date  . Diabetes ([place])   . Hypertension   . Seizures ([place])   . Thyroid disease     Patient Active Problem List   Diagnosis Date Noted  . Grief 04/23/2016  . DM type 2 (diabetes mellitus, type 2) ([place])   . Shoulder pain, right 12/14/2015  . Vaginal itching 12/14/2015  . Pain of right thumb 05/10/2015  . Abdominal pain, epigastric 05/10/2015  . Vertebral compression fracture ([place]) 12/18/2014  . Excessive cerumen in right ear canal 12/14/2014  . Right knee pain 08/16/2014  . Breast pain, left 07/28/2014  . LLQ abdominal pain 07/28/2014  . Neck pain on right side 05/17/2014  . Intertrigo 11/03/2013  . Right hand pain 10/20/2012  . Health care maintenance 10/20/2012  . Seizure ([place]) 04/07/2012  . Carpal tunnel syndrome on both sides 12/06/2011  . Plantar fasciitis 08/30/2011  . DeQuervain's disease (tenosynovitis) 04/22/2011  . Shoulder pain, left  04/19/2011  . Depressive illness 01/22/2011  . ARTHRALGIA 12/28/2010  . 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 Primary hyperparathyroidism ([place]) 05/25/2010  . HYPERTENSION, BENIGN 01/06/2009  . OBESITY, NOS 01/29/2007    Past Surgical History:  Procedure Laterality Date  . PARATHYROIDECTOMY  2010   primary hyperparpthyroidism  . TOTAL THYROIDECTOMY  2010   primary hyperparathyroidism  . uterine ablation  2009   menorrhagia and severe anemia    OB History    No data available       Home Medications    Prior to Admission medications   Medication Sig Start Date End Date Taking? Authorizing Provider  amLODipine (NORVASC) 10 MG tablet Take 1 tablet (10 mg total) by mouth daily. 12/18/15   Mercy Riding, MD  aspirin EC 81 MG EC tablet Take 1 tablet (81 mg total) by mouth daily. 12/18/15   Ashly Windell Moulding, DO  Blood Glucose Monitoring Suppl (ONE TOUCH ULTRA 2) w/Device KIT 1 Device by Does not apply route once. 12/21/15   Olam Idler, MD  divalproex (DEPAKOTE) 500 MG DR tablet Take 1 tablet (500 mg total) by mouth 2 (two) times daily. 05/08/16   Kathrynn Ducking, MD  glipiZIDE (GLUCOTROL) 10 MG tablet Take 1 tablet (10 mg total) by mouth 2 (two) times daily with a meal. 02/23/16   Zenia Resides, MD  glucose blood test strip Checked her blood sugar up to 3 times a day  as needed 12/21/15   Olam Idler, MD  ibuprofen (ADVIL,MOTRIN) 400 MG tablet Take 1 tablet (400 mg total) by mouth every 6 (six) hours as needed. 06/09/16   Lacretia Leigh, MD  ibuprofen (ADVIL,MOTRIN) 800 MG tablet Take 1 tablet (800 mg total) by mouth 3 (three) times daily. 06/30/16   Piper Hassebrock C Sereen Schaff, PA-C  Lancets (ONETOUCH ULTRASOFT) lancets Checked her blood sugar up to 3 times a day as needed 12/21/15   Olam Idler, MD  levothyroxine (SYNTHROID, LEVOTHROID) 125 MCG tablet TAKE ONE TABLET BY MOUTH ONCE DAILY BEFORE BREAKFAST 07/30/16   Steve Rattler, DO  lisinopril (PRINIVIL,ZESTRIL) 10 MG tablet Take 1 tablet (10  mg total) by mouth daily. 04/23/16   Olam Idler, MD  meloxicam (MOBIC) 7.5 MG tablet Take 1 tablet (7.5 mg total) by mouth daily. 09/05/16   Marjie Skiff, MD  metFORMIN (GLUCOPHAGE) 500 MG tablet Take 2 tablets (1,000 mg total) by mouth 2 (two) times daily with a meal. 02/05/16   Olam Idler, MD  methocarbamol (ROBAXIN) 500 MG tablet Take 1 tablet (500 mg total) by mouth 2 (two) times daily. 06/09/16   Lacretia Leigh, MD  naproxen (NAPROSYN) 500 MG tablet Take 1 tablet (500 mg total) by mouth 2 (two) times daily. 10/02/16   Margarita Mail, PA-C    Family History Family History  Problem Relation Age of Onset  . Diabetes Mother   . Heart attack Father   . Diabetes Brother     Social History Social History  Substance Use Topics  . Smoking status: Former Smoker    Types: Cigarettes    Quit date: 06/13/2010  . Smokeless tobacco: Never Used  . Alcohol use No     Allergies   Tramadol and Cyclobenzaprine   Review of Systems Review of Systems  Musculoskeletal: Positive for arthralgias (left thumb). Negative for joint swelling.  Skin: Negative for color change and wound.  Neurological: Negative for weakness and numbness.     Physical Exam Updated Vital Signs BP 153/96 (BP Location: Right Arm)   Pulse 70   Temp 98.3 F (36.8 C) (Oral)   Resp 20   Ht 5' 4"  (1.626 m)   Wt 237 lb (107.5 kg)   SpO2 99%   BMI 40.68 kg/m   Physical Exam  Constitutional: She appears well-developed and well-nourished. No distress.  HENT:  Head: Normocephalic and atraumatic.  Eyes: Conjunctivae are normal.  Neck: Neck supple.  Cardiovascular: Normal rate and regular rhythm.   Pulmonary/Chest: Effort normal.  Musculoskeletal:       Left hand: She exhibits tenderness. She exhibits normal range of motion, normal capillary refill and no swelling. Normal sensation noted. Normal strength noted.  Tender in the dorsum of left thumb. No swelling noted. Motor function intact at the MCP and DIP  joints. Cap refill is less than 2 seconds. NVI.   Neurological: She is alert.  Skin: Skin is warm and dry. Capillary refill takes less than 2 seconds. She is not diaphoretic.  Psychiatric: She has a normal mood and affect. Her behavior is normal.  Nursing note and vitals reviewed.    ED Treatments / Results  DIAGNOSTIC STUDIES: Oxygen Saturation is 99% on RA, nl by my interpretation.    COORDINATION OF CARE: 1:33 PM Discussed treatment plan with pt at bedside which includes left finger thumb xray, ice, and pt agreed to plan.   Radiology Dg Finger Thumb Left  Result Date: 10/09/2016 CLINICAL DATA:  Proximal left  thumb pain after recent left thumb injury. EXAM: LEFT THUMB 2+V COMPARISON:  None. FINDINGS: There is no evidence of fracture or dislocation. There is no evidence of arthropathy or other focal bone abnormality. Soft tissues are unremarkable IMPRESSION: Negative. Electronically Signed   By: Ilona Sorrel M.D.   On: 10/09/2016 13:37    Procedures Procedures (including critical care time)  Medications Ordered in ED Medications - No data to display   Initial Impression / Assessment and Plan / ED Course  I have reviewed the triage vital signs and the nursing notes.  Pertinent imaging results that were available during my care of the patient were reviewed by me and considered in my medical decision making (see chart for details).  Clinical Course     Patient presents with left thumb injury that occurred prior to arrival. No abnormalities on x-ray. No functional loss in the patient's hand or thumb. Velcro thumb spica for comfort. PCP follow-up. Return precautions discussed.     Final Clinical Impressions(s) / ED Diagnoses   Final diagnoses:  Injury of left thumb, initial encounter    New Prescriptions Discharge Medication List as of 10/09/2016  1:49 PM     I personally performed the services described in this documentation, which was scribed in my presence. The  recorded information has been reviewed and is accurate.     Lorayne Bender, PA-C 10/09/16 [place], MD 10/09/16 [id number]

## 2016-12-09 ENCOUNTER — Other Ambulatory Visit: Payer: Self-pay | Admitting: Family Medicine

## 2016-12-09 NOTE — Progress Notes (Signed)
   [facility] Phone: [phone]   Date of Visit: 12/10/2016   HPI:  Jenna [name] is a 56 y.o. female presenting to clinic today for same day appointment. PCP: Lovena NeighboursAbdoulaye Diallo, MD Concerns today include:   - reports of episodes of "blurr" that she describes as episodes where she would not respond normally, her vision would be blurred/hazy, and she would not respond properly (she would mumble). This has been occurring "for years" but has worsened since her husband passed away in March 14 2015.  - she reports she does not have jerking of her body during these episodes. She used to have urinary incontinence with this but that has resolved.  - her daughter reports that during these episodes she would be staring off and seems like "she is not listening to the conversation".  - She reports she has a history of seizures and is seen by neurology for this; she was last seen on 05/2016 and EEG was ordered. Her Depakote was increased in July (phone notes). Her EEG was normal in July. Patient was to follow up 4 months. She was not able to follow up with them because she did not have a copay - she also reports that she had anxiety. She feels that her anxiety is usually about her episodes. She also reports that it has been difficult living on her own since her husband died. Reports she has never lived by herself. She has not spoken with anyone regarding her grief after her husband's death. She would like to talk to behavioral health today. She reports she will also touch base with grief counseling through hospice. Denies HI/SI. She is not interested in pharmacologic therapy for anxiety. Has never been on medications for depression or anxiety. Has had depression in the past  GAD 7: 10 PHQ9 5  Last question: not difficult at all   ROS: See HPI.  PMFSH:  HTN DM2 Hypothyroidism  PHYSICAL EXAM: BP 132/60   Pulse 83   Temp 97.9 F (36.6 C) (Oral)   Ht 5\' 4"  (1.626 m)   Wt  240 lb (108.9 kg)   BMI 41.20 kg/m  GEN: NAD HEENT: Atraumatic, normocephalic, neck supple, EOMI, sclera clear  CV: RRR, no murmurs, rubs, or gallops PULM: CTAB, normal effort ABD: Soft, nontender, nondistended, NABS, no organomegaly SKIN: No rash or cyanosis; warm and well-perfused EXTR: No lower extremity edema or calf tenderness PSYCH: Mood and affect euthymic, normal rate and volume of speech NEURO: Awake, alert, no focal deficits, CN intact, normal strength and sensation to light touch in upper and lower extremities bilaterally, normal speech   ASSESSMENT/PLAN:  Grief Patient spoke with behavioral health today.   Seizure Followed by Neurology. Is on Depakote. Is describing episodes that she feels are seizures, but these are not tonic clonic. Neuro exam is normal.  Patient missed last visit with neurology. Encouraged patient to call neurology.   Follow up in 1 month with PCP regarding grief  Palma HolterKanishka G Aquan Kope, MD PGY 2 [facility]

## 2016-12-10 ENCOUNTER — Ambulatory Visit (INDEPENDENT_AMBULATORY_CARE_PROVIDER_SITE_OTHER): Payer: Self-pay | Admitting: Internal Medicine

## 2016-12-10 ENCOUNTER — Encounter: Payer: Self-pay | Admitting: Internal Medicine

## 2016-12-10 VITALS — BP 132/60 | HR 83 | Temp 97.9°F | Ht 64.0 in | Wt 240.0 lb

## 2016-12-10 DIAGNOSIS — F4321 Adjustment disorder with depressed mood: Secondary | ICD-10-CM

## 2016-12-10 DIAGNOSIS — F4320 Adjustment disorder, unspecified: Secondary | ICD-10-CM

## 2016-12-10 DIAGNOSIS — R569 Unspecified convulsions: Secondary | ICD-10-CM

## 2016-12-10 NOTE — Patient Instructions (Signed)
Please call the neurologist to mention the symptoms that you are having.   Let'ts get you in touch with behavioral health to discuss your mood. Follow up in 1 month.

## 2016-12-10 NOTE — Progress Notes (Signed)
Dr. Ottie GlazierGunadasa requested a Behavioral Health Consult.   Presenting Issue:  Grief  Report of symptoms:  Patient tearfully reported that she has been struggling with grief since her husband's death in April 2017. Patient is experiencing a lack of interest in activities and spends most of her time at home watching TV. Patient lives with her daughter, but is still adjusting to not having her husband with her. She spent 20 years with her husband and was in another relationship before that, and thus is feeling quite lonely now. She thinks often of her husband and wishes he was here to see their grandchildren growing up. Patient would like to enjoy herself again and do more during the day, but has had difficulty due to her low mood.   Assessment / Plan / Recommendations: Patient is struggling with grief following the loss of her husband in April 2017. She has ideas for things she would like to do but has difficulty motivating herself to do them. [facility] behavioral activation and helped patient set specific and attainable goals for activities. She plans to hang pictures in her house and work on Insurance account managerdesigning and organizing her room. She and her daughter are interested in finding a church to attend to feel more connected to her husband and to social support in the community. [facility] and will call in to schedule an appointment in one month. She will also consider attending grief counseling through hospice.

## 2016-12-11 NOTE — Assessment & Plan Note (Signed)
Followed by Neurology. Is on Depakote. Is describing episodes that she feels are seizures, but these are not tonic clonic. Neuro exam is normal.  Patient missed last visit with neurology. Encouraged patient to call neurology.

## 2016-12-11 NOTE — Assessment & Plan Note (Signed)
Patient spoke with behavioral health today.

## 2016-12-13 ENCOUNTER — Other Ambulatory Visit: Payer: Self-pay | Admitting: Family Medicine

## 2016-12-13 ENCOUNTER — Telehealth: Payer: Self-pay | Admitting: Family Medicine

## 2016-12-13 MED ORDER — DIVALPROEX SODIUM 250 MG PO DR TAB: 250.0000 mg | DELAYED_RELEASE_TABLET | Freq: Two times a day (BID) | ORAL | 2 refills | Status: DC

## 2016-12-13 NOTE — Telephone Encounter (Signed)
Pt has orange card.  Please send her depakote [facility] to [facility]

## 2016-12-14 NOTE — Telephone Encounter (Signed)
Prescription was [facility]ed to [facility]. Patient should be able to go pick up depakote prescription on Monday. Please let patient know.  Thank you Lovena NeighboursAbdoulaye Yarithza Mink, PGY-1

## 2016-12-16 NOTE — Telephone Encounter (Signed)
Pt informed of rx sent to [facility].

## 2016-12-17 ENCOUNTER — Other Ambulatory Visit: Payer: Self-pay | Admitting: Family Medicine

## 2016-12-20 ENCOUNTER — Other Ambulatory Visit: Payer: Self-pay | Admitting: Family Medicine

## 2016-12-20 DIAGNOSIS — I10 Essential (primary) hypertension: Secondary | ICD-10-CM

## 2016-12-20 NOTE — Telephone Encounter (Signed)
Please fax Rx for depokote and lisonpril to [facility]. [facility] said the depokote RX is not there.

## 2016-12-23 MED ORDER — DIVALPROEX SODIUM 250 MG PO DR TAB: 250.0000 mg | DELAYED_RELEASE_TABLET | Freq: Two times a day (BID) | ORAL | 2 refills | Status: AC

## 2016-12-23 MED ORDER — LISINOPRIL 10 MG PO TABS: 10.0000 mg | ORAL_TABLET | Freq: Every day | ORAL | 2 refills | Status: AC

## 2017-01-07 ENCOUNTER — Telehealth: Payer: Self-pay

## 2017-01-07 ENCOUNTER — Other Ambulatory Visit: Payer: Self-pay | Admitting: *Deleted

## 2017-01-07 NOTE — Telephone Encounter (Signed)
[facility] in. Did not leave message because name stating on voicemail did not sound like patient's name.

## 2017-01-07 NOTE — Telephone Encounter (Signed)
Patient called back and stated that she came to the office today because she thought she had an appointment with me. I apologized for the mix up and put her on my schedule for next Tuesday.

## 2017-01-08 NOTE — Telephone Encounter (Signed)
Pt needs a refill on thyroid medication. Pt uses [facility]. ep

## 2017-01-09 MED ORDER — LEVOTHYROXINE SODIUM 125 MCG PO TABS: 125.0000 ug | ORAL_TABLET | Freq: Every day | ORAL | 0 refills | Status: AC

## 2017-01-14 ENCOUNTER — Ambulatory Visit (INDEPENDENT_AMBULATORY_CARE_PROVIDER_SITE_OTHER): Payer: Self-pay | Admitting: Psychology

## 2017-01-14 DIAGNOSIS — F4321 Adjustment disorder with depressed mood: Secondary | ICD-10-CM

## 2017-01-14 DIAGNOSIS — F4320 Adjustment disorder, unspecified: Secondary | ICD-10-CM

## 2017-01-14 NOTE — Assessment & Plan Note (Signed)
Assessment/Plan/Recommendations: Patient's biggest complaint is about seizures and/or anxiety attacks that come out of the blue and make her feel anxious and scared. From a brief chart review, it looks like followup with Neurology was recommended but patient can't do this due to lack of insurance at this time. I'll follow up with her about this, as she has a meeting today with someone to discuss getting an orange card. Chart review also suggests that her seizures may be psychogenic but that this is still unclear.   For continued grief, patient would like to join a support group. She plans to call hospice about this today. We practiced deep breathing relaxation and I encouraged her to practice this several times a day. Patient also plans to try to get out of the house more. She will follow up with me in a month.

## 2017-01-14 NOTE — Patient Instructions (Addendum)
It was great seeing you today!  Try to practice deep breathing twice a day. Refer the handout or download one of the apps we talked about.  Schedule a time to call hospice about getting involved with a support group.   Your next appointment will be on March 13th at 11:45am.

## 2017-01-14 NOTE — Progress Notes (Signed)
Reason for follow-up:  Grief  Issues discussed:  [facility] goals patient made last time she was in clinic. Patient states that she was able to hang pictures in her room and feels good about that. She has not found a church but she and her daughter would both like to do this. I led patient and her daughter in thinking about what steps they would need to take to find a church they like.   Patient complains of seizures or anxiety attacks (she is unsure which it is). Described moments that come up out of the blue in which she goes "blank" and can hear others but cannot respond. Daughter stated that patient either stares blankly and doesn't respond to her during these moments, or patient mumbles. They described these moments as lasting a few seconds. Patient feels afraid afterward or like she is "going crazy." Patient denied heart racing, sweating, rapid breathing, or dizziness, though states she gets a "funny feeling" in her head before this happens. She states that she cannot follow up with Neurology because she no longer has her [employer] Card now that she [place]'t [place] an [place]. She [place] seeing someone in [place] today about these issues with her [employer] Card. These episodes were patient's main complaint today, though also complained of being bored, feeling down, and feeling sad about the loss of her husband.   We discussed ways for patient to get more active and involved, getting her involved with a grief support group, and deep breathing relaxation.

## 2017-02-11 ENCOUNTER — Ambulatory Visit: Payer: Self-pay

## 2017-04-07 ENCOUNTER — Other Ambulatory Visit: Payer: Self-pay | Admitting: *Deleted

## 2017-04-07 DIAGNOSIS — E119 Type 2 diabetes mellitus without complications: Secondary | ICD-10-CM

## 2017-04-07 DIAGNOSIS — E1122 Type 2 diabetes mellitus with diabetic chronic kidney disease: Secondary | ICD-10-CM

## 2017-04-07 MED ORDER — METFORMIN HCL 500 MG PO TABS: 1000.0000 mg | ORAL_TABLET | Freq: Two times a day (BID) | ORAL | 1 refills | Status: AC

## 2018-12-14 ENCOUNTER — Emergency Department (HOSPITAL_COMMUNITY)
Admission: EM | Admit: 2018-12-14 | Discharge: 2018-12-14 | Disposition: A | Discharge status: Home / Self Care | Payer: Self-pay | Attending: Emergency Medicine | Admitting: Emergency Medicine

## 2018-12-14 ENCOUNTER — Encounter (HOSPITAL_COMMUNITY): Payer: Self-pay

## 2018-12-14 ENCOUNTER — Other Ambulatory Visit: Payer: Self-pay

## 2018-12-14 DIAGNOSIS — W19XXXA Unspecified fall, initial encounter: Secondary | ICD-10-CM

## 2018-12-14 DIAGNOSIS — W1781XD Fall down embankment (hill), subsequent encounter: Secondary | ICD-10-CM | POA: Insufficient documentation

## 2018-12-14 DIAGNOSIS — Z87891 Personal history of nicotine dependence: Secondary | ICD-10-CM | POA: Insufficient documentation

## 2018-12-14 DIAGNOSIS — M25532 Pain in left wrist: Secondary | ICD-10-CM | POA: Insufficient documentation

## 2018-12-14 DIAGNOSIS — Z7982 Long term (current) use of aspirin: Secondary | ICD-10-CM | POA: Insufficient documentation

## 2018-12-14 DIAGNOSIS — E890 Postprocedural hypothyroidism: Secondary | ICD-10-CM | POA: Insufficient documentation

## 2018-12-14 DIAGNOSIS — Z79899 Other long term (current) drug therapy: Secondary | ICD-10-CM | POA: Insufficient documentation

## 2018-12-14 DIAGNOSIS — Z7984 Long term (current) use of oral hypoglycemic drugs: Secondary | ICD-10-CM | POA: Insufficient documentation

## 2018-12-14 DIAGNOSIS — E119 Type 2 diabetes mellitus without complications: Secondary | ICD-10-CM | POA: Insufficient documentation

## 2018-12-14 DIAGNOSIS — I10 Essential (primary) hypertension: Secondary | ICD-10-CM | POA: Insufficient documentation

## 2018-12-14 MED ORDER — HYDROCODONE-ACETAMINOPHEN 5-325 MG PO TABS: 1.0000 | ORAL_TABLET | Freq: Four times a day (QID) | ORAL | 0 refills | Status: AC | PRN

## 2018-12-14 NOTE — ED Provider Notes (Addendum)
[facility] EMERGENCY DEPARTMENT Provider Note   CSN: [id number] Arrival date & time: 12/14/18  1546     History   Chief Complaint Chief Complaint  Patient presents with  . Fall    HPI Jenna [name] is a 58 y.o. female.  HPI Patient slipped and fell down a muddy hill on 12/07/2018.  She injured left wrist, right knee, right ankle and left shoulder as a result of the fall.  She reports that pain began 1 day after falling.  She was seen at [facility] where she had x-rays of left wrist left shoulder and right knee all negative for fracture.  She continues to have pain.  She was treated with a splint and naproxen which has not adequately control pain.  No other associated symptoms.  Pain worse with movement.,  Improved with remaining still. Past Medical History:  Diagnosis Date  . Diabetes ([place])   . Hypertension   . Seizures ([place])   . Thyroid disease     Patient Active Problem List   Diagnosis Date Noted  . Grief 04/23/2016  . DM type 2 (diabetes mellitus, type 2) ([place])   . Shoulder pain, right 12/14/2015  . Vaginal itching 12/14/2015  . Pain of right thumb 05/10/2015  . Abdominal pain, epigastric 05/10/2015  . Vertebral compression fracture ([place]) 12/18/2014  . Excessive cerumen in right ear canal 12/14/2014  . Right knee pain 08/16/2014  . Breast pain, left 07/28/2014  . LLQ abdominal pain 07/28/2014  . Neck pain on right side 05/17/2014  . Intertrigo 11/03/2013  . Right hand pain 10/20/2012  . Health care maintenance 10/20/2012  . Seizure ([place]) 04/07/2012  . Carpal tunnel syndrome on both sides 12/06/2011  . Plantar fasciitis 08/30/2011  . DeQuervain's disease (tenosynovitis) 04/22/2011  . Shoulder pain, left 04/19/2011  . Depressive illness 01/22/2011  . ARTHRALGIA 12/28/2010  . 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 Primary hyperparathyroidism ([place]) 05/25/2010  . HYPERTENSION, BENIGN 01/06/2009  . OBESITY, NOS 01/29/2007      Past Surgical History:  Procedure Laterality Date  . PARATHYROIDECTOMY  2010   primary hyperparpthyroidism  . TOTAL THYROIDECTOMY  2010   primary hyperparathyroidism  . uterine ablation  2009   menorrhagia and severe anemia     OB History   No obstetric history on file.      Home Medications    Prior to Admission medications   Medication Sig Start Date End Date Taking? Authorizing Provider  amLODipine (NORVASC) 10 MG tablet Take 1 tablet (10 mg total) by mouth daily. 12/18/15   Mercy Riding, MD  aspirin EC 81 MG EC tablet Take 1 tablet (81 mg total) by mouth daily. 12/18/15   Janora Norlander, DO  Blood Glucose Monitoring Suppl (ONE TOUCH ULTRA 2) w/Device KIT 1 Device by Does not apply route once. 12/21/15   Olam Idler, MD  divalproex (DEPAKOTE) 250 MG DR tablet Take 1 tablet (250 mg total) by mouth 2 (two) times daily. 12/23/16   Diallo, Earna Coder, MD  divalproex (DEPAKOTE) 500 MG DR tablet Take 1 tablet (500 mg total) by mouth 2 (two) times daily. 05/08/16   Kathrynn Ducking, MD  glipiZIDE (GLUCOTROL) 10 MG tablet Take 1 tablet (10 mg total) by mouth 2 (two) times daily with a meal. 02/23/16   Hensel, Jamal Collin, MD  glucose blood test strip Checked her blood sugar up to 3 times a day as needed 12/21/15   Olam Idler, MD  HYDROcodone-acetaminophen (  NORCO) 5-325 MG tablet Take 1-2 tablets by mouth every 6 (six) hours as needed. 12/14/18   Orlie Dakin, MD  ibuprofen (ADVIL,MOTRIN) 400 MG tablet Take 1 tablet (400 mg total) by mouth every 6 (six) hours as needed. 06/09/16   Lacretia Leigh, MD  ibuprofen (ADVIL,MOTRIN) 800 MG tablet Take 1 tablet (800 mg total) by mouth 3 (three) times daily. 06/30/16   Joy, Shawn C, PA-C  Lancets (ONETOUCH ULTRASOFT) lancets Checked her blood sugar up to 3 times a day as needed 12/21/15   Olam Idler, MD  levothyroxine (SYNTHROID, LEVOTHROID) 125 MCG tablet Take 1 tablet (125 mcg total) by mouth daily before breakfast. 01/09/17   Diallo,  Earna Coder, MD  lisinopril (PRINIVIL,ZESTRIL) 10 MG tablet Take 1 tablet (10 mg total) by mouth daily. 12/23/16   Diallo, Earna Coder, MD  meloxicam (MOBIC) 7.5 MG tablet Take 1 tablet (7.5 mg total) by mouth daily. 09/05/16   Diallo, Earna Coder, MD  metFORMIN (GLUCOPHAGE) 500 MG tablet Take 2 tablets (1,000 mg total) by mouth 2 (two) times daily with a meal. 04/07/17   Diallo, Abdoulaye, MD  methocarbamol (ROBAXIN) 500 MG tablet Take 1 tablet (500 mg total) by mouth 2 (two) times daily. 06/09/16   Lacretia Leigh, MD  naproxen (NAPROSYN) 500 MG tablet Take 1 tablet (500 mg total) by mouth 2 (two) times daily. 10/02/16   Margarita Mail, PA-C    Family History Family History  Problem Relation Age of Onset  . Diabetes Mother   . Heart attack Father   . Diabetes Brother     Social History Social History   Tobacco Use  . Smoking status: Former Smoker    Types: Cigarettes    Last attempt to quit: 06/13/2010    Years since quitting: 8.5  . Smokeless tobacco: Never Used  Substance Use Topics  . Alcohol use: No  . Drug use: No     Allergies   Tramadol and Cyclobenzaprine   Review of Systems Review of Systems  Musculoskeletal: Positive for arthralgias.  Allergic/Immunologic: Positive for immunocompromised state.       Diabetic  All other systems reviewed and are negative.    Physical Exam Updated Vital Signs BP (!) 153/99 (BP Location: Right Arm)   Pulse 78   Temp 98.2 F (36.8 C) (Oral)   Resp 16   SpO2 96%   Physical Exam Vitals signs and nursing note reviewed.  Constitutional:      Appearance: Normal appearance. She is well-developed.  HENT:     Head: Normocephalic and atraumatic.  Eyes:     Conjunctiva/sclera: Conjunctivae normal.     Pupils: Pupils are equal, round, and reactive to light.  Neck:     Musculoskeletal: Neck supple.     Thyroid: No thyromegaly.     Trachea: No tracheal deviation.  Cardiovascular:     Rate and Rhythm: Normal rate and regular rhythm.      Heart sounds: No murmur.  Pulmonary:     Effort: Pulmonary effort is normal.     Breath sounds: Normal breath sounds.  Abdominal:     General: Bowel sounds are normal. There is no distension.     Palpations: Abdomen is soft.     Tenderness: There is no abdominal tenderness.  Musculoskeletal: Normal range of motion.        General: No tenderness.     Comments: Left upper extremity in Velcro splints.  Splint was removed to reveal no swelling.  She is mildly tender diffusely at  wrist to include anatomic snuffbox..  Shoulder elbow and forearm arm nontender.  Radial pulse 2+.  Digits with good capillary refill.   Right upper extremity well contusion abrasion or tenderness neurovascular intact. Right lower extremity there is no soft tissue swelling.  No bony tenderness.  Negative Thompson's test.  DP pulse 2+.  Good capillary refill.  Left lower extremity without contusion abrasion or tenderness neurovascular intact.  Skin:    General: Skin is warm and dry.     Findings: No rash.  Neurological:     General: No focal deficit present.     Mental Status: She is alert.     Coordination: Coordination normal.     Comments: Gait normal.  Walks without limp.      ED Treatments / Results  Labs (all labs ordered are listed, but only abnormal results are displayed) Labs Reviewed - No data to display  EKG None  Radiology No results found.  Procedures Procedures (including critical care time)  Medications Ordered in ED Medications - No data to display   Initial Impression / Assessment and Plan / ED Course  I have reviewed the triage vital signs and the nursing notes.  Pertinent labs & imaging results that were available during my care of the patient were reviewed by me and considered in my medical decision making (see chart for details).    X-ray reports available to me through care everywhere.  I am not able to image films.  I do not feel that patient needs to be reimaged or in need  of other x-ray studies.  She is in agreement.  I told her that they may be possibility of occult fracture not seen on initial x-ray.  Plan prescription [facility] Controlled Substance reporting System queried.  Suggest blood pressure recheck 3 weeks.  Of note she did not take her blood pressure medicine today  Final Clinical Impressions(s) / ED Diagnoses   Final diagnoses:  Fall, initial encounter  #2 elevated blood pressure  ED Discharge Orders         Ordered    HYDROcodone-acetaminophen (NORCO) 5-325 MG tablet  Every 6 hours PRN     12/14/18 1631           Orlie Dakin, MD 12/14/18 1639    Orlie Dakin, MD 12/14/18 1640

## 2018-12-14 NOTE — ED Notes (Signed)
ED Provider at bedside. 

## 2018-12-14 NOTE — Discharge Instructions (Addendum)
Keep your scheduled appointment with your orthopedist scheduled for 12/24/2018.  You can place an ice pack and hold over painful areas 4 times daily for 30 minutes at a time which will help with pain.  Your blood pressure medicine when you arrive home.  You should get your blood pressure rechecked at your doctor's office in the next 3 weeks.  Today's was elevated at 153/99

## 2018-12-14 NOTE — ED Triage Notes (Signed)
Pt reports mechanical fall last week, pt seen at hospital in [place]. Pt c.o pain in left shoulder and wrist and pain in her right knee and ankle. Pt had xrays on knee wrist and shoulder but not ankle. Pt has appt on 23rd with ortho but was told to come here if pain not any better, pt given naproxen at home.
